# Patient Record
Sex: Female | Born: 1945 | Race: White | Hispanic: No | Marital: Married | Smoking: Former smoker
Health system: Southern US, Community
[De-identification: ages and names within clinical notes are randomized; demographics above are authoritative.]

## PROBLEM LIST (undated history)

## (undated) DIAGNOSIS — Z90710 Acquired absence of both cervix and uterus: Secondary | ICD-10-CM

## (undated) DIAGNOSIS — Z8709 Personal history of other diseases of the respiratory system: Secondary | ICD-10-CM

## (undated) DIAGNOSIS — K602 Anal fissure, unspecified: Secondary | ICD-10-CM

## (undated) DIAGNOSIS — Z8619 Personal history of other infectious and parasitic diseases: Secondary | ICD-10-CM

## (undated) DIAGNOSIS — K635 Polyp of colon: Secondary | ICD-10-CM

## (undated) DIAGNOSIS — Z87828 Personal history of other (healed) physical injury and trauma: Secondary | ICD-10-CM

## (undated) DIAGNOSIS — I499 Cardiac arrhythmia, unspecified: Secondary | ICD-10-CM

## (undated) DIAGNOSIS — K219 Gastro-esophageal reflux disease without esophagitis: Secondary | ICD-10-CM

## (undated) DIAGNOSIS — K5790 Diverticulosis of intestine, part unspecified, without perforation or abscess without bleeding: Secondary | ICD-10-CM

## (undated) DIAGNOSIS — E079 Disorder of thyroid, unspecified: Secondary | ICD-10-CM

## (undated) DIAGNOSIS — F32A Depression, unspecified: Secondary | ICD-10-CM

## (undated) DIAGNOSIS — I480 Paroxysmal atrial fibrillation: Secondary | ICD-10-CM

## (undated) DIAGNOSIS — F419 Anxiety disorder, unspecified: Secondary | ICD-10-CM

## (undated) DIAGNOSIS — G2581 Restless legs syndrome: Secondary | ICD-10-CM

## (undated) DIAGNOSIS — E039 Hypothyroidism, unspecified: Secondary | ICD-10-CM

## (undated) DIAGNOSIS — M1990 Unspecified osteoarthritis, unspecified site: Secondary | ICD-10-CM

## (undated) DIAGNOSIS — K589 Irritable bowel syndrome without diarrhea: Secondary | ICD-10-CM

## (undated) DIAGNOSIS — Z780 Asymptomatic menopausal state: Secondary | ICD-10-CM

## (undated) DIAGNOSIS — E785 Hyperlipidemia, unspecified: Secondary | ICD-10-CM

## (undated) DIAGNOSIS — R011 Cardiac murmur, unspecified: Secondary | ICD-10-CM

## (undated) DIAGNOSIS — I872 Venous insufficiency (chronic) (peripheral): Secondary | ICD-10-CM

## (undated) DIAGNOSIS — G4733 Obstructive sleep apnea (adult) (pediatric): Secondary | ICD-10-CM

## (undated) DIAGNOSIS — Z9989 Dependence on other enabling machines and devices: Secondary | ICD-10-CM

## (undated) DIAGNOSIS — I4891 Unspecified atrial fibrillation: Secondary | ICD-10-CM

## (undated) DIAGNOSIS — M8580 Other specified disorders of bone density and structure, unspecified site: Secondary | ICD-10-CM

## (undated) DIAGNOSIS — F329 Major depressive disorder, single episode, unspecified: Secondary | ICD-10-CM

## (undated) HISTORY — DX: Unspecified osteoarthritis, unspecified site: M19.90

## (undated) HISTORY — DX: Depression, unspecified: F32.A

## (undated) HISTORY — PX: ENDOMETRIAL ABLATION: SHX621

## (undated) HISTORY — DX: Dependence on other enabling machines and devices: Z99.89

## (undated) HISTORY — DX: Acquired absence of both cervix and uterus: Z90.710

## (undated) HISTORY — DX: Personal history of other (healed) physical injury and trauma: Z87.828

## (undated) HISTORY — DX: Polyp of colon: K63.5

## (undated) HISTORY — DX: Anxiety disorder, unspecified: F41.9

## (undated) HISTORY — DX: Anal fissure, unspecified: K60.2

## (undated) HISTORY — DX: Unspecified atrial fibrillation: I48.91

## (undated) HISTORY — PX: BREAST CYST ASPIRATION: SHX578

## (undated) HISTORY — DX: Gastro-esophageal reflux disease without esophagitis: K21.9

## (undated) HISTORY — DX: Disorder of thyroid, unspecified: E07.9

## (undated) HISTORY — DX: Major depressive disorder, single episode, unspecified: F32.9

## (undated) HISTORY — DX: Diverticulosis of intestine, part unspecified, without perforation or abscess without bleeding: K57.90

## (undated) HISTORY — DX: Hyperlipidemia, unspecified: E78.5

## (undated) HISTORY — DX: Obstructive sleep apnea (adult) (pediatric): G47.33

## (undated) HISTORY — PX: BREAST SURGERY: SHX581

## (undated) HISTORY — DX: Other specified disorders of bone density and structure, unspecified site: M85.80

## (undated) HISTORY — DX: Paroxysmal atrial fibrillation: I48.0

---
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
## 1982-11-21 HISTORY — PX: VAGINAL HYSTERECTOMY: SUR661

## 1983-11-22 HISTORY — PX: BREAST CYST INCISION AND DRAINAGE: SHX14

## 1986-11-21 HISTORY — PX: TREATMENT FISTULA ANAL: SUR1390

## 2000-03-13 ENCOUNTER — Encounter: Payer: Self-pay | Admitting: Internal Medicine

## 2005-11-09 ENCOUNTER — Encounter: Payer: Self-pay | Admitting: Internal Medicine

## 2005-11-10 ENCOUNTER — Encounter: Payer: Self-pay | Admitting: Internal Medicine

## 2008-11-21 HISTORY — PX: COLONOSCOPY: SHX174

## 2009-02-02 ENCOUNTER — Encounter: Payer: Self-pay | Admitting: Internal Medicine

## 2009-02-02 ENCOUNTER — Ambulatory Visit: Payer: Self-pay | Admitting: Internal Medicine

## 2009-02-02 DIAGNOSIS — I4891 Unspecified atrial fibrillation: Secondary | ICD-10-CM

## 2009-02-02 DIAGNOSIS — E785 Hyperlipidemia, unspecified: Secondary | ICD-10-CM

## 2009-02-03 ENCOUNTER — Ambulatory Visit: Payer: Self-pay | Admitting: Internal Medicine

## 2009-02-03 LAB — CONVERTED CEMR LAB
Albumin: 3.9 g/dL (ref 3.5–5.2)
Alkaline Phosphatase: 46 units/L (ref 39–117)
Cholesterol: 201 mg/dL — ABNORMAL HIGH (ref 0–200)
Direct LDL: 111.1 mg/dL
HDL: 71.2 mg/dL (ref >39.00)
TSH: 3.23 microintl units/mL (ref 0.35–5.50)
Total CHOL/HDL Ratio: 3
Total Protein: 6.9 g/dL (ref 6.0–8.3)
VLDL: 17 mg/dL (ref 0.0–40.0)

## 2009-07-21 ENCOUNTER — Encounter (INDEPENDENT_AMBULATORY_CARE_PROVIDER_SITE_OTHER): Payer: Self-pay | Admitting: *Deleted

## 2009-08-06 ENCOUNTER — Encounter: Admission: RE | Admit: 2009-08-06 | Discharge: 2009-08-06 | Payer: Self-pay | Admitting: Obstetrics and Gynecology

## 2009-09-03 ENCOUNTER — Ambulatory Visit: Payer: Self-pay | Admitting: Internal Medicine

## 2009-09-04 ENCOUNTER — Ambulatory Visit: Payer: Self-pay | Admitting: Internal Medicine

## 2009-09-17 ENCOUNTER — Ambulatory Visit: Payer: Self-pay | Admitting: Internal Medicine

## 2009-09-17 ENCOUNTER — Encounter: Payer: Self-pay | Admitting: Internal Medicine

## 2009-09-21 ENCOUNTER — Encounter: Payer: Self-pay | Admitting: Internal Medicine

## 2009-09-23 ENCOUNTER — Encounter: Admission: RE | Admit: 2009-09-23 | Discharge: 2009-09-23 | Payer: Self-pay | Admitting: Obstetrics and Gynecology

## 2009-10-23 ENCOUNTER — Encounter: Admission: RE | Admit: 2009-10-23 | Discharge: 2009-10-23 | Payer: Self-pay | Admitting: Obstetrics and Gynecology

## 2010-03-01 ENCOUNTER — Encounter: Payer: Self-pay | Admitting: Internal Medicine

## 2010-03-09 ENCOUNTER — Telehealth (INDEPENDENT_AMBULATORY_CARE_PROVIDER_SITE_OTHER): Payer: Self-pay | Admitting: *Deleted

## 2010-08-17 ENCOUNTER — Encounter: Admission: RE | Admit: 2010-08-17 | Discharge: 2010-08-17 | Payer: Self-pay | Admitting: Obstetrics and Gynecology

## 2010-09-02 ENCOUNTER — Ambulatory Visit: Payer: Self-pay | Admitting: Internal Medicine

## 2010-09-02 ENCOUNTER — Encounter: Payer: Self-pay | Admitting: Internal Medicine

## 2010-09-15 ENCOUNTER — Telehealth: Payer: Self-pay | Admitting: Internal Medicine

## 2010-09-16 ENCOUNTER — Telehealth: Payer: Self-pay | Admitting: Internal Medicine

## 2010-09-23 ENCOUNTER — Ambulatory Visit: Payer: Self-pay

## 2010-09-23 ENCOUNTER — Ambulatory Visit: Payer: Self-pay | Admitting: Internal Medicine

## 2010-12-21 NOTE — Progress Notes (Signed)
Summary: refill   Phone Note Refill Request Call back at Home Phone [phone] Message from:  Patient on September 15, 2010 9:29 AM  Refills Requested: Medication #1:  RYTHMOL SR 325 MG XR12H-CAP one by mouth bid  Medication #2:  PRAVACHOL 80 MG TABS 1/2 tab once daily Pt need a [facility] [phone] for 10 pills  of Rythmol and pt also need a new prescription for 90 day supply for 1 year can pick up prescription  when ready for [facility] not [facility] for the pt.  Initial call taken by: Judie Grieve,  September 15, 2010 9:33 AM    Prescriptions: RYTHMOL SR 325 MG XR12H-CAP (PROPAFENONE HCL) one by mouth bid  #28 x 0   Entered by:   Laurance Flatten CMA   Authorized by:   Laren Boom, MD, [facility]   Signed by:   Laurance Flatten CMA on 09/15/2010   Method used:   [facility] to        [facility]  [phone]* (retail)       [address] [place], [place]  [address]       Ph: [phone] or [phone]       Fax: [phone]   RxID:   [id number] RYTHMOL SR 325 MG XR12H-CAP (PROPAFENONE HCL) one by mouth bid  #180 x 3   Entered by:   Laurance Flatten CMA   Authorized by:   Laren Boom, MD, [facility]   Signed by:   Laurance Flatten CMA on 09/15/2010   Method used:   Print then Give to Patient   RxID:   [id number] PRAVACHOL 80 MG TABS (PRAVASTATIN SODIUM) 1/2 tab once daily  #90 x 3   Entered by:   Laurance Flatten CMA   Authorized by:   Laren Boom, MD, [facility]   Signed by:   Laurance Flatten CMA on 09/15/2010   Method used:   Print then Give to Patient   RxID:   [id number]

## 2010-12-21 NOTE — Assessment & Plan Note (Signed)
Summary: yearly/sl  Medications Added RYTHMOL SR 325 MG XR12H-CAP (PROPAFENONE HCL) one by mouth bid PRAVACHOL 80 MG TABS (PRAVASTATIN SODIUM) 1/2 tab once daily VITAMIN C CR 1000 MG CR-TABS (ASCORBIC ACID) Take 1 tablet by mouth two times a day * MELATONIN 10 MG TABS (MELATONIN) 2 tablets at bedtime      Allergies Added:   Visit Type:  Follow-up Primary Frances Smith:  None   History of Present Illness: Frances Smith returns today for followup of her atrial fibrillation and dyslipidemia.  The patient has a h/o single PVI over 10 yrs ago.  She has done reasonably well on short acting propafenone until the past few months.  The patient has had difficulty forgetting her mid-day dose of drug and has noted increasingly frequent palpitations and symptomatic atrial fib.  No syncope.  She had a CRP study and this was elevated.  No c/p or sob or peripheral edema.  No other complaints today except for arthritis in her knees.  Current Medications (verified): 1)  Propafenone Hcl 225 Mg Tabs (Propafenone Hcl) .... Take 1 Tablet By Mouth Three Times A Day 2)  Pravachol 80 Mg Tabs (Pravastatin Sodium) .... 1/2 Tab Once Daily 3)  Aspirin 81 Mg Tbec (Aspirin) .... Two Times A Day 4)  Prilosec 20 Mg Cpdr (Omeprazole) .... Take 1 Tablet By Mouth Once A Day 5)  Multivitamin .... Take 1 Tablet By Mouth Once A Day 6)  Calcium 600/vitamin D 600-400 Mg-Unit Tabs (Calcium Carbonate-Vitamin D) .... Take 1 Tablet By Mouth Once A Day 7)  Coq10 100 Mg Caps (Coenzyme Q10) .... Take 1 Tablet By Mouth Once A Day 8)  Fish Oil 1000 Mg Caps (Omega-3 Fatty Acids) .... Take 1 Capsule By Mouth Two Times A Day 9)  Cinnamon 500 Mg Caps (Cinnamon) .... Take 1 Capsule By Mouth Two Times A Day 10)  Silymarin  Caps (Milk Thistle-Turmeric) .... Take 1 Capsule By Mouth Two Times A Day 11)  Vitamin C Cr 1000 Mg Cr-Tabs (Ascorbic Acid) .... Take 1 Tablet By Mouth Two Times A Day 12)  Vitamin B-12 1000 Mcg Tabs (Cyanocobalamin) .... Take  One Tablet By Mouth Once Daily. 13)  Osteo Bi-Flex Adv Triple St  Tabs (Misc Natural Products) .... 2 Tabs in Am   1 Tab in Pm 14)  Melatonin 10 Mg Tabs (Melatonin) .... 2 Tablets At Bedtime  Allergies (verified): 1)  ! Codeine  Past History:  Past Medical History: Last updated: 02/02/2009 Paroxysmal Atrial fibrillation controlled on Propofenone h/o Ablation in 1999.  Past Surgical History: Last updated: 02/02/2009 Hysterectomy 1984 Tonsillectomy 1949  Review of Systems  The patient denies chest pain, syncope, dyspnea on exertion, and peripheral edema.    Vital Signs:  Patient profile:   65 year old female Height:      62 inches Weight:      161 pounds BMI:     29.55 Pulse rate:   62 / minute BP sitting:   122 / 80  (left arm)  Vitals Entered By: Laurance Flatten CMA (September 02, 2010 11:27 AM)  Physical Exam  General:  Well developed, well nourished, in no acute distress. Head:  normocephalic and atraumatic Eyes:  PERRLA/EOM intact; conjunctiva and lids normal. Neck:  Neck supple, no JVD. No masses, thyromegaly or abnormal cervical nodes. Lungs:  Clear bilaterally to auscultation with no wheezes, rales, or rhonchi. Heart:  RRR with normal S1 and S2.  PMI is not enlarged and laterally displaced. Abdomen:  Bowel sounds  positive; abdomen soft and non-tender without masses, organomegaly, or hernias noted. No hepatosplenomegaly. Msk:  Back normal, normal gait. Muscle strength and tone normal. Pulses:  pulses normal in all 4 extremities Extremities:  No clubbing or cyanosis. Neurologic:  Alert and oriented x 3.   EKG  Procedure date:  09/02/2010  Findings:      Normal sinus rhythm with rate of:    EKG  Procedure date:  09/02/2010  Findings:      Rate is 70 with rare PAC's.  Impression & Recommendations:  Problem # 1:  ATRIAL FIBRILLATION (ICD-427.31) Her symptoms have worsened.  I have recommended we switch to Rythmol SR two times a day and I will schedule her  to undergo exercise testing. Her updated medication list for this problem includes:    Rythmol Sr 325 Mg Xr12h-cap (Propafenone hcl) ..... One by mouth bid    Aspirin 81 Mg Tbec (Aspirin) .Marland Kitchen..Marland Kitchen Two times a day  Orders: EKG w/ Interpretation (93000)  Problem # 2:  HYPERLIPIDEMIA-MIXED (ICD-272.4) she will continue her current meds. A low fat diet is recommended. Her updated medication list for this problem includes:    Pravachol 80 Mg Tabs (Pravastatin sodium) .Marland Kitchen... 1/2 tab once daily  Patient Instructions: 1)  Your physician recommends that you schedule a follow-up appointment in: 2-3 weeks GXT with Dr Ladona Ridgel 2)  Your physician has requested that you have an exercise tolerance test.  For further information please visit [email or web address]  Please also follow instruction sheet, as given. Prescriptions: PRAVACHOL 80 MG TABS (PRAVASTATIN SODIUM) 1/2 tab once daily  #90 x 3   Entered by:   Dennis Bast, RN, BSN   Authorized by:   Laren Boom, MD, [facility]   Signed by:   Dennis Bast, RN, BSN on 09/02/2010   Method used:   Faxed to ...       [facility] education [facility])       [address] [address]       [place], [place]  [address]       Ph: [phone]       Fax: [phone]   RxID:   [id number] RYTHMOL SR 325 MG XR12H-CAP (PROPAFENONE HCL) one by mouth bid  #180 x 3   Entered by:   Dennis Bast, RN, BSN   Authorized by:   Laren Boom, MD, [facility]   Signed by:   Dennis Bast, RN, BSN on 09/02/2010   Method used:   Faxed to ...       [facility] Environmental education officer)       [address] [address]       [place], [place]  [address]       Ph: [phone]       Fax: [phone]   RxID:   [id number]

## 2010-12-21 NOTE — Progress Notes (Signed)
Summary: PT CHECKING ON WRITTEN PRESCRIPTIONS   Phone Note Call from Patient Call back at [phone]   Summary of Call: PT CHECKING HER WRITTEN PRESCRIPTIONS Initial call taken by: Judie Grieve,  September 16, 2010 1:47 PM  Follow-up for Phone Call        pt called and rx's put out front Dennis Bast, RN, BSN  September 16, 2010 4:05 PM

## 2010-12-21 NOTE — Progress Notes (Signed)
Summary: Records request from [facility]  Request for records received from [facility]. Request forwarded to [facility]. Dena Chavis  March 09, 2010 2:16 PM

## 2010-12-21 NOTE — Miscellaneous (Signed)
Summary: GXT  Clinical Lists Changes  Orders: Added new Referral order of Treadmill (Treadmill) - Signed

## 2011-07-29 ENCOUNTER — Other Ambulatory Visit: Payer: Self-pay | Admitting: Obstetrics and Gynecology

## 2011-07-29 DIAGNOSIS — Z1231 Encounter for screening mammogram for malignant neoplasm of breast: Secondary | ICD-10-CM

## 2011-08-19 ENCOUNTER — Ambulatory Visit
Admission: RE | Admit: 2011-08-19 | Discharge: 2011-08-19 | Disposition: A | Discharge status: Home / Self Care | Payer: Medicare Other | Source: Ambulatory Visit | Attending: Obstetrics and Gynecology | Admitting: Obstetrics and Gynecology

## 2011-08-19 DIAGNOSIS — Z1231 Encounter for screening mammogram for malignant neoplasm of breast: Secondary | ICD-10-CM

## 2011-09-01 ENCOUNTER — Ambulatory Visit (INDEPENDENT_AMBULATORY_CARE_PROVIDER_SITE_OTHER): Payer: Medicare Other | Admitting: Internal Medicine

## 2011-09-01 ENCOUNTER — Encounter: Payer: Self-pay | Admitting: Internal Medicine

## 2011-09-01 DIAGNOSIS — I4891 Unspecified atrial fibrillation: Secondary | ICD-10-CM

## 2011-09-01 DIAGNOSIS — R079 Chest pain, unspecified: Secondary | ICD-10-CM

## 2011-09-01 DIAGNOSIS — R0789 Other chest pain: Secondary | ICD-10-CM

## 2011-09-01 NOTE — Progress Notes (Signed)
HPI Frances Smith Returns today for followup. She is a 65 year old woman with a history of paroxysmal atrial fibrillation, atrial flutter status post catheter ablation, chronic Propafenone therapy, and hypertension. The patient notes rare palpitations lasting just a few seconds. These occur once or twice a week. The patient's main complaint today is that of chest pain with exertion. When she walks vigorously or jogs, she has noted tightness in the chest which resolves with rest. There is no associated pain with it with sedentary activity. She has not had syncope. She denies peripheral edema. Her symptoms have been present for several weeks. Allergies  Allergen Reactions  . Codeine      Current Outpatient Prescriptions  Medication Sig Dispense Refill  . Ascorbic Acid (VITAMIN C) 1000 MG tablet Take 1,000 mg by mouth daily.        Marland Kitchen aspirin 162 MG EC tablet Take 162 mg by mouth 2 (two) times daily.        . Cholecalciferol (D3-1000) 1000 UNITS capsule Take 1,000 Units by mouth daily.        . Cinnamon 500 MG capsule Take 500 mg by mouth daily.        Marland Kitchen co-enzyme Q-10 50 MG capsule Take 100 mg by mouth daily.        . cyanocobalamin 2000 MCG tablet Take 2,000 mcg by mouth daily.        . fish oil-omega-3 fatty acids 1000 MG capsule Take 2 g by mouth daily.        . Milk Thistle-Turmeric (SILYMARIN PO) Take 80 mg by mouth daily.        . Misc Natural Products (HEALTHY LIVER PO) Take 1 tablet by mouth daily.        . Multiple Vitamins-Minerals (MULTIVITAMIN WITH MINERALS) tablet Take 1 tablet by mouth daily.        . NON FORMULARY Ashwagandha 400mg  twice a day       . NON FORMULARY Gamma Amino Butric Acid 180mg  daily       . Nutritional Supplements (MELATONIN PO) Take 6 mg by mouth daily.        Marland Kitchen omeprazole (PRILOSEC) 20 MG capsule Take 20 mg by mouth daily.        . pravastatin (PRAVACHOL) 40 MG tablet Take 40 mg by mouth daily.        . propafenone (RYTHMOL SR) 325 MG 12 hr capsule Take 325 mg  by mouth 2 (two) times daily.        . Turmeric, Curcuma Longa, POWD 400 mg by Does not apply route daily.           Past Medical History  Diagnosis Date  . Paroxysmal atrial fibrillation   . H/O: hysterectomy     ROS:   All systems reviewed and negative except as noted in the HPI.   Past Surgical History  Procedure Date  . Tonsilectomy, adenoidectomy, bilateral myringotomy and tubes      Family History  Problem Relation Age of Onset  . Heart failure Father 47     History   Social History  . Marital Status: Married    Spouse Name: N/A    Number of Children: N/A  . Years of Education: N/A   Occupational History  . retired    Social History Main Topics  . Smoking status: Never Smoker   . Smokeless tobacco: Not on file  . Alcohol Use: Not on file  . Drug Use: Yes  rare ETOH  . Sexually Active: Not on file   Other Topics Concern  . Not on file   Social History Narrative  . No narrative on file     BP 143/78  Pulse 65  Ht 5' 1.5" (1.562 m)  Wt 158 lb 6.4 oz (71.85 kg)  BMI 29.44 kg/m2  Physical Exam:  Well appearing NAD HEENT: Unremarkable Neck:  No JVD, no thyromegally Lymphatics:  No adenopathy Back:  No CVA tenderness Lungs:  Clear no wheezes, rales, or rhonchi. HEART:  Regular rate rhythm, no murmurs, no rubs, no clicks Abd:  soft, positive bowel sounds, no organomegally, no rebound, no guarding Ext:  2 plus pulses, no edema, no cyanosis, no clubbing Skin:  No rashes no nodules Neuro:  CN II through XII intact, motor grossly intact  EKG Normal sinus rhythm with sinus arrhythmia. Anteroseptal ST-T wave abnormality is present.  Assess/Plan:

## 2011-09-01 NOTE — Assessment & Plan Note (Signed)
Patient is maintaining sinus rhythm very nicely on her current antiarrhythmic drug therapy.                                           I recommended she continue her current medications.

## 2011-09-01 NOTE — Patient Instructions (Signed)
Your physician wants you to follow-up in: 12 months with Dr Court Joy will receive a reminder letter in the mail two months in advance. If you don't receive a letter, please call our office to schedule the follow-up appointment.   Your physician has requested that you have en exercise stress myoview. For further information please visit [email or web address] Please follow instruction sheet, as given.

## 2011-09-01 NOTE — Assessment & Plan Note (Signed)
The patient's symptoms are typical in that they occur with exertion and resolve with rest. She has an abnormal EKG at baseline. She is on antiarrhythmic drug therapy with a class IC antiarrhythmic drug. I recommended that she undergo exercise perfusion stress testing to better evaluate her exertional chest pain.

## 2011-09-21 ENCOUNTER — Ambulatory Visit (HOSPITAL_COMMUNITY): Payer: Medicare Other | Attending: Internal Medicine | Admitting: Radiology

## 2011-09-21 VITALS — Ht 61.5 in | Wt 158.0 lb

## 2011-09-21 DIAGNOSIS — I491 Atrial premature depolarization: Secondary | ICD-10-CM

## 2011-09-21 DIAGNOSIS — I4891 Unspecified atrial fibrillation: Secondary | ICD-10-CM

## 2011-09-21 DIAGNOSIS — R0789 Other chest pain: Secondary | ICD-10-CM

## 2011-09-21 DIAGNOSIS — R079 Chest pain, unspecified: Secondary | ICD-10-CM | POA: Insufficient documentation

## 2011-09-21 MED ORDER — TECHNETIUM TC 99M TETROFOSMIN IV KIT
33.0000 | PACK | Freq: Once | INTRAVENOUS | Status: AC | PRN
  Administered 2011-09-21: 33 via INTRAVENOUS

## 2011-09-21 MED ORDER — TECHNETIUM TC 99M TETROFOSMIN IV KIT
11.0000 | PACK | Freq: Once | INTRAVENOUS | Status: AC | PRN
  Administered 2011-09-21: 11 via INTRAVENOUS

## 2011-09-21 NOTE — Progress Notes (Signed)
[facility] SITE 3 NUCLEAR MED [address] [place] [address] [phone]  Cardiology Nuclear Med Study  Frances Smith is a 65 y.o. female [id number] 09-Jul-1946   Nuclear Med Background Indication for Stress Test:  Evaluation for Ischemia History: '99 Ablation, Abnormal EKG, 11/11 GXT: NL and AFIB Cardiac Risk Factors: Hypertension and Lipids  Symptoms:  Chest Pain, Chest Tightness and Palpitations   Nuclear Pre-Procedure Caffeine/Decaff Intake:  None NPO After: 8:30pm   Lungs: clear IV 0.9% NS with Angio Cath:  22g  IV Site: L Antecubital, tolerated well IV Started by:  Irean Hong, RN  Chest Size (in):  36 Cup Size: B  Height: 5' 1.5" (1.562 m)  Weight:  158 lb (71.668 kg)  BMI:  Body mass index is 29.37 kg/(m^2). Tech Comments:  N/A    Nuclear Med Study 1 or 2 day study: 1 day  Stress Test Type:  Stress  Reading MD: Arvilla Meres, MD  Order Authorizing Provider:  Lewayne Bunting, MD  Resting Radionuclide: Technetium 86m Tetrofosmin  Resting Radionuclide Dose: 11.0 mCi   Stress Radionuclide:  Technetium 41m Tetrofosmin  Stress Radionuclide Dose: 33.0 mCi           Stress Protocol Rest HR: 59 Stress HR: 141  Rest BP: 114/78 Stress BP: 163/72  Exercise Time (min): 7:30 METS: 9.3   Predicted Max HR: 153 bpm % Max HR: 92.16 bpm Rate Pressure Product: 25366   Dose of Adenosine (mg):  n/a Dose of Lexiscan: n/a mg  Dose of Atropine (mg): n/a Dose of Dobutamine: n/a mcg/kg/min (at max HR)  Stress Test Technologist: Milana Na, EMT-P  Nuclear Technologist:  Domenic Polite, CNMT     Rest Procedure:  Myocardial perfusion imaging was performed at rest 45 minutes following the intravenous administration of Technetium 57m Tetrofosmin. Rest ECG: Sinus Bradycardia  Stress Procedure:  The patient exercised for 7:30.  The patient stopped due to fatigue and denied any chest pain.  There were no significant ST-T wave changes.  Technetium 10m  Tetrofosmin was injected at peak exercise and myocardial perfusion imaging was performed after a brief delay. Stress ECG: No significant change from baseline ECG  QPS Raw Data Images:  Normal; no motion artifact; normal heart/lung ratio. Stress Images:  Normal homogeneous uptake in all areas of the myocardium. Rest Images:  Normal homogeneous uptake in all areas of the myocardium. Subtraction (SDS):  Normal Transient Ischemic Dilatation (Normal <1.22):  0.96 Lung/Heart Ratio (Normal <0.45):  0.30  Quantitative Gated Spect Images QGS EDV:  58 ml QGS ESV:  14 ml QGS cine images:  NL LV Function; NL Wall Motion QGS EF: 77%  Impression Exercise Capacity:  Fair exercise capacity. BP Response:  Normal blood pressure response. Clinical Symptoms:  No chest pain. ECG Impression:  No significant ST segment change suggestive of ischemia. Comparison with Prior Nuclear Study: No images to compare  Overall Impression:  Normal stress nuclear study.   Lititia Sen

## 2011-09-26 ENCOUNTER — Encounter: Payer: Self-pay | Admitting: Internal Medicine

## 2011-10-04 ENCOUNTER — Telehealth: Payer: Self-pay | Admitting: Internal Medicine

## 2011-10-04 NOTE — Telephone Encounter (Signed)
Advised patient

## 2011-10-04 NOTE — Telephone Encounter (Signed)
Since stress test looks normal, what is the plan from here.  She had been having chest pains and pressure when jogging up hill, she has stopped that.  She does want to know if anything more needs to be done. Advised would forward to [employer] for him to discuss with Dr Ladona Ridgel on Friday when he is back.

## 2011-10-04 NOTE — Telephone Encounter (Signed)
Pt wants to know stress test results please call °

## 2011-10-07 NOTE — Telephone Encounter (Signed)
Stress test ok and Dr Ladona Ridgel will see her in 6-8 weeks She is in [facility] and will call us when she returns home to schedule

## 2011-10-18 DIAGNOSIS — E039 Hypothyroidism, unspecified: Secondary | ICD-10-CM | POA: Insufficient documentation

## 2011-11-25 DIAGNOSIS — I8393 Asymptomatic varicose veins of bilateral lower extremities: Secondary | ICD-10-CM | POA: Insufficient documentation

## 2011-11-25 DIAGNOSIS — M8580 Other specified disorders of bone density and structure, unspecified site: Secondary | ICD-10-CM | POA: Insufficient documentation

## 2011-11-25 DIAGNOSIS — K635 Polyp of colon: Secondary | ICD-10-CM | POA: Insufficient documentation

## 2011-11-30 ENCOUNTER — Encounter: Payer: Self-pay | Admitting: Internal Medicine

## 2011-11-30 ENCOUNTER — Ambulatory Visit (INDEPENDENT_AMBULATORY_CARE_PROVIDER_SITE_OTHER): Payer: Medicare Other | Admitting: Internal Medicine

## 2011-11-30 DIAGNOSIS — E785 Hyperlipidemia, unspecified: Secondary | ICD-10-CM

## 2011-11-30 DIAGNOSIS — I4891 Unspecified atrial fibrillation: Secondary | ICD-10-CM

## 2011-11-30 NOTE — Progress Notes (Signed)
HPI Frances Smith returns today for followup. She is a 66 year old woman with a history of recurrent and paroxysmal atrial arrhythmias. These have been well controlled with Rythmol. The patient has had very minimal palpitations in the last year. She denies chest pain or shortness of breath. She does admit to inactivity. Allergies  Allergen Reactions  . Codeine      Current Outpatient Prescriptions  Medication Sig Dispense Refill  . Ascorbic Acid (VITAMIN C) 1000 MG tablet Take 1,000 mg by mouth daily.        Marland Kitchen aspirin 162 MG EC tablet Take 162 mg by mouth 2 (two) times daily.        Marland Kitchen b complex vitamins capsule Take 1 capsule by mouth daily.      . Cholecalciferol (D3-1000) 1000 UNITS capsule Take 1,000 Units by mouth daily.        . Cinnamon 500 MG capsule Take 500 mg by mouth daily.        Marland Kitchen co-enzyme Q-10 50 MG capsule Take 100 mg by mouth daily.        . cyanocobalamin 2000 MCG tablet Take 2,000 mcg by mouth daily.        Marland Kitchen escitalopram (LEXAPRO) 20 MG tablet Take 20 mg by mouth daily.      . fish oil-omega-3 fatty acids 1000 MG capsule Take 2 g by mouth daily.        . Milk Thistle-Turmeric (SILYMARIN PO) Take 80 mg by mouth daily.        . Misc Natural Products (HEALTHY LIVER PO) Take 1 tablet by mouth daily.        . Multiple Vitamins-Minerals (MULTIVITAMIN WITH MINERALS) tablet Take 1 tablet by mouth daily.        . NON FORMULARY Ashwagandha 400mg  twice a day       . NON FORMULARY Gamma Amino Butric Acid 180mg  daily       . Nutritional Supplements (MELATONIN PO) Take 6 mg by mouth daily.        Marland Kitchen omeprazole (PRILOSEC) 20 MG capsule Take 20 mg by mouth daily.        . pravastatin (PRAVACHOL) 40 MG tablet Take 40 mg by mouth daily.        . propafenone (RYTHMOL SR) 325 MG 12 hr capsule Take 325 mg by mouth 2 (two) times daily.        . Turmeric, Curcuma Longa, POWD 400 mg by Does not apply route daily.           Past Medical History  Diagnosis Date  . Paroxysmal atrial  fibrillation   . H/O: hysterectomy     ROS:   All systems reviewed and negative except as noted in the HPI.   Past Surgical History  Procedure Date  . Tonsilectomy, adenoidectomy, bilateral myringotomy and tubes      Family History  Problem Relation Age of Onset  . Heart failure Father 10     History   Social History  . Marital Status: Married    Spouse Name: N/A    Number of Children: N/A  . Years of Education: N/A   Occupational History  . retired    Social History Main Topics  . Smoking status: Never Smoker   . Smokeless tobacco: Not on file  . Alcohol Use: Not on file  . Drug Use: Yes     rare ETOH  . Sexually Active: Not on file   Other Topics Concern  . Not on  file   Social History Narrative  . No narrative on file     BP 112/74  Pulse 64  Ht 5' 1.5" (1.562 m)  Wt 74.118 kg (163 lb 6.4 oz)  BMI 30.37 kg/m2  Physical Exam:  Well appearing middle aged woman, NAD HEENT: Unremarkable Neck:  No JVD, no thyromegally Lungs:  Clear with no wheezes, rales, or rhonchi. HEART:  Regular rate rhythm, no murmurs, no rubs, no clicks Abd:  soft, positive bowel sounds, no organomegally, no rebound, no guarding Ext:  2 plus pulses, no edema, no cyanosis, no clubbing Skin:  No rashes no nodules Neuro:  CN II through XII intact, motor grossly intact    Assess/Plan:

## 2011-11-30 NOTE — Assessment & Plan Note (Signed)
She will continue her statin therapy and maintain a low-fat diet. I have encouraged her to increase her physical activity

## 2011-11-30 NOTE — Patient Instructions (Signed)
Your physician wants you to follow-up in: 12 months with Dr. Taylor. You will receive a reminder letter in the mail two months in advance. If you don't receive a letter, please call our office to schedule the follow-up appointment.    

## 2011-11-30 NOTE — Assessment & Plan Note (Signed)
Her symptoms have been well controlled with Rythmol. She will continue her current medical therapy.

## 2012-06-01 ENCOUNTER — Other Ambulatory Visit: Payer: Self-pay | Admitting: Obstetrics and Gynecology

## 2012-06-01 DIAGNOSIS — Z1231 Encounter for screening mammogram for malignant neoplasm of breast: Secondary | ICD-10-CM

## 2012-06-04 ENCOUNTER — Other Ambulatory Visit: Payer: Self-pay | Admitting: Family Medicine

## 2012-06-04 DIAGNOSIS — M8580 Other specified disorders of bone density and structure, unspecified site: Secondary | ICD-10-CM

## 2012-06-22 ENCOUNTER — Other Ambulatory Visit: Payer: Medicare Other

## 2012-06-28 ENCOUNTER — Ambulatory Visit
Admission: RE | Admit: 2012-06-28 | Discharge: 2012-06-28 | Disposition: A | Discharge status: Home / Self Care | Payer: Medicare Other | Source: Ambulatory Visit | Attending: Family Medicine | Admitting: Family Medicine

## 2012-06-28 DIAGNOSIS — M8580 Other specified disorders of bone density and structure, unspecified site: Secondary | ICD-10-CM

## 2012-07-03 ENCOUNTER — Encounter: Payer: Self-pay | Admitting: *Deleted

## 2012-07-04 ENCOUNTER — Encounter: Payer: Self-pay | Admitting: Pulmonary Disease

## 2012-07-04 ENCOUNTER — Ambulatory Visit (INDEPENDENT_AMBULATORY_CARE_PROVIDER_SITE_OTHER): Payer: Medicare Other | Admitting: Pulmonary Disease

## 2012-07-04 VITALS — BP 118/82 | HR 88 | Temp 99.3°F | Ht 61.0 in | Wt 161.6 lb

## 2012-07-04 DIAGNOSIS — G4733 Obstructive sleep apnea (adult) (pediatric): Secondary | ICD-10-CM

## 2012-07-04 NOTE — Progress Notes (Signed)
  Subjective:    Patient ID: Frances Smith, female    DOB: 01-Jul-1946, 66 y.o.   MRN: [id number]  HPI The patient is a 66 year old female who had been asked to see for possible obstructive sleep apnea.  She states that she has been snoring for over 10 years, but most recently it has been getting progressively worse.  It has gotten to the point that her husband has moved to a different bedroom.  Prior to this, he has not commented on an abnormal breathing pattern during sleep.  However, the patient has noticed one episode of a gasping arousal most recently.  She has frequent awakenings during the night, and feels tired upon arising in the mornings.  She does have some sleep pressured during the day with any activity, and can easily take an afternoon nap.  She also notes sleep pressure in the evening if she sits down for any period of time.  She denies any sleepiness with driving.  The patient states that her weight is up about 10 pounds over the last few years, and her Epworth sleepiness score today is abnormal at 14.  Sleep Questionnaire: What time do you typically go to bed?( Between what hours) 10-11:30 pm How long does it take you to fall asleep? varies but generally 1-2 hours How many times during the night do you wake up? 2 What time do you get out of bed to start your day? 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 (In pounds) 10 lb (4.536 kg) Have you ever had a sleep study before? No Do you currently use CPAP? No Do you wear oxygen at any time? No    Review of Systems  Constitutional: Negative for fever and unexpected weight change.  HENT: Positive for sneezing and postnasal drip. Negative for ear pain, nosebleeds, congestion, sore throat, rhinorrhea, trouble swallowing, dental problem and sinus pressure.   Eyes: Negative for redness and itching.  Respiratory: Positive for cough. Negative for chest  tightness, shortness of breath and wheezing.   Cardiovascular: Positive for palpitations and leg swelling.  Gastrointestinal: Negative for nausea and vomiting.  Genitourinary: Negative for dysuria.  Musculoskeletal: Positive for joint swelling.  Skin: Negative for rash.  Neurological: Negative for headaches.  Hematological: Bruises/bleeds easily.  Psychiatric/Behavioral: Negative for dysphoric mood. The patient is nervous/anxious.        Objective:   Physical Exam Constitutional:  Well developed, no acute distress  HENT:  Nares patent without discharge, septal deviation to left.   Oropharynx without exudate, palate and uvula are mildly elongated.   Eyes:  Perrla, eomi, no scleral icterus  Neck:  No JVD, no TMG  Cardiovascular:  Normal rate, regular rhythm, no rubs or gallops. 1/6 sem        Intact distal pulses  Pulmonary :  Normal breath sounds, no stridor or respiratory distress   No rales, rhonchi, or wheezing  Abdominal:  Soft, nondistended, bowel sounds present.  No tenderness noted.   Musculoskeletal:  No lower extremity edema noted.  Lymph Nodes:  No cervical lymphadenopathy noted  Skin:  No cyanosis noted  Neurologic:  Alert, appropriate, moves all 4 extremities without obvious deficit.         Assessment & Plan:

## 2012-07-04 NOTE — Assessment & Plan Note (Signed)
The patient is complaining of progressive snoring over the years, and now gives a history that suggests some degree of sleep disordered breathing.  I've had a long discussion with her about sleep apnea, including its impact to her quality of life and cardiovascular health.  Given her worsening symptoms, I have recommended proceeding with a sleep study for diagnosis.  The patient is agreeable.

## 2012-07-04 NOTE — Patient Instructions (Addendum)
Will schedule for a sleep study, and arrange followup once the results are available.  

## 2012-07-13 DIAGNOSIS — M25561 Pain in right knee: Secondary | ICD-10-CM | POA: Insufficient documentation

## 2012-07-18 ENCOUNTER — Ambulatory Visit (HOSPITAL_BASED_OUTPATIENT_CLINIC_OR_DEPARTMENT_OTHER): Payer: Medicare Other | Attending: Pulmonary Disease | Admitting: Radiology

## 2012-07-18 VITALS — Ht 61.0 in | Wt 159.0 lb

## 2012-07-18 DIAGNOSIS — G4730 Sleep apnea, unspecified: Secondary | ICD-10-CM | POA: Insufficient documentation

## 2012-07-18 DIAGNOSIS — G4733 Obstructive sleep apnea (adult) (pediatric): Secondary | ICD-10-CM

## 2012-07-18 DIAGNOSIS — G4710 Hypersomnia, unspecified: Secondary | ICD-10-CM | POA: Insufficient documentation

## 2012-07-25 DIAGNOSIS — G4710 Hypersomnia, unspecified: Secondary | ICD-10-CM

## 2012-07-25 DIAGNOSIS — G4730 Sleep apnea, unspecified: Secondary | ICD-10-CM

## 2012-07-26 ENCOUNTER — Telehealth: Payer: Self-pay | Admitting: Pulmonary Disease

## 2012-07-26 NOTE — Telephone Encounter (Signed)
Appt set to review sleep results on 07-30-12. Carron Curie, CMA

## 2012-07-26 NOTE — Procedures (Signed)
Frances Smith, Frances Smith                ACCOUNT NO.:  [id number]  MEDICAL RECORD NO.:  [id number]          PATIENT TYPE:  OUT  LOCATION:  SLEEP CENTER                 FACILITY:  [facility]  PHYSICIAN:  Barbaraann Share, MD,FCCPDATE OF BIRTH:  1946-08-[birth year]  DATE OF STUDY:  07/18/2012                           NOCTURNAL POLYSOMNOGRAM  REFERRING PHYSICIAN:  Barbaraann Share, MD,FCCP  INDICATION FOR STUDY:  Hypersomnia with sleep apnea.  EPWORTH SLEEPINESS SCORE:  12.  MEDICATIONS:  SLEEP ARCHITECTURE:  The patient had a total sleep time of 209 minutes with no slow-wave sleep and only 11 minutes of REM.  Sleep onset latency was prolonged at 86 minutes and REM onset was prolonged at 130 minutes. Sleep efficiency was poor at 51%.  RESPIRATORY DATA:  The patient was found to have no obstructive apneas or hypopneas, and only mild snoring was noted.  OXYGEN DATA:  There was transient O2 desaturation as low as 90% during the night.  CARDIAC DATA:  No clinically significant arrhythmias were noted.  MOVEMENT-PARASOMNIA:  The patient had 366 periodic limb movements, with 6 per hour resulting in arousal or awakening.  There were no abnormal behaviors noted.  IMPRESSIONS-RECOMMENDATIONS: 1. No clinically significant sleep-disordered breathing was noted     during the study.  The patient did have a short total sleep time     and very little slow-wave sleep or REM, however she had ample     opportunity to exhibit sleep-disordered breathing. 2. Large numbers of periodic limb movements with significant sleep     disruption.  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    Barbaraann Share, MD,FCCP Diplomate, American Board of Sleep Medicine   [facility]  D:  07/25/2012 08:29:26  T:  07/26/2012 01:09:37  Job:  [id number]

## 2012-07-30 ENCOUNTER — Ambulatory Visit (INDEPENDENT_AMBULATORY_CARE_PROVIDER_SITE_OTHER): Payer: Medicare Other | Admitting: Pulmonary Disease

## 2012-07-30 ENCOUNTER — Encounter: Payer: Self-pay | Admitting: Pulmonary Disease

## 2012-07-30 VITALS — BP 122/72 | HR 65 | Ht 61.0 in | Wt 160.8 lb

## 2012-07-30 DIAGNOSIS — G2581 Restless legs syndrome: Secondary | ICD-10-CM

## 2012-07-30 MED ORDER — ROPINIROLE HCL 0.5 MG PO TABS: 0.5000 mg | ORAL_TABLET | Freq: Three times a day (TID) | ORAL | Status: DC

## 2012-07-30 NOTE — Assessment & Plan Note (Signed)
The patient's sleep study shows very large numbers of limb movements with significant sleep disruption.  On further questioning, the patient does have kicking at night, and describes classic RLS symptoms in the evening.  This may be responsible for her frequent awakenings at night, and some of her symptoms during the day.  I would like to try her on a dopamine agonist to see if it helps, and she is agreeable.

## 2012-07-30 NOTE — Progress Notes (Signed)
  Subjective:    Patient ID: Frances Smith, female    DOB: June 27, 1946, 66 y.o.   MRN: [id number]  HPI The patient comes in today for followup after her recent sleep study.  She was found to have no apneas or hypopneas, and only mild snoring.  However, she was found to have 366 periodic limb movements, with 6 per hour resulting in arousals or awakenings.  The patient has been told that she does kick during the night, and also has classic restless leg symptoms in the evenings before going to bed.  She has had this since early adulthood.   Review of Systems  Constitutional: Negative for fever and unexpected weight change.  HENT: Positive for congestion, rhinorrhea and sinus pressure. Negative for ear pain, nosebleeds, sore throat, sneezing, trouble swallowing, dental problem and postnasal drip.   Eyes: Negative for redness and itching.  Respiratory: Positive for cough. Negative for chest tightness, shortness of breath and wheezing.   Cardiovascular: Negative for palpitations and leg swelling.  Gastrointestinal: Negative for nausea and vomiting.  Genitourinary: Negative for dysuria.  Musculoskeletal: Negative for joint swelling.  Skin: Negative for rash.  Neurological: Negative for headaches.  Hematological: Bruises/bleeds easily.  Psychiatric/Behavioral: Positive for dysphoric mood. The patient is nervous/anxious.        Objective:   Physical Exam Well-developed female in no acute distress Nose without purulent or discharge noted Lower extremities without edema, no cyanosis Alert, does not appear to be sleepy, moves all 4 extremities.       Assessment & Plan:

## 2012-07-30 NOTE — Patient Instructions (Addendum)
Will start on requip 0.5mg  one after dinner each night.  After a week, if not better or if only partially better, can increase to 2 after dinner. Please call me in 3 weeks with your response to therapy. Work on weight loss, stay off back as much as possible for your snoring.

## 2012-08-22 ENCOUNTER — Ambulatory Visit
Admission: RE | Admit: 2012-08-22 | Discharge: 2012-08-22 | Disposition: A | Discharge status: Home / Self Care | Payer: Medicare Other | Source: Ambulatory Visit | Attending: Obstetrics and Gynecology | Admitting: Obstetrics and Gynecology

## 2012-08-22 DIAGNOSIS — Z1231 Encounter for screening mammogram for malignant neoplasm of breast: Secondary | ICD-10-CM

## 2012-08-24 DIAGNOSIS — F411 Generalized anxiety disorder: Secondary | ICD-10-CM | POA: Insufficient documentation

## 2012-08-28 ENCOUNTER — Other Ambulatory Visit: Payer: Self-pay | Admitting: Internal Medicine

## 2012-11-05 ENCOUNTER — Other Ambulatory Visit: Payer: Self-pay | Admitting: Dermatology

## 2012-11-20 ENCOUNTER — Encounter: Payer: Self-pay | Admitting: Internal Medicine

## 2012-11-20 ENCOUNTER — Ambulatory Visit (INDEPENDENT_AMBULATORY_CARE_PROVIDER_SITE_OTHER): Payer: Medicare Other | Admitting: Internal Medicine

## 2012-11-20 VITALS — BP 128/66 | HR 72 | Ht 61.0 in | Wt 171.0 lb

## 2012-11-20 DIAGNOSIS — I4891 Unspecified atrial fibrillation: Secondary | ICD-10-CM

## 2012-11-20 NOTE — Patient Instructions (Signed)
Your physician wants you to follow-up in: 6 months with Dr Taylor You will receive a reminder letter in the mail two months in advance. If you don't receive a letter, please call our office to schedule the follow-up appointment.  

## 2012-11-21 ENCOUNTER — Encounter: Payer: Self-pay | Admitting: Internal Medicine

## 2012-11-21 NOTE — Progress Notes (Signed)
HPI Frances Smith returns today for followup. She is a pleasant 67 yo woman with a h/o atrial flutter, s/p ablation, PAF which has been well controlled on Propafenone. She notes one episode of sustained palpitations several days ago after taking a tablespoon of Tumeric. Other than that she has had no chest pain or sob. She has had problems with arthritis and has been unable to exercise and has gained 30 lbs. Allergies  Allergen Reactions  . Codeine      Current Outpatient Prescriptions  Medication Sig Dispense Refill  . Ascorbic Acid (VITAMIN C) 1000 MG tablet Take 1,000 mg by mouth daily.        Marland Kitchen aspirin 162 MG EC tablet Take 162 mg by mouth 2 (two) times daily.        Marland Kitchen b complex vitamins capsule Take 1 capsule by mouth daily.      . Calcium-Vitamin D 600-125 MG-UNIT TABS Take 2 tablets by mouth 2 (two) times daily.      . Cholecalciferol (D3-1000) 1000 UNITS capsule Take 2,000 Units by mouth daily.       . Cinnamon 500 MG capsule Take 500 mg by mouth daily.        Marland Kitchen co-enzyme Q-10 50 MG capsule Take 100 mg by mouth daily.        Marland Kitchen escitalopram (LEXAPRO) 20 MG tablet Take 20 mg by mouth daily.      . fish oil-omega-3 fatty acids 1000 MG capsule Take 2 g by mouth daily.        . Melatonin 1 MG TABS Take 6 mg by mouth daily.      . Misc Natural Products (HEALTHY LIVER PO) Take 1 tablet by mouth daily.        . Misc Natural Products (OSTEO BI-FLEX ADV JOINT SHIELD PO) Take 2 tablets by mouth 2 (two) times daily.      . Multiple Vitamins-Minerals (MULTIVITAMIN WITH MINERALS) tablet Take 1 tablet by mouth daily.        . pantoprazole (PROTONIX) 40 MG tablet Take 40 mg by mouth daily.      . pravastatin (PRAVACHOL) 40 MG tablet Take 40 mg by mouth daily.        Marland Kitchen rOPINIRole (REQUIP) 1 MG tablet Take 1 mg by mouth at bedtime.      Marland Kitchen RYTHMOL SR 325 MG 12 hr capsule TAKE 1 CAPSULE BY MOUTH EVERY 12 HOURS  180 capsule  3  . thyroid (ARMOUR) 30 MG tablet Take 30 mg by mouth daily.      . Turmeric  500 MG CAPS Take 1 capsule by mouth daily.         Past Medical History  Diagnosis Date  . Paroxysmal atrial fibrillation   . H/O: hysterectomy   . Anxiety   . Depression   . Thyroid disease   . Hyperlipidemia   . GERD (gastroesophageal reflux disease)   . History of head injury     ROS:   All systems reviewed and negative except as noted in the HPI.   Past Surgical History  Procedure Date  . Tonsillectomy   . Adenoidectomy   . Endometrial ablation   . Colonoscopy   . Vaginal hysterectomy   . Incision and drainage abscess anal      Family History  Problem Relation Age of Onset  . Heart failure Father 43  . Thyroid disease Brother   . Anxiety disorder Daughter   . Depression Daughter   . Anxiety  disorder Son   . Depression Son   . Anxiety disorder Brother   . Allergies Mother   . Asthma Mother   . Breast cancer Mother      History   Social History  . Marital Status: Married    Spouse Name: N/A    Number of Children: 2  . Years of Education: N/A   Occupational History  . retired     Charity fundraiser, Teacher, adult education   Social History Main Topics  . Smoking status: Former Smoker -- 0.5 packs/day for 4 years    Types: Cigarettes    Quit date: 11/21/1964  . Smokeless tobacco: Not on file  . Alcohol Use: No     Comment: rare ETOH  . Drug Use: No  . Sexually Active: Not on file   Other Topics Concern  . Not on file   Social History Narrative  . No narrative on file     BP 128/66  Pulse 72  Ht 5\' 1"  (1.549 m)  Wt 171 lb (77.565 kg)  BMI 32.31 kg/m2  Physical Exam:  Well appearing middle age woman, NAD HEENT: Unremarkable Neck:  7 cm JVD, no thyromegally Lungs:  Clear with no wheezes HEART:  Regular rate rhythm, no murmurs, no rubs, no clicks Abd:  soft, positive bowel sounds, no organomegally, no rebound, no guarding Ext:  2 plus pulses, no edema, no cyanosis, no clubbing Skin:  No rashes no nodules Neuro:  CN II through XII intact, motor  grossly intact  EKG NSR with IRBBB  Assess/Plan:

## 2012-11-21 NOTE — Assessment & Plan Note (Signed)
She appears to be maintaining NSR. No change in medical therapy at this time.

## 2012-12-07 NOTE — Addendum Note (Signed)
Addended by: Loma Dubuque, [place] N on: 12/07/2012 04:22 PM   Modules accepted: Orders

## 2013-02-27 DIAGNOSIS — M810 Age-related osteoporosis without current pathological fracture: Secondary | ICD-10-CM | POA: Insufficient documentation

## 2013-07-06 ENCOUNTER — Other Ambulatory Visit: Payer: Self-pay | Admitting: Internal Medicine

## 2013-08-28 ENCOUNTER — Other Ambulatory Visit: Payer: Self-pay

## 2013-08-28 DIAGNOSIS — Z1231 Encounter for screening mammogram for malignant neoplasm of breast: Secondary | ICD-10-CM

## 2013-09-10 ENCOUNTER — Ambulatory Visit
Admission: RE | Admit: 2013-09-10 | Discharge: 2013-09-10 | Disposition: A | Discharge status: Home / Self Care | Payer: Medicare Other | Source: Ambulatory Visit

## 2013-09-10 DIAGNOSIS — Z1231 Encounter for screening mammogram for malignant neoplasm of breast: Secondary | ICD-10-CM

## 2013-09-19 ENCOUNTER — Telehealth: Payer: Self-pay | Admitting: Internal Medicine

## 2013-09-19 NOTE — Telephone Encounter (Signed)
New message    Pt has been in and out of afib.  She made an appt for tues---but wanted to talk to the nurse to see if she should be seen sooner

## 2013-09-19 NOTE — Telephone Encounter (Signed)
Spoke with patient and she has been on her medication for 13-14 years and had a break through last night but had missed her morning dose of Rythmol.  She is back is rhythm now and feels fine.  She will keep her appointment on Tues as schedule

## 2013-09-24 ENCOUNTER — Ambulatory Visit (INDEPENDENT_AMBULATORY_CARE_PROVIDER_SITE_OTHER): Payer: Medicare Other | Admitting: Internal Medicine

## 2013-09-24 ENCOUNTER — Encounter: Payer: Self-pay | Admitting: Internal Medicine

## 2013-09-24 VITALS — BP 108/80 | HR 65 | Ht 61.0 in | Wt 171.4 lb

## 2013-09-24 DIAGNOSIS — I4891 Unspecified atrial fibrillation: Secondary | ICD-10-CM

## 2013-09-24 NOTE — Progress Notes (Signed)
HPI Frances Smith returns today for followup. She is a pleasant 67 yo woman with a h/o atrial flutter, s/p ablation, PAF which has been well controlled on Propafenone. She notes one episode of sustained palpitations several days ago and lasting over 2 hours. Other than that she has had no chest pain or sob. She has had problems with arthritis and has been unable to exercise and has gained 30 lbs. She did swim this summer and notes that she is traveling to [place] in approx. 3 months. Allergies  Allergen Reactions  . Codeine      Current Outpatient Prescriptions  Medication Sig Dispense Refill  . acetaminophen (TYLENOL) 325 MG tablet Take 650 mg by mouth as needed.      . Ascorbic Acid (VITAMIN C) 1000 MG tablet Take 1,000 mg by mouth daily.        Marland Kitchen aspirin 81 MG tablet Take 81 mg by mouth 2 (two) times daily.      . Calcium-Vitamin D 600-125 MG-UNIT TABS Take 2 tablets by mouth 2 (two) times daily.      . Cholecalciferol (D3-1000) 1000 UNITS capsule Take 2,000 Units by mouth daily.       Marland Kitchen co-enzyme Q-10 50 MG capsule Take 100 mg by mouth daily.        Marland Kitchen escitalopram (LEXAPRO) 20 MG tablet Take 20 mg by mouth daily.      Marland Kitchen esomeprazole (NEXIUM) 20 MG capsule Take 20 mg by mouth daily at 12 noon.      . fish oil-omega-3 fatty acids 1000 MG capsule Take 2 g by mouth daily.        . Misc Natural Products (HEALTHY LIVER PO) Take 1 tablet by mouth daily.        . Misc Natural Products (OSTEO BI-FLEX ADV JOINT SHIELD PO) Take 2 tablets by mouth 2 (two) times daily.      . Multiple Vitamins-Minerals (MULTIVITAMIN WITH MINERALS) tablet Take 1 tablet by mouth daily.        . pravastatin (PRAVACHOL) 40 MG tablet Take 40 mg by mouth daily.        Marland Kitchen rOPINIRole (REQUIP) 1 MG tablet Take 1 mg by mouth at bedtime.      Marland Kitchen RYTHMOL SR 325 MG 12 hr capsule TAKE 1 CAPSULE EVERY 12 HOURS  180 capsule  2  . thyroid (ARMOUR) 30 MG tablet Take 30 mg by mouth daily.      . Turmeric 500 MG CAPS Take 1 capsule by  mouth daily.       No current facility-administered medications for this visit.     Past Medical History  Diagnosis Date  . Paroxysmal atrial fibrillation   . H/O: hysterectomy   . Anxiety   . Depression   . Thyroid disease   . Hyperlipidemia   . GERD (gastroesophageal reflux disease)   . History of head injury     ROS:   All systems reviewed and negative except as noted in the HPI.   Past Surgical History  Procedure Laterality Date  . Tonsillectomy    . Adenoidectomy    . Endometrial ablation    . Colonoscopy    . Vaginal hysterectomy    . Incision and drainage abscess anal       Family History  Problem Relation Age of Onset  . Heart failure Father 63  . Thyroid disease Brother   . Anxiety disorder Daughter   . Depression Daughter   . Anxiety disorder Son   .  Depression Son   . Anxiety disorder Brother   . Allergies Mother   . Asthma Mother   . Breast cancer Mother      History   Social History  . Marital Status: Married    Spouse Name: N/A    Number of Children: 2  . Years of Education: N/A   Occupational History  . retired     Charity fundraiser, Teacher, adult education   Social History Main Topics  . Smoking status: Former Smoker -- 0.50 packs/day for 4 years    Types: Cigarettes    Quit date: 11/21/1964  . Smokeless tobacco: Not on file  . Alcohol Use: No     Comment: rare ETOH  . Drug Use: No  . Sexual Activity: Not on file   Other Topics Concern  . Not on file   Social History Narrative  . No narrative on file     BP 108/80  Pulse 65  Ht 5\' 1"  (1.549 m)  Wt 171 lb 6.4 oz (77.747 kg)  BMI 32.40 kg/m2  Physical Exam:  Well appearing middle age woman, NAD HEENT: Unremarkable Neck:  7 cm JVD, no thyromegally Lungs:  Clear with no wheezes HEART:  Regular rate rhythm, no murmurs, no rubs, no clicks Abd:  soft, positive bowel sounds, no organomegally, no rebound, no guarding Ext:  2 plus pulses, no edema, no cyanosis, no clubbing Skin:  No  rashes no nodules Neuro:  CN II through XII intact, motor grossly intact  EKG NSR with IRBBB  Assess/Plan:

## 2013-09-24 NOTE — Patient Instructions (Signed)
Your physician wants you to follow-up in: months with 12 months Dr Court Joy will receive a reminder letter in the mail two months in advance. If you don't receive a letter, please call our office to schedule the follow-up appointment.

## 2013-09-24 NOTE — Assessment & Plan Note (Signed)
She has had one breakthrough of atrial fibrillation since I saw her last. I've asked the patient to try to avoid caffeine, and not missing any of her medications. The current episode was associated with missing medication. We could increase her dose of Rythmol if needed.

## 2014-02-26 ENCOUNTER — Other Ambulatory Visit: Payer: Self-pay

## 2014-02-26 ENCOUNTER — Other Ambulatory Visit: Payer: Self-pay | Admitting: Internal Medicine

## 2014-07-10 ENCOUNTER — Encounter: Payer: Self-pay | Admitting: Internal Medicine

## 2014-07-20 ENCOUNTER — Other Ambulatory Visit: Payer: Self-pay | Admitting: Internal Medicine

## 2014-07-24 DIAGNOSIS — F5101 Primary insomnia: Secondary | ICD-10-CM | POA: Insufficient documentation

## 2014-07-25 ENCOUNTER — Encounter: Payer: Self-pay | Admitting: Internal Medicine

## 2014-08-11 ENCOUNTER — Other Ambulatory Visit: Payer: Self-pay

## 2014-08-11 DIAGNOSIS — Z1231 Encounter for screening mammogram for malignant neoplasm of breast: Secondary | ICD-10-CM

## 2014-09-11 ENCOUNTER — Ambulatory Visit (AMBULATORY_SURGERY_CENTER): Payer: Self-pay | Admitting: *Deleted

## 2014-09-11 VITALS — Ht 61.0 in | Wt 181.4 lb

## 2014-09-11 DIAGNOSIS — Z86010 Personal history of colonic polyps: Secondary | ICD-10-CM

## 2014-09-11 MED ORDER — MOVIPREP 100 G PO SOLR: ORAL | Status: DC

## 2014-09-11 NOTE — Progress Notes (Signed)
No allergies to eggs or soy. No problems with anesthesia.  Pt given Emmi instructions for colonoscopy  No oxygen use  No diet drug use  

## 2014-09-12 ENCOUNTER — Ambulatory Visit
Admission: RE | Admit: 2014-09-12 | Discharge: 2014-09-12 | Disposition: A | Discharge status: Home / Self Care | Payer: Medicare Other | Source: Ambulatory Visit

## 2014-09-12 DIAGNOSIS — Z1231 Encounter for screening mammogram for malignant neoplasm of breast: Secondary | ICD-10-CM

## 2014-09-24 ENCOUNTER — Encounter: Payer: Self-pay | Admitting: Internal Medicine

## 2014-09-24 ENCOUNTER — Ambulatory Visit (AMBULATORY_SURGERY_CENTER): Payer: Medicare Other | Admitting: Internal Medicine

## 2014-09-24 VITALS — BP 118/62 | HR 62 | Temp 97.2°F | Resp 16 | Ht 60.0 in | Wt 181.0 lb

## 2014-09-24 DIAGNOSIS — Z86010 Personal history of colonic polyps: Secondary | ICD-10-CM

## 2014-09-24 MED ORDER — SODIUM CHLORIDE 0.9 % IV SOLN: 500.0000 mL | INTRAVENOUS | Status: DC

## 2014-09-24 MED ORDER — HYDROCORTISONE ACETATE 25 MG RE SUPP: RECTAL | Status: AC

## 2014-09-24 NOTE — Progress Notes (Signed)
Patient awakening,vss,report to rn 

## 2014-09-24 NOTE — Patient Instructions (Addendum)
YOU HAD AN ENDOSCOPIC PROCEDURE TODAY AT THE [facility]: Refer to the procedure report that was given to you for any specific questions about what was found during the examination.  If the procedure report does not answer your questions, please call your gastroenterologist to clarify.  If you requested that your care partner not be given the details of your procedure findings, then the procedure report has been included in a sealed envelope for you to review at your convenience later.  YOU SHOULD EXPECT: Some feelings of bloating in the abdomen. Passage of more gas than usual.  Walking can help get rid of the air that was put into your GI tract during the procedure and reduce the bloating. If you had a lower endoscopy (such as a colonoscopy or flexible sigmoidoscopy) you may notice spotting of blood in your stool or on the toilet paper. If you underwent a bowel prep for your procedure, then you may not have a normal bowel movement for a few days.  DIET: Your first meal following the procedure should be a light meal and then it is ok to progress to your normal diet.  A half-sandwich or bowl of soup is an example of a good first meal.  Heavy or fried foods are harder to digest and may make you feel nauseous or bloated.  Likewise meals heavy in dairy and vegetables can cause extra gas to form and this can also increase the bloating.  Drink plenty of fluids but you should avoid alcoholic beverages for 24 hours.  ACTIVITY: Your care partner should take you home directly after the procedure.  You should plan to take it easy, moving slowly for the rest of the day.  You can resume normal activity the day after the procedure however you should NOT DRIVE or use heavy machinery for 24 hours (because of the sedation medicines used during the test).    SYMPTOMS TO REPORT IMMEDIATELY: A gastroenterologist can be reached at any hour.  During normal business hours, 8:30 AM to 5:00 PM Monday through Friday,  call [phone].  After hours and on weekends, please call the GI answering service at [phone] who will take a message and have the physician on call contact you.   Following lower endoscopy (colonoscopy or flexible sigmoidoscopy):  Excessive amounts of blood in the stool  Significant tenderness or worsening of abdominal pains  Swelling of the abdomen that is new, acute  Fever of 100F or higher  FOLLOW UP: If any biopsies were taken you will be contacted by phone or by letter within the next 1-3 weeks.  Call your gastroenterologist if you have not heard about the biopsies in 3 weeks.  Our staff will call the home number listed on your records the next business day following your procedure to check on you and address any questions or concerns that you may have at that time regarding the information given to you following your procedure. This is a courtesy call and so if there is no answer at the home number and we have not heard from you through the emergency physician on call, we will assume that you have returned to your regular daily activities without incident.  SIGNATURES/CONFIDENTIALITY: You and/or your care partner have signed paperwork which will be entered into your electronic medical record.  These signatures attest to the fact that that the information above on your After Visit Summary has been reviewed and is understood.  Full responsibility of the confidentiality of this   discharge information lies with you and/or your care-partner.  Resume medications. Information given on diverticulosis and high fiber diet with discharge instructions. 

## 2014-09-24 NOTE — Op Note (Signed)
[place]  [address] [place], [id number]   COLONOSCOPY PROCEDURE REPORT  PATIENT: Frances Smith, Frances Smith  MR#: [id number] BIRTHDATE: March 17, 1946 , 25  yrs. old GENDER: female ENDOSCOPIST: Eustace Quail, MD REFERRED YO:[id number] Program Recall PROCEDURE DATE:  09/24/2014 PROCEDURE:   Colonoscopy, surveillance First Screening Colonoscopy - Avg.  risk and is 50 yrs.  old or older - No.  Prior Negative Screening - Now for repeat screening. N/A  History of Adenoma - Now for follow-up colonoscopy & has been > or = to 3 yrs.  Yes hx of adenoma.  Has been 3 or more years since last colonoscopy.  Polyps Removed Today? No.  Recommend repeat exam, <10 yrs? No. ASA CLASS:   Class II INDICATIONS:surveillance colonoscopy based on a history of adenomatous colonic polyp(s).   Index exam 08-2009 (small TA) MEDICATIONS: Monitored anesthesia care and Propofol 250 mg IV  DESCRIPTION OF PROCEDURE:   After the risks benefits and alternatives of the procedure were thoroughly explained, informed consent was obtained.  The digital rectal exam revealed no abnormalities of the rectum.   The [id number]  endoscope was introduced through the anus and advanced to the cecum, which was identified by both the appendix and ileocecal valve. No adverse events experienced.   The quality of the prep was excellent, using MoviPrep  The instrument was then slowly withdrawn as the colon was fully examined.      COLON FINDINGS: There was mild diverticulosis noted in the sigmoid colon.   The examination was otherwise normal.  Retroflexed views revealed internal hemorrhoids. The time to cecum=1 minutes 35 seconds.  Withdrawal time=6 minutes 53 seconds.  The scope was withdrawn and the procedure completed.  COMPLICATIONS: There were no immediate complications.  ENDOSCOPIC IMPRESSION: 1.   Mild diverticulosis was noted in the sigmoid colon 2.   The examination was otherwise  normal  RECOMMENDATIONS: 1. Continue current colorectal surveillance recommendations with a repeat colonoscopy in 10 years. 2. Prescribe anusol HC suppositories; #14; one pr qhs prn; 3 refills  eSigned:  Eustace Quail, MD 09/24/2014 9:44 AM   cc: Anastasia Pall, MD and The Patient

## 2014-09-25 ENCOUNTER — Telehealth: Payer: Self-pay | Admitting: *Deleted

## 2014-09-25 NOTE — Telephone Encounter (Signed)
  Follow up Call-  Call back number 09/24/2014  Post procedure Call Back phone  # [phone]  Permission to leave phone message Yes     Patient questions:  Do you have a fever, pain , or abdominal swelling? No. Pain Score  0 *  Have you tolerated food without any problems? Yes.    Have you been able to return to your normal activities? Yes.    Do you have any questions about your discharge instructions: Diet   No. Medications  No. Follow up visit  No.  Do you have questions or concerns about your Care? No.  Actions: * If pain score is 4 or above: No action needed, pain <4.

## 2014-10-01 ENCOUNTER — Other Ambulatory Visit: Payer: Self-pay | Admitting: Internal Medicine

## 2014-12-13 ENCOUNTER — Other Ambulatory Visit: Payer: Self-pay | Admitting: Internal Medicine

## 2015-01-06 ENCOUNTER — Other Ambulatory Visit: Payer: Self-pay | Admitting: Nurse Practitioner

## 2015-01-06 DIAGNOSIS — Z780 Asymptomatic menopausal state: Secondary | ICD-10-CM

## 2015-01-08 ENCOUNTER — Other Ambulatory Visit: Payer: Self-pay | Admitting: Nurse Practitioner

## 2015-01-08 DIAGNOSIS — M8580 Other specified disorders of bone density and structure, unspecified site: Secondary | ICD-10-CM

## 2015-01-12 ENCOUNTER — Ambulatory Visit
Admission: RE | Admit: 2015-01-12 | Discharge: 2015-01-12 | Disposition: A | Discharge status: Home / Self Care | Payer: Medicare Other | Source: Ambulatory Visit | Attending: Nurse Practitioner | Admitting: Nurse Practitioner

## 2015-01-12 DIAGNOSIS — M8580 Other specified disorders of bone density and structure, unspecified site: Secondary | ICD-10-CM

## 2015-03-23 ENCOUNTER — Telehealth: Payer: Self-pay | Admitting: Internal Medicine

## 2015-03-23 NOTE — Telephone Encounter (Signed)
Spoke with patient and she feels fine.  I let her know just to watch her HR and to hold the PM dose tonight.  She is also due an appointment to see Dr Lovena Le as she has not been seen since 09/2013 and was due back in 12 months.  Melissa will call and schedule

## 2015-03-23 NOTE — Telephone Encounter (Signed)
New message      Pt thinks she took 2 rythmol tablets----(1 hr apart).  This happened about 3mins ago. Please advise

## 2015-04-02 ENCOUNTER — Telehealth: Payer: Self-pay | Admitting: Internal Medicine

## 2015-04-02 NOTE — Telephone Encounter (Signed)
She is going away [place] in June and wants to be seen prior as it;s been 2  Years and having increased episodes.  Does not feel it during the day mostly at night after laying down.  Still on all medications  Will move up to 5/26

## 2015-04-02 NOTE — Telephone Encounter (Signed)
Patient c/o Palpitations:  High priority if patient c/o lightheadedness and shortness of breath.  1. How long have you been having palpitations? The last few months it has escalated to everyday and mostly when she is laying down  2. Are you currently experiencing lightheadedness and shortness of breath? No  3. Have you checked your BP and heart rate? Elevated about a month ago when she saw PCP but doesn't know the exact number  4. Are you experiencing any other symptoms? A-Fib and palpitations

## 2015-04-07 ENCOUNTER — Ambulatory Visit (INDEPENDENT_AMBULATORY_CARE_PROVIDER_SITE_OTHER): Payer: Medicare Other | Admitting: Podiatry

## 2015-04-07 ENCOUNTER — Ambulatory Visit (INDEPENDENT_AMBULATORY_CARE_PROVIDER_SITE_OTHER): Payer: Medicare Other

## 2015-04-07 ENCOUNTER — Encounter: Payer: Self-pay | Admitting: Podiatry

## 2015-04-07 VITALS — BP 139/75 | HR 70 | Resp 15

## 2015-04-07 DIAGNOSIS — M779 Enthesopathy, unspecified: Secondary | ICD-10-CM | POA: Diagnosis not present

## 2015-04-07 DIAGNOSIS — M79672 Pain in left foot: Secondary | ICD-10-CM

## 2015-04-07 DIAGNOSIS — M79673 Pain in unspecified foot: Secondary | ICD-10-CM

## 2015-04-07 MED ORDER — TRIAMCINOLONE ACETONIDE 10 MG/ML IJ SUSP: 10.0000 mg | Freq: Once | INTRAMUSCULAR | Status: DC

## 2015-04-07 NOTE — Patient Instructions (Signed)

## 2015-04-07 NOTE — Progress Notes (Signed)
   Subjective:    Patient ID: Frances Smith, female    DOB: [birth year], 69 y.o.   MRN: [id number]  HPI Pt presents with bilateral tendonitis, worse in the left foot, has done pt in the past with success. Left foot is increasingly painful in arch and medial side of foot   Review of Systems  All other systems reviewed and are negative.      Objective:   Physical Exam        Assessment & Plan:

## 2015-04-08 NOTE — Progress Notes (Signed)
Subjective:     Patient ID: Frances Smith, female   DOB: [birth year], 69 y.o.   MRN: [id number]  HPI patient presents with chronic pain in her feet and orthotics that have lost their ability to support her arch. States her feet get tender with ambulation and that it's been an ongoing issue for the last several months   Review of Systems     Objective:   Physical Exam  Constitutional: She is oriented to person, place, and time.  Cardiovascular: Intact distal pulses.   Musculoskeletal: Normal range of motion.  Neurological: She is oriented to person, place, and time.  Skin: Skin is warm and dry.  Nursing note and vitals reviewed.  neurovascular status found to be intact with muscle strength adequate range of motion within normal limits. Patient's noted to have moderate depression of the arch and pain in the mid arch area left over right with inflammation and fluid within the medial band of the fascia. Patient's mild discomfort also in the ankle joint bilateral and is noted to have good digital perfusion and is well oriented 3     Assessment:     Amatory tendinitis with capsulitis deformity bilateral and pain in her feet of a moderate nature    Plan:     H&P and x-rays reviewed of feet. Today I went ahead and recommended long-term orthotics to reduce the stress against her feet with possibility for further treatment and discussed physical therapy and supportive shoe gear. Reappoint to recheck

## 2015-04-14 ENCOUNTER — Other Ambulatory Visit: Payer: Self-pay

## 2015-04-16 ENCOUNTER — Encounter: Payer: Self-pay | Admitting: Internal Medicine

## 2015-04-16 ENCOUNTER — Other Ambulatory Visit: Payer: Self-pay

## 2015-04-16 ENCOUNTER — Ambulatory Visit (INDEPENDENT_AMBULATORY_CARE_PROVIDER_SITE_OTHER): Payer: Medicare Other | Admitting: Internal Medicine

## 2015-04-16 VITALS — BP 130/74 | HR 67 | Ht 62.75 in | Wt 178.4 lb

## 2015-04-16 DIAGNOSIS — R0789 Other chest pain: Secondary | ICD-10-CM

## 2015-04-16 DIAGNOSIS — R002 Palpitations: Secondary | ICD-10-CM | POA: Diagnosis not present

## 2015-04-16 DIAGNOSIS — I480 Paroxysmal atrial fibrillation: Secondary | ICD-10-CM

## 2015-04-16 MED ORDER — APIXABAN 5 MG PO TABS: 5.0000 mg | ORAL_TABLET | Freq: Two times a day (BID) | ORAL | Status: DC

## 2015-04-16 NOTE — Assessment & Plan Note (Signed)
These are almost certainly related to her atrial fib. Will follow.

## 2015-04-16 NOTE — Assessment & Plan Note (Signed)
She has had no recurrent symptoms. She will continue her current meds.

## 2015-04-16 NOTE — Assessment & Plan Note (Signed)
Her symptoms have increased. We discussed the treatment options. Her strok risk has increased and I have recommended initiation of Eliquis. She will continue the Rhythmol at her current dose. We will stop ASA.

## 2015-04-16 NOTE — Patient Instructions (Addendum)
Medication Instructions:  Your physician has recommended you make the following change in your medication:  1) Start Eliquis 5 mg twice daily 2) Stop Aspirin 81 mg    Labwork: None ordered  Testing/Procedures: None ordered  Follow-Up: Your physician wants you to follow-up in: 6 months with Dr Knox Saliva will receive a reminder letter in the mail two months in advance. If you don't receive a letter, please call our office to schedule the follow-up appointment.   Any Other Special Instructions Will Be Listed Below (If Applicable).

## 2015-04-16 NOTE — Progress Notes (Signed)
HPI Frances Smith returns today for followup. She is a pleasant 69 yo woman with a h/o atrial flutter, s/p ablation, PAF which has been well controlled on Propafenone. She notes one episode of sustained palpitations several days ago and lasting over 8 hours. Other than that she has had no chest pain or sob. She has had problems with arthritis. She is scheduled to travel to [place] in a few weeks. She has been taking ASA but not on a systemic anti-coagulation. Allergies  Allergen Reactions  . Codeine Itching and Rash     Current Outpatient Prescriptions  Medication Sig Dispense Refill  . acetaminophen (TYLENOL) 325 MG tablet Take 650 mg by mouth 2 (two) times daily as needed (pain).     . Ascorbic Acid (VITAMIN C) 1000 MG tablet Take 1,000 mg by mouth daily.      . Calcium-Vitamin D 600-125 MG-UNIT TABS Take 2 tablets by mouth 2 (two) times daily.    . celecoxib (CELEBREX) 200 MG capsule Take 200 mg by mouth daily.    . Cholecalciferol (D3-1000) 1000 UNITS capsule Take 2,000 Units by mouth daily.     Marland Kitchen co-enzyme Q-10 50 MG capsule Take 100 mg by mouth daily.      Marland Kitchen doxylamine, Sleep, (UNISOM) 25 MG tablet Take 25 mg by mouth at bedtime as needed for sleep.    Marland Kitchen escitalopram (LEXAPRO) 20 MG tablet Take 20 mg by mouth daily.    . fish oil-omega-3 fatty acids 1000 MG capsule Take 2 g by mouth daily.      . Misc Natural Products (HEALTHY LIVER PO) Take 1 tablet by mouth daily.      . Multiple Vitamins-Minerals (MULTIVITAMIN WITH MINERALS) tablet Take 1 tablet by mouth daily.      . pravastatin (PRAVACHOL) 40 MG tablet Take 40 mg by mouth daily.      . propafenone (RYTHMOL SR) 325 MG 12 hr capsule Take 325 mg by mouth every 12 (twelve) hours.    . ranitidine (ZANTAC) 150 MG tablet Take 150 mg by mouth 2 (two) times daily.    Marland Kitchen rOPINIRole (REQUIP) 1 MG tablet Take 1 mg by mouth at bedtime.    Marland Kitchen thyroid (ARMOUR) 30 MG tablet Take 30 mg by mouth daily before breakfast.    . apixaban (ELIQUIS) 5 MG  TABS tablet Take 1 tablet (5 mg total) by mouth 2 (two) times daily. 180 tablet 3  . glucosamine-chondroitin 500-400 MG tablet Take 1 tablet by mouth 2 (two) times daily.    Marland Kitchen lidocaine (XYLOCAINE) 5 % ointment Apply 2 g topically 3 (three) times daily.  3  . Melatonin 5 MG CAPS Take 1 tablet by mouth at bedtime as needed. sleep     Current Facility-Administered Medications  Medication Dose Route Frequency Provider Last Rate Last Dose  . triamcinolone acetonide (KENALOG) 10 MG/ML injection 10 mg  10 mg Other Once Wallene Huh, DPM         Past Medical History  Diagnosis Date  . Paroxysmal atrial fibrillation   . H/O: hysterectomy   . Anxiety   . Depression   . Thyroid disease   . Hyperlipidemia   . GERD (gastroesophageal reflux disease)   . History of head injury   . Arthritis   . Sleep apnea     pt denies  . Osteopenia     ROS:   All systems reviewed and negative except as noted in the HPI.   Past Surgical History  Procedure  Laterality Date  . Tonsillectomy  1949  . Adenoidectomy  1949  . Endometrial ablation    . Colonoscopy  2010  . Vaginal hysterectomy  1984  . Incision and drainage abscess anal Left 1985    breast  . Treatment fistula anal  1988     Family History  Problem Relation Age of Onset  . Heart failure Father 72  . Thyroid disease Brother   . Anxiety disorder Daughter   . Depression Daughter   . Anxiety disorder Son   . Depression Son   . Anxiety disorder Brother   . Allergies Mother   . Asthma Mother   . Breast cancer Mother   . Colon cancer Maternal Aunt 34  . Colon cancer Cousin 20     History   Social History  . Marital Status: Married    Spouse Name: N/A  . Number of Children: 2  . Years of Education: N/A   Occupational History  . retired     Therapist, sports, Geophysicist/field seismologist   Social History Main Topics  . Smoking status: Former Smoker -- 0.50 packs/day for 4 years    Types: Cigarettes    Quit date: 11/21/1964  . Smokeless  tobacco: Never Used  . Alcohol Use: No  . Drug Use: No  . Sexual Activity: Not on file   Other Topics Concern  . Not on file   Social History Narrative     BP 130/74 mmHg  Pulse 67  Ht 5' 2.75" (1.594 m)  Wt 178 lb 6.4 oz (80.922 kg)  BMI 31.85 kg/m2  Physical Exam:  Well appearing middle age woman, NAD HEENT: Unremarkable Neck:  7 cm JVD, no thyromegally Lungs:  Clear with no wheezes HEART:  Regular rate rhythm, no murmurs, no rubs, no clicks Abd:  soft, positive bowel sounds, no organomegally, no rebound, no guarding Ext:  2 plus pulses, no edema, no cyanosis, no clubbing Skin:  No rashes no nodules Neuro:  CN II through XII intact, motor grossly intact  EKG NSR with IRBBB  Assess/Plan:

## 2015-04-16 NOTE — Assessment & Plan Note (Signed)
She will continue her statin therapy. Will follow.

## 2015-04-21 ENCOUNTER — Ambulatory Visit: Payer: Medicare Other | Admitting: Internal Medicine

## 2015-04-28 ENCOUNTER — Ambulatory Visit: Payer: Medicare Other | Admitting: *Deleted

## 2015-04-28 DIAGNOSIS — M779 Enthesopathy, unspecified: Secondary | ICD-10-CM

## 2015-04-28 NOTE — Patient Instructions (Signed)

## 2015-04-28 NOTE — Progress Notes (Signed)
Patient ID: Frances Smith, female   DOB: 1946-09-23, 69 y.o.   MRN: [id number] PICKING UP INSERTS

## 2015-05-07 ENCOUNTER — Other Ambulatory Visit: Payer: Self-pay | Admitting: *Deleted

## 2015-05-07 DIAGNOSIS — I480 Paroxysmal atrial fibrillation: Secondary | ICD-10-CM

## 2015-05-07 MED ORDER — APIXABAN 5 MG PO TABS: 5.0000 mg | ORAL_TABLET | Freq: Two times a day (BID) | ORAL | Status: DC

## 2015-08-20 DIAGNOSIS — M1710 Unilateral primary osteoarthritis, unspecified knee: Secondary | ICD-10-CM | POA: Diagnosis present

## 2015-10-01 ENCOUNTER — Other Ambulatory Visit: Payer: Self-pay

## 2015-10-01 DIAGNOSIS — Z1231 Encounter for screening mammogram for malignant neoplasm of breast: Secondary | ICD-10-CM

## 2015-10-14 DIAGNOSIS — R739 Hyperglycemia, unspecified: Secondary | ICD-10-CM | POA: Insufficient documentation

## 2015-10-22 ENCOUNTER — Other Ambulatory Visit: Payer: Self-pay | Admitting: *Deleted

## 2015-10-22 DIAGNOSIS — I480 Paroxysmal atrial fibrillation: Secondary | ICD-10-CM

## 2015-10-22 MED ORDER — PROPAFENONE HCL ER 325 MG PO CP12: 325.0000 mg | ORAL_CAPSULE | Freq: Two times a day (BID) | ORAL | Status: DC

## 2015-11-03 ENCOUNTER — Ambulatory Visit
Admission: RE | Admit: 2015-11-03 | Discharge: 2015-11-03 | Disposition: A | Discharge status: Home / Self Care | Payer: Medicare Other | Source: Ambulatory Visit

## 2015-11-03 DIAGNOSIS — Z1231 Encounter for screening mammogram for malignant neoplasm of breast: Secondary | ICD-10-CM

## 2015-11-13 ENCOUNTER — Ambulatory Visit (INDEPENDENT_AMBULATORY_CARE_PROVIDER_SITE_OTHER): Payer: Medicare Other | Admitting: Internal Medicine

## 2015-11-13 ENCOUNTER — Encounter: Payer: Self-pay | Admitting: Internal Medicine

## 2015-11-13 VITALS — BP 116/66 | HR 60 | Ht 62.0 in | Wt 169.0 lb

## 2015-11-13 DIAGNOSIS — I480 Paroxysmal atrial fibrillation: Secondary | ICD-10-CM

## 2015-11-13 DIAGNOSIS — R002 Palpitations: Secondary | ICD-10-CM | POA: Diagnosis not present

## 2015-11-13 NOTE — Progress Notes (Signed)
HPI Frances Smith returns today for followup. She is a pleasant 69 yo woman with a h/o atrial flutter, s/p ablation, PAF which has been well controlled on Propafenone. She has done well in the interim. Other than that she has had no chest pain or sob. She has had problems with arthritis. She was started on systemic anti-coagulation when I saw her last time.  Allergies  Allergen Reactions  . Codeine Itching and Rash     Current Outpatient Prescriptions  Medication Sig Dispense Refill  . acetaminophen (TYLENOL) 325 MG tablet Take 650 mg by mouth 2 (two) times daily as needed (pain).     Marland Kitchen apixaban (ELIQUIS) 5 MG TABS tablet Take 1 tablet (5 mg total) by mouth 2 (two) times daily. 180 tablet 3  . Ascorbic Acid (VITAMIN C) 1000 MG tablet Take 1,000 mg by mouth daily.      . Calcium-Vitamin D 600-125 MG-UNIT TABS Take 2 tablets by mouth 2 (two) times daily.    . celecoxib (CELEBREX) 200 MG capsule Take 200 mg by mouth daily.    . Cholecalciferol (D3-1000) 1000 UNITS capsule Take 2,000 Units by mouth daily.     Marland Kitchen co-enzyme Q-10 50 MG capsule Take 100 mg by mouth daily.      Marland Kitchen doxylamine, Sleep, (UNISOM) 25 MG tablet Take 25 mg by mouth at bedtime as needed for sleep.    Marland Kitchen escitalopram (LEXAPRO) 20 MG tablet Take 20 mg by mouth daily.    . Melatonin 5 MG CAPS Take 1 tablet by mouth at bedtime as needed. sleep    . Misc Natural Products (HEALTHY LIVER PO) Take 1 tablet by mouth daily.      . Multiple Vitamins-Minerals (MULTIVITAMIN WITH MINERALS) tablet Take 1 tablet by mouth daily.      . pravastatin (PRAVACHOL) 40 MG tablet Take 40 mg by mouth daily.      . propafenone (RYTHMOL SR) 325 MG 12 hr capsule Take 1 capsule (325 mg total) by mouth every 12 (twelve) hours. 180 capsule 1  . ranitidine (ZANTAC) 150 MG tablet Take 150 mg by mouth 2 (two) times daily.    Marland Kitchen rOPINIRole (REQUIP) 1 MG tablet Take 1 mg by mouth at bedtime.    Marland Kitchen thyroid (ARMOUR) 30 MG tablet Take 30 mg by mouth daily before  breakfast.     Current Facility-Administered Medications  Medication Dose Route Frequency Provider Last Rate Last Dose  . triamcinolone acetonide (KENALOG) 10 MG/ML injection 10 mg  10 mg Other Once Wallene Huh, DPM         Past Medical History  Diagnosis Date  . Paroxysmal atrial fibrillation (HCC)   . H/O: hysterectomy   . Anxiety   . Depression   . Thyroid disease   . Hyperlipidemia   . GERD (gastroesophageal reflux disease)   . History of head injury   . Arthritis   . Sleep apnea     pt denies  . Osteopenia     ROS:   All systems reviewed and negative except as noted in the HPI.   Past Surgical History  Procedure Laterality Date  . Tonsillectomy  1949  . Adenoidectomy  1949  . Endometrial ablation    . Colonoscopy  2010  . Vaginal hysterectomy  1984  . Incision and drainage abscess anal Left 1985    breast  . Treatment fistula anal  1988     Family History  Problem Relation Age of Onset  . Heart failure  Father 64  . Thyroid disease Brother   . Anxiety disorder Daughter   . Depression Daughter   . Anxiety disorder Son   . Depression Son   . Anxiety disorder Brother   . Allergies Mother   . Asthma Mother   . Breast cancer Mother   . Colon cancer Maternal Aunt 75  . Colon cancer Cousin 9     Social History   Social History  . Marital Status: Married    Spouse Name: N/A  . Number of Children: 2  . Years of Education: N/A   Occupational History  . retired     Therapist, sports, Geophysicist/field seismologist   Social History Main Topics  . Smoking status: Former Smoker -- 0.50 packs/day for 4 years    Types: Cigarettes    Quit date: 11/21/1964  . Smokeless tobacco: Never Used  . Alcohol Use: No  . Drug Use: No  . Sexual Activity: Not on file   Other Topics Concern  . Not on file   Social History Narrative     BP 116/66 mmHg  Pulse 60  Ht 5\' 2"  (1.575 m)  Wt 169 lb (76.658 kg)  BMI 30.90 kg/m2  Physical Exam:  Well appearing middle age woman,  NAD HEENT: Unremarkable Neck:  7 cm JVD, no thyromegally Lungs:  Clear with no wheezes HEART:  Regular rate rhythm, no murmurs, no rubs, no clicks Abd:  soft, positive bowel sounds, no organomegally, no rebound, no guarding Ext:  2 plus pulses, no edema, no cyanosis, no clubbing Skin:  No rashes no nodules Neuro:  CN II through XII intact, motor grossly intact  EKG Sinus brady with low voltage  Assess/Plan:

## 2015-11-13 NOTE — Patient Instructions (Signed)

## 2015-11-13 NOTE — Assessment & Plan Note (Signed)
These have been minimal. Will follow.

## 2015-11-13 NOTE — Assessment & Plan Note (Signed)
She is maintaining NSR very nicely on propafenone. No change in meds.

## 2016-02-23 DIAGNOSIS — M170 Bilateral primary osteoarthritis of knee: Secondary | ICD-10-CM | POA: Insufficient documentation

## 2016-03-30 ENCOUNTER — Other Ambulatory Visit: Payer: Self-pay | Admitting: Internal Medicine

## 2016-04-12 ENCOUNTER — Other Ambulatory Visit: Payer: Self-pay | Admitting: Internal Medicine

## 2016-05-16 ENCOUNTER — Other Ambulatory Visit: Payer: Self-pay | Admitting: Orthopedic Surgery

## 2016-05-16 DIAGNOSIS — M1712 Unilateral primary osteoarthritis, left knee: Secondary | ICD-10-CM

## 2016-05-23 ENCOUNTER — Ambulatory Visit
Admission: RE | Admit: 2016-05-23 | Discharge: 2016-05-23 | Disposition: A | Discharge status: Home / Self Care | Payer: Medicare Other | Source: Ambulatory Visit | Attending: Orthopedic Surgery | Admitting: Orthopedic Surgery

## 2016-05-23 DIAGNOSIS — M1712 Unilateral primary osteoarthritis, left knee: Secondary | ICD-10-CM

## 2016-07-13 ENCOUNTER — Telehealth: Payer: Self-pay | Admitting: Internal Medicine

## 2016-07-13 NOTE — Telephone Encounter (Signed)
New message     Pt calling stating that Dr. Clearence Cheek wants to put the pt on Cymbalta and wants to know if it is compatible with Rhyehmol. Please call.

## 2016-07-14 NOTE — Telephone Encounter (Signed)
Follow up       Pt calling to check status on whether she can start taking cymbalta.

## 2016-07-14 NOTE — Telephone Encounter (Signed)
I have left a message for the patient with recommendations and to call me back when she can

## 2016-07-14 NOTE — Telephone Encounter (Signed)
Would make sure that Cymbalta is replacing patient's Lexapro to avoid duplication of therapy. Cymbalta will partially inhibit the excretion of Rythmol - they are not contraindicated to use together, but would recommend bringing pt in for an EKG within a week of starting Cymbalta.

## 2016-07-25 NOTE — Progress Notes (Signed)
Cardiology Office Note Date:  07/26/2016  Patient ID:  Frances Smith, Frances Smith 1946-02-12, MRN [id number] PCP:  Chesley Noon, MD  Cardiologist:  Dr. Lovena Le    Chief Complaint: pre-op clearance  History of Present Illness: Frances Smith is a 70 y.o. female with history of PAFlutter/ablated, PAfib, HLD, hypothyroidism, comes to the office today to be seen for Dr. Lovena Le.  She was last seen by him Dec 2016, at that time doing well, on Porpafenone for AAD tx and Eliquis for a/c.  She reports she is feeling well outside of her knee pain, she is pending a partial knee replacement on the left scheduled for 08/10/16.  She will require clearance.  She denies any kind of CP or SOB, no near syncope or syncope.  She has infrequent skipped beats only, has not felt like she has had any episode of AFib since December when she felt like she had 3 days she felt like she was having AF, none since then.  She denies bleeding or signs of bleeding,  She mentions on 2 occassions in the last year or so a LLE rash mid-shin and distal, sporadic, not puritic or painful, lasted 3 days, self resolved, saw a dermatologist who thought possible superficial vascular, made no recommendations to stop Eliquis or any of her medicines.  She does not have any significant or notable bruising at all on her arms/legs, no bleeding or signs of bleeding that she reports.  H/H and plts look good on labs dates 07/13/16.  She reports her lipids/labs are monitored by her PMD.  Past Medical History:  Diagnosis Date  . Anxiety   . Arthritis   . Depression   . GERD (gastroesophageal reflux disease)   . H/O: hysterectomy   . History of head injury   . Hyperlipidemia   . Osteopenia   . Paroxysmal atrial fibrillation (HCC)   . Sleep apnea    pt denies  . Thyroid disease     Past Surgical History:  Procedure Laterality Date  . ADENOIDECTOMY  1949  . COLONOSCOPY  2010  . ENDOMETRIAL ABLATION    . INCISION AND DRAINAGE ABSCESS ANAL  Left 1985   breast  . TONSILLECTOMY  1949  . TREATMENT FISTULA ANAL  1988  . VAGINAL HYSTERECTOMY  1984    Current Outpatient Prescriptions  Medication Sig Dispense Refill  . acetaminophen (TYLENOL) 325 MG tablet Take 500 mg by mouth 2 (two) times daily as needed (pain).     Marland Kitchen apixaban (ELIQUIS) 5 MG TABS tablet Take 1 tablet (5 mg total) by mouth 2 (two) times daily. 180 tablet 1  . Ascorbic Acid (VITAMIN C) 1000 MG tablet Take 1,000 mg by mouth daily.      . celecoxib (CELEBREX) 200 MG capsule Take 200 mg by mouth daily.    . Cholecalciferol (D3-1000) 1000 UNITS capsule Take 2,000 Units by mouth daily.     Marland Kitchen co-enzyme Q-10 50 MG capsule Take 100 mg by mouth daily.      Marland Kitchen doxylamine, Sleep, (UNISOM) 25 MG tablet Take 25 mg by mouth at bedtime as needed for sleep.    . DULoxetine (CYMBALTA) 30 MG capsule Take 30 mg by mouth 2 (two) times daily.    . Melatonin 5 MG CAPS Take 1 tablet by mouth at bedtime as needed. sleep    . Misc Natural Products (HEALTHY LIVER PO) Take 1 tablet by mouth daily.      . Multiple Vitamins-Minerals (MULTIVITAMIN WITH MINERALS) tablet  Take 1 tablet by mouth daily.      . pravastatin (PRAVACHOL) 40 MG tablet Take 40 mg by mouth daily.      . propafenone (RYTHMOL SR) 325 MG 12 hr capsule TAKE 1 CAPSULE EVERY 12 HOURS 180 capsule 2  . ranitidine (ZANTAC) 150 MG tablet Take 150 mg by mouth 2 (two) times daily.    Marland Kitchen rOPINIRole (REQUIP) 1 MG tablet Take 1 mg by mouth at bedtime.    Marland Kitchen thyroid (ARMOUR) 30 MG tablet Take 30 mg by mouth daily before breakfast.     Current Facility-Administered Medications  Medication Dose Route Frequency Provider Last Rate Last Dose  . triamcinolone acetonide (KENALOG) 10 MG/ML injection 10 mg  10 mg Other Once Wallene Huh, DPM        Allergies:   Codeine   Social History:  The patient  reports that she quit smoking about 51 years ago. Her smoking use included Cigarettes. She has a 2.00 pack-year smoking history. She has never  used smokeless tobacco. She reports that she does not drink alcohol or use drugs.   Family History:  The patient's family history includes Allergies in her mother; Anxiety disorder in her brother, daughter, and son; Asthma in her mother; Breast cancer in her mother; Colon cancer (age of onset: 21) in her cousin; Colon cancer (age of onset: 39) in her maternal aunt; Depression in her daughter and son; Heart failure (age of onset: 81) in her father; Thyroid disease in her brother.  ROS:  Please see the history of present illness.   All other systems are reviewed and otherwise negative.   PHYSICAL EXAM:  VS:  BP 118/82   Pulse 73   Ht 5\' 2"  (1.575 m)   Wt 186 lb (84.4 kg)   BMI 34.02 kg/m  BMI: Body mass index is 34.02 kg/m. Well nourished, well developed, in no acute distress  HEENT: normocephalic, atraumatic  Neck: no JVD, carotid bruits or masses Cardiac:  RRR; no significant murmurs, no rubs, or gallops Lungs:  clear to auscultation bilaterally, no wheezing, rhonchi or rales  Abd: soft, nontender MS: no deformity or atrophy Ext: no edema  Skin: warm and dry, no rash Neuro:  No gross deficits appreciated Psych: euthymic mood, full affect    EKG:  Done today reviewed by myself and Dr. Radford Pax shows SR, PR 127ms, QRS 59ms, QTc 450ms  09/21/11: stress myoview Overall Impression:  Normal stress nuclear study EF: 77% NL LV Function; NL Wall Motion   Recent Labs: No results found for requested labs within last 8760 hours.  No results found for requested labs within last 8760 hours.   CrCl cannot be calculated (No order found.).   Wt Readings from Last 3 Encounters:  07/26/16 186 lb (84.4 kg)  11/13/15 169 lb (76.7 kg)  04/16/15 178 lb 6.4 oz (80.9 kg)     Other studies reviewed: Additional studies/records reviewed today include: summarized above  ASSESSMENT AND PLAN:   1. PAfib     On Rhythmal     CHA2DS2Vasc is at least 2, on Eliquis     Discussed cymbalta with  Propafenone with RPH, OK to continue as long as EKG/intervals remain stable, monitor EKG as usual  2. Pre-op     L knee surgery, partial replac,emt 08/10/16     The patient inquires about the duration off Eliquis doing a block vs general anestesia, she would require 5 days off for spinal/nerve block  Discussed with Dr. Radford Pax,  DOD, given minimal AF, no objection to the longer duration off Eliquis if felt she was a better candidate for nerve block rather then general anesthesia, she recommends updating her stress test to help identify her cardiac surgical risk.     Disposition: Lexiscan stress myoview (given her knee pain) for pre-op risk assessment, otherwise, Dr. Lovena Le in 64mo, sooner if needed.   Current medicines are reviewed at length with the patient today.  The patient did not have any concerns regarding medicines.  Haywood Lasso, PA-C 07/26/2016 11:36 AM     [address] [place] [address] [phone] (office)  [phone] (fax)

## 2016-07-26 ENCOUNTER — Ambulatory Visit (INDEPENDENT_AMBULATORY_CARE_PROVIDER_SITE_OTHER): Payer: Medicare Other | Admitting: Physician Assistant

## 2016-07-26 VITALS — BP 118/82 | HR 73 | Ht 62.0 in | Wt 186.0 lb

## 2016-07-26 DIAGNOSIS — I480 Paroxysmal atrial fibrillation: Secondary | ICD-10-CM | POA: Diagnosis not present

## 2016-07-26 DIAGNOSIS — Z01818 Encounter for other preprocedural examination: Secondary | ICD-10-CM | POA: Diagnosis not present

## 2016-07-26 NOTE — Patient Instructions (Addendum)
Medication Instructions:   Your physician recommends that you continue on your current medications as directed. Please refer to the Current Medication list given to you today.\  If you need a refill on your cardiac medications before your next appointment, please call your pharmacy.  Labwork: NONE ORDER TODAY    Testing/Procedures: ASAP FOR SURGERY CLEARANCE.Your physician has requested that you have a lexiscan myoview. For further information please visit [email or web address]. Please follow instruction sheet, as given.   Follow-Up: Your physician wants you to follow-up in:  [place]  [place] will receive a reminder letter in the mail two months in advance. If you don't receive a letter, please call our office to schedule the follow-up appointment.      Any Other Special Instructions Will Be Listed Below (If Applicable).

## 2016-07-27 ENCOUNTER — Ambulatory Visit: Payer: Self-pay | Admitting: Orthopedic Surgery

## 2016-07-27 ENCOUNTER — Telehealth (HOSPITAL_COMMUNITY): Payer: Self-pay | Admitting: *Deleted

## 2016-07-27 NOTE — Telephone Encounter (Signed)
Patient given detailed instructions per Myocardial Perfusion Study Information Sheet for the test on 07/28/16 at 7:15. Patient notified to arrive 15 minutes early and that it is imperative to arrive on time for appointment to keep from having the test rescheduled. ° If you need to cancel or reschedule your appointment, please call the office within 24 hours of your appointment. Failure to do so may result in a cancellation of your appointment, and a $50 no show fee. Patient verbalized understanding..Sharon S Brooks ° ° ° °

## 2016-07-28 ENCOUNTER — Ambulatory Visit (HOSPITAL_COMMUNITY): Payer: Medicare Other | Attending: Cardiology

## 2016-07-28 DIAGNOSIS — Z01818 Encounter for other preprocedural examination: Secondary | ICD-10-CM | POA: Insufficient documentation

## 2016-07-28 DIAGNOSIS — I2510 Atherosclerotic heart disease of native coronary artery without angina pectoris: Secondary | ICD-10-CM

## 2016-07-28 DIAGNOSIS — I4891 Unspecified atrial fibrillation: Secondary | ICD-10-CM | POA: Diagnosis not present

## 2016-07-28 LAB — MYOCARDIAL PERFUSION IMAGING
CHL CUP NUCLEAR SDS: 0
CHL CUP RESTING HR STRESS: 68 {beats}/min
CSEPPHR: 100 {beats}/min
LV dias vol: 73 mL (ref 46–106)
LVSYSVOL: 21 mL
NUC STRESS TID: 1.03
RATE: 0.25
SRS: 2
SSS: 2

## 2016-07-28 MED ORDER — TECHNETIUM TC 99M TETROFOSMIN IV KIT
32.7000 | PACK | Freq: Once | INTRAVENOUS | Status: AC | PRN
  Administered 2016-07-28: 32.7 via INTRAVENOUS
  Filled 2016-07-28: qty 33

## 2016-07-28 MED ORDER — TECHNETIUM TC 99M TETROFOSMIN IV KIT
10.7000 | PACK | Freq: Once | INTRAVENOUS | Status: AC | PRN
  Administered 2016-07-28: 11 via INTRAVENOUS
  Filled 2016-07-28: qty 11

## 2016-07-28 MED ORDER — REGADENOSON 0.4 MG/5ML IV SOLN
0.4000 mg | Freq: Once | INTRAVENOUS | Status: AC
  Administered 2016-07-28: 0.4 mg via INTRAVENOUS

## 2016-07-29 ENCOUNTER — Telehealth: Payer: Self-pay | Admitting: *Deleted

## 2016-07-29 NOTE — Telephone Encounter (Signed)
SPOKE TO PT ABOUT RESULTS AND VERBALIZED UNDERSTANDING AND GAVE FAX NUMBER FOR SURGEON OFFICE TO FAX OVER CLEARANCE FORM TO BE FILLED OUT

## 2016-07-29 NOTE — Telephone Encounter (Signed)
-----   Message from [facility], [place] sent at 07/29/2016  5:34 AM EDT ----- Please let the patient know her stress test was normal.  Did her surgeon's office send clearance form for her knee surgery?  Will be able to fill that out for her.  Thanks [employer]

## 2016-08-01 ENCOUNTER — Encounter (HOSPITAL_COMMUNITY): Payer: Self-pay

## 2016-08-03 ENCOUNTER — Encounter (HOSPITAL_COMMUNITY)
Admission: RE | Admit: 2016-08-03 | Discharge: 2016-08-03 | Disposition: A | Discharge status: Home / Self Care | Payer: Medicare Other | Source: Ambulatory Visit | Attending: Orthopedic Surgery | Admitting: Orthopedic Surgery

## 2016-08-03 ENCOUNTER — Encounter (HOSPITAL_COMMUNITY): Payer: Self-pay | Admitting: *Deleted

## 2016-08-03 DIAGNOSIS — Z01812 Encounter for preprocedural laboratory examination: Secondary | ICD-10-CM | POA: Insufficient documentation

## 2016-08-03 HISTORY — DX: Cardiac arrhythmia, unspecified: I49.9

## 2016-08-03 HISTORY — DX: Personal history of other diseases of the respiratory system: Z87.09

## 2016-08-03 HISTORY — DX: Personal history of other infectious and parasitic diseases: Z86.19

## 2016-08-03 HISTORY — DX: Asymptomatic menopausal state: Z78.0

## 2016-08-03 HISTORY — DX: Cardiac murmur, unspecified: R01.1

## 2016-08-03 HISTORY — DX: Irritable bowel syndrome, unspecified: K58.9

## 2016-08-03 HISTORY — DX: Restless legs syndrome: G25.81

## 2016-08-03 LAB — URINALYSIS, ROUTINE W REFLEX MICROSCOPIC
Bilirubin Urine: NEGATIVE
GLUCOSE, UA: NEGATIVE mg/dL
HGB URINE DIPSTICK: NEGATIVE
Ketones, ur: NEGATIVE mg/dL
LEUKOCYTES UA: NEGATIVE
Nitrite: NEGATIVE
PROTEIN: NEGATIVE mg/dL
SPECIFIC GRAVITY, URINE: 1.041 — AB (ref 1.005–1.030)
pH: 5 (ref 5.0–8.0)

## 2016-08-03 LAB — SURGICAL PCR SCREEN
MRSA, PCR: NEGATIVE
Staphylococcus aureus: NEGATIVE

## 2016-08-03 NOTE — Patient Instructions (Signed)
Frances Smith  08/03/2016   Your procedure is scheduled on: Wednesday August 10, 2016  Report to [facility] Main  Entrance take [place]  elevators to 3rd floor to  [place] at 9:30 AM.  Call this number if you have problems the morning of surgery [phone]   Remember: ONLY 1 PERSON MAY GO WITH YOU TO SHORT STAY TO GET  READY MORNING OF [place].  Do not eat food or drink liquids :After Midnight.     Take these medicines the morning of surgery with A SIP OF WATER: Nexium if needed; Propafenone (Rythmol); Ranitidine (Zantac) if needed; Thyroid (Armour)                                You may not have any metal on your body including hair pins and              piercings  Do not wear jewelry, make-up, lotions, powders or perfumes, deodorant             Do not wear nail polish.  Do not shave  48 hours prior to surgery.             Do not bring valuables to the hospital. [place].  Contacts, dentures or bridgework may not be worn into surgery.  Leave suitcase in the car. After surgery it may be brought to your room.                 Please read over the following fact sheets you were given:MRSA INFORMATION SHEET; INCENTIVE SPIROMETER; BLOOD TRANSFUSION INFORMATION SHEET  _____________________________________________________________________             [facility] - Preparing for Surgery Before surgery, you can play an important role.  Because skin is not sterile, your skin needs to be as free of germs as possible.  You can reduce the number of germs on your skin by washing with CHG (chlorahexidine gluconate) soap before surgery.  CHG is an antiseptic cleaner which kills germs and bonds with the skin to continue killing germs even after washing. Please DO NOT use if you have an allergy to CHG or antibacterial soaps.  If your skin becomes reddened/irritated stop using the CHG and inform your nurse  when you arrive at Short Stay. Do not shave (including legs and underarms) for at least 48 hours prior to the first CHG shower.  You may shave your face/neck. Please follow these instructions carefully:  1.  Shower with CHG Soap the night before surgery and the  morning of Surgery.  2.  If you choose to wash your hair, wash your hair first as usual with your  normal  shampoo.  3.  After you shampoo, rinse your hair and body thoroughly to remove the  shampoo.                           4.  Use CHG as you would any other liquid soap.  You can apply chg directly  to the skin and wash                       Gently with  a scrungie or clean washcloth.  5.  Apply the CHG Soap to your body ONLY FROM THE NECK DOWN.   Do not use on face/ open                           Wound or open sores. Avoid contact with eyes, ears mouth and genitals (private parts).                       Wash face,  Genitals (private parts) with your normal soap.             6.  Wash thoroughly, paying special attention to the area where your surgery  will be performed.  7.  Thoroughly rinse your body with warm water from the neck down.  8.  DO NOT shower/wash with your normal soap after using and rinsing off  the CHG Soap.                9.  Pat yourself dry with a clean towel.            10.  Wear clean pajamas.            11.  Place clean sheets on your bed the night of your first shower and do not  sleep with pets. Day of Surgery : Do not apply any lotions/deodorants the morning of surgery.  Please wear clean clothes to the hospital/surgery center.  FAILURE TO FOLLOW THESE INSTRUCTIONS MAY RESULT IN THE CANCELLATION OF YOUR SURGERY PATIENT SIGNATURE_________________________________  NURSE SIGNATURE__________________________________  ________________________________________________________________________   Adam Phenix  An incentive spirometer is a tool that can help keep your lungs clear and active. This tool  measures how well you are filling your lungs with each breath. Taking long deep breaths may help reverse or decrease the chance of developing breathing (pulmonary) problems (especially infection) following:  A long period of time when you are unable to move or be active. BEFORE THE PROCEDURE   If the spirometer includes an indicator to show your best effort, your nurse or respiratory therapist will set it to a desired goal.  If possible, sit up straight or lean slightly forward. Try not to slouch.  Hold the incentive spirometer in an upright position. INSTRUCTIONS FOR USE  1. Sit on the edge of your bed if possible, or sit up as far as you can in bed or on a chair. 2. Hold the incentive spirometer in an upright position. 3. Breathe out normally. 4. Place the mouthpiece in your mouth and seal your lips tightly around it. 5. Breathe in slowly and as deeply as possible, raising the piston or the ball toward the top of the column. 6. Hold your breath for 3-5 seconds or for as long as possible. Allow the piston or ball to fall to the bottom of the column. 7. Remove the mouthpiece from your mouth and breathe out normally. 8. Rest for a few seconds and repeat Steps 1 through 7 at least 10 times every 1-2 hours when you are awake. Take your time and take a few normal breaths between deep breaths. 9. The spirometer may include an indicator to show your best effort. Use the indicator as a goal to work toward during each repetition. 10. After each set of 10 deep breaths, practice coughing to be sure your lungs are clear. If you have an incision (the cut made at the time of surgery), support your  incision when coughing by placing a pillow or rolled up towels firmly against it. Once you are able to get out of bed, walk around indoors and cough well. You may stop using the incentive spirometer when instructed by your caregiver.  RISKS AND COMPLICATIONS  Take your time so you do not get dizzy or  light-headed.  If you are in pain, you may need to take or ask for pain medication before doing incentive spirometry. It is harder to take a deep breath if you are having pain. AFTER USE  Rest and breathe slowly and easily.  It can be helpful to keep track of a log of your progress. Your caregiver can provide you with a simple table to help with this. If you are using the spirometer at home, follow these instructions: [place] IF:   You are having difficultly using the spirometer.  You have trouble using the spirometer as often as instructed.  Your pain medication is not giving enough relief while using the spirometer.  You develop fever of 100.5 F (38.1 C) or higher. SEEK IMMEDIATE MEDICAL CARE IF:   You cough up bloody sputum that had not been present before.  You develop fever of 102 F (38.9 C) or greater.  You develop worsening pain at or near the incision site. MAKE SURE YOU:   Understand these instructions.  Will watch your condition.  Will get help right away if you are not doing well or get worse. Document Released: 03/20/2007 Document Revised: 01/30/2012 Document Reviewed: 05/21/2007 ExitCare Patient Information 2014 ExitCare, [place].   ________________________________________________________________________  WHAT IS A BLOOD TRANSFUSION? Blood Transfusion Information  A transfusion is the replacement of blood or some of its parts. Blood is made up of multiple cells which provide different functions.  Red blood cells carry oxygen and are used for blood loss replacement.  White blood cells fight against infection.  Platelets control bleeding.  Plasma helps clot blood.  Other blood products are available for specialized needs, such as hemophilia or other clotting disorders. BEFORE THE TRANSFUSION  Who gives blood for transfusions?   Healthy volunteers who are fully evaluated to make sure their blood is safe. This is blood bank  blood. Transfusion therapy is the safest it has ever been in the practice of medicine. Before blood is taken from a donor, a complete history is taken to make sure that person has no history of diseases nor engages in risky social behavior (examples are intravenous drug use or sexual activity with multiple partners). The donor's travel history is screened to minimize risk of transmitting infections, such as malaria. The donated blood is tested for signs of infectious diseases, such as HIV and hepatitis. The blood is then tested to be sure it is compatible with you in order to minimize the chance of a transfusion reaction. If you or a relative donates blood, this is often done in anticipation of surgery and is not appropriate for emergency situations. It takes many days to process the donated blood. RISKS AND COMPLICATIONS Although transfusion therapy is very safe and saves many lives, the main dangers of transfusion include:   Getting an infectious disease.  Developing a transfusion reaction. This is an allergic reaction to something in the blood you were given. Every precaution is taken to prevent this. The decision to have a blood transfusion has been considered carefully by your caregiver before blood is given. Blood is not given unless the benefits outweigh the risks. AFTER THE TRANSFUSION  Right  after receiving a blood transfusion, you will usually feel much better and more energetic. This is especially true if your red blood cells have gotten low (anemic). The transfusion raises the level of the red blood cells which carry oxygen, and this usually causes an energy increase.  The nurse administering the transfusion will monitor you carefully for complications. HOME CARE INSTRUCTIONS  No special instructions are needed after a transfusion. You may find your energy is better. Speak with your caregiver about any limitations on activity for underlying diseases you may have. SEEK MEDICAL CARE IF:    Your condition is not improving after your transfusion.  You develop redness or irritation at the intravenous (IV) site. SEEK IMMEDIATE MEDICAL CARE IF:  Any of the following symptoms occur over the next 12 hours:  Shaking chills.  You have a temperature by mouth above 102 F (38.9 C), not controlled by medicine.  Chest, back, or muscle pain.  People around you feel you are not acting correctly or are confused.  Shortness of breath or difficulty breathing.  Dizziness and fainting.  You get a rash or develop hives.  You have a decrease in urine output.  Your urine turns a dark color or changes to pink, red, or brown. Any of the following symptoms occur over the next 10 days:  You have a temperature by mouth above 102 F (38.9 C), not controlled by medicine.  Shortness of breath.  Weakness after normal activity.  The white part of the eye turns yellow (jaundice).  You have a decrease in the amount of urine or are urinating less often.  Your urine turns a dark color or changes to pink, red, or brown. Document Released: 11/04/2000 Document Revised: 01/30/2012 Document Reviewed: 06/23/2008 [facility] Patient Information 2014 [place], [place].  _______________________________________________________________________

## 2016-08-03 NOTE — Progress Notes (Signed)
Labs per chart CMP, CBCD, A1C 07/13/2016

## 2016-08-04 LAB — ABO/RH: ABO/RH(D): O POS

## 2016-08-08 ENCOUNTER — Ambulatory Visit: Payer: Medicare Other | Admitting: Physician Assistant

## 2016-08-09 ENCOUNTER — Ambulatory Visit: Payer: Self-pay | Admitting: Orthopedic Surgery

## 2016-08-09 NOTE — H&P (Signed)
Frances Smith DOB: 11-21-1946 Married / Language: English / Race: White Female Date of Admission:  08/10/2016 CC:  Left Knee Pain History of Present Illness  The patient is a 70 year old female who comes in  for a preoperative History and Physical. The patient is scheduled for a left unicompartmental knee replacement to be performed by Dr. Dione Plover. Aluisio, MD at [facility] on 08/10/2016. The patient is a 70 year old female who present for follow up of their knee. The patient is being followed for their bilateral knee pain and osteoarthritis. They are now months out from [place] series. Symptoms reported include: pain (left worse than right), aching, pain with weightbearing, difficulty ambulating, difficulty arising from a kneeling position and difficulty arising from chair. The patient feels that they are doing poorly and report their pain level to be moderate to severe. The following medication has been used for pain control: Tylenol. Unfortunately, her knees did not improve with Euflexxa. Her left knee bothers her more than her right knee. Pain has been getting worse over the past six months to a year. It is now limiting what she can and cannot do. She was an avid runner in the past, but had to give up running several years ago because of the knee pain. It is now limiting her ability to do just about any exercise activities she desires. She is at a stage now she feels like she needs to do something in order to become more active again. She is ready to proceed with surgery. They have been treated conservatively in the past for the above stated problem and despite conservative measures, they continue to have progressive pain and severe functional limitations and dysfunction. They have failed non-operative management including home exercise, medications, and injections. It is felt that they would benefit from undergoing total joint replacement. Risks and benefits of the procedure have been discussed  with the patient and they elect to proceed with surgery. There are no active contraindications to surgery such as ongoing infection or rapidly progressive neurological disease.  Problem List/Past Medical  Primary osteoarthritis of both knees (M17.0)  Anxiety Disorder  Cardiac Arrhythmia  Depression  Gastroesophageal Reflux Disease  Hypothyroidism  Atrial Fibrillation  Paroxsymal Irritable bowel syndrome  Menopause  Measles  Mumps  Hypothyroidism  Borderline  Allergies Codeine Sulfate *ANALGESICS - OPIOID*  Rash. Patient IS ABLE to take Hydrocodone.  Family History  Cancer  Brother, Maternal Grandfather, Maternal Grandmother, Mother. Depression  Mother, Paternal Jon Gills, Paternal Grandmother. Drug / Alcohol Addiction  Paternal Grandfather. Heart Disease  Father. Hypertension  Mother. Osteoarthritis  Father, Mother. Osteoporosis  Mother.  Social History Tobacco use  Former smoker. 08/28/2015: smoke(d) less than 1/2 pack(s) per day Children  2 Current work status  retired Furniture conservator/restorer weekly; does gym / [employer] Former drinker  08/28/2015: In the past drank Living situation  live with spouse Marital status  married No history of drug/alcohol rehab  Not under pain contract  [place] with husband [name]  Medication History  Cymbalta (60MG  Capsule DR Part, Oral) Active. NexIUM (20MG  Capsule DR, Oral) Active. Rythmol SR (325MG  Capsule ER 12HR, Oral) Active. Eliquis (5MG  Tablet, Oral) Active. Celecoxib (200MG  Capsule, Oral) Active. Pravastatin Sodium (40MG  Tablet, Oral) Active. Armour Thyroid (30MG  Tablet, Oral) Active. ROPINIRole HCl (1MG  Tablet, Oral) Active. Zantac (150MG  Tablet, Oral) Active. Multiple Vitamin (1 (one) Oral) Active. Vitamin C (100MG  Tablet, Oral) Active. Co Q10 (100MG  Capsule, Oral) Active. Magnesium (70MG  Capsule, Oral) Active.  Past Surgical History Anal Fissure Repair  Colon  Polyp Removal - Colonoscopy  Hysterectomy  partial (cancerous) Tonsillectomy  Cardiac Ablation    Review of Systems  General Not Present- Chills, Fatigue, Fever, Memory Loss, Night Sweats, Weight Gain and Weight Loss. Skin Not Present- Eczema, Hives, Itching, Lesions and Rash. HEENT Not Present- Dentures, Double Vision, Headache, Hearing Loss, Tinnitus and Visual Loss. Respiratory Not Present- Allergies, Chronic Cough, Coughing up blood, Shortness of breath at rest and Shortness of breath with exertion. Cardiovascular Not Present- Chest Pain, Difficulty Breathing Lying Down, Murmur, Palpitations, Racing/skipping heartbeats and Swelling. Gastrointestinal Not Present- Abdominal Pain, Bloody Stool, Constipation, Diarrhea, Difficulty Swallowing, Heartburn, Jaundice, Loss of appetitie, Nausea and Vomiting. Female Genitourinary Not Present- Blood in Urine, Discharge, Flank Pain, Incontinence, Painful Urination, Urgency, Urinary frequency, Urinary Retention, Urinating at Night and Weak urinary stream. Musculoskeletal Present- Joint Pain, Morning Stiffness and Muscle Pain. Not Present- Back Pain, Joint Swelling, Muscle Weakness and Spasms. Neurological Not Present- Blackout spells, Difficulty with balance, Dizziness, Paralysis, Tremor and Weakness. Psychiatric Not Present- Insomnia.  Vitals Weight: 182 lb Height: 62in Body Surface Area: 1.84 m Body Mass Index: 33.29 kg/m  Pulse: 76 (Regular)  BP: 132/70 (Sitting, Right Arm, Standard  Physical Exam General Mental Status -Alert, cooperative and good historian. General Appearance-pleasant, Not in acute distress. Orientation-Oriented X3. Build & Nutrition-Well nourished and Well developed.  Head and Neck Head-normocephalic, atraumatic . Neck Global Assessment - supple, no bruit auscultated on the right, no bruit auscultated on the left.  Eye Vision-Wears corrective lenses. Pupil - Bilateral-Regular and  Round. Motion - Bilateral-EOMI.  Chest and Lung Exam Auscultation Breath sounds - clear at anterior chest wall and clear at posterior chest wall. Adventitious sounds - No Adventitious sounds.  Cardiovascular Auscultation Rhythm - Regular rate and rhythm. Heart Sounds - S1 WNL and S2 WNL. Murmurs & Other Heart Sounds - Auscultation of the heart reveals - No Murmurs.  Abdomen Palpation/Percussion Tenderness - Abdomen is non-tender to palpation. Rigidity (guarding) - Abdomen is soft. Auscultation Auscultation of the abdomen reveals - Bowel sounds normal.  Female Genitourinary Note: Not done, not pertinent to present illness   Musculoskeletal Note: On exam, she is a very pleasant, well-developed female, alert and oriented, in no apparent distress. Her hips show normal range of motion, no discomfort. Her left knee shows no effusion. Her range is about 0 to 125. Very tender medially with no lateral tenderness or any instability noted. Right knee, no effusion. Slight crepitus on range of motion. Range about 0 to 130 with some tenderness, medial greater than lateral, no instability noted. Pulse, sensation and motor are intact in both lower extremities.  RADIOGRAPHS I reviewed her radiographs, AP both knees and lateral both from a few months ago, and she does have significant medial narrowing, just about bone on bone on the left than. On the right side, similar medial compartment, but also with patellofemoral narrowing. On the left, the patellofemoral lateral compartments are normal.  Assessment & Plan Primary osteoarthritis of right knee (M17.11) Primary osteoarthritis of left knee (M17.12)  Note:Surgical Plans: Left Knee Unicompartmental Replacement  Disposition: Home  PCP: Dr. Veronia Beets  IV TXA  Anesthesia Issues: None  Signed electronically by Ok Edwards, III PA-C

## 2016-08-10 ENCOUNTER — Encounter (HOSPITAL_COMMUNITY)
Admission: RE | Disposition: A | Discharge status: Home / Self Care | Payer: Self-pay | Source: Ambulatory Visit | Attending: Orthopedic Surgery

## 2016-08-10 ENCOUNTER — Observation Stay (HOSPITAL_COMMUNITY)
Admission: RE | Admit: 2016-08-10 | Discharge: 2016-08-11 | Disposition: A | Discharge status: Home / Self Care | Payer: Medicare Other | Source: Ambulatory Visit | Attending: Orthopedic Surgery | Admitting: Orthopedic Surgery

## 2016-08-10 ENCOUNTER — Encounter (HOSPITAL_COMMUNITY): Payer: Self-pay | Admitting: *Deleted

## 2016-08-10 ENCOUNTER — Ambulatory Visit (HOSPITAL_COMMUNITY): Payer: Medicare Other | Admitting: Anesthesiology

## 2016-08-10 DIAGNOSIS — F419 Anxiety disorder, unspecified: Secondary | ICD-10-CM | POA: Insufficient documentation

## 2016-08-10 DIAGNOSIS — Z8269 Family history of other diseases of the musculoskeletal system and connective tissue: Secondary | ICD-10-CM | POA: Diagnosis not present

## 2016-08-10 DIAGNOSIS — M179 Osteoarthritis of knee, unspecified: Secondary | ICD-10-CM

## 2016-08-10 DIAGNOSIS — M1710 Unilateral primary osteoarthritis, unspecified knee: Secondary | ICD-10-CM

## 2016-08-10 DIAGNOSIS — F329 Major depressive disorder, single episode, unspecified: Secondary | ICD-10-CM | POA: Diagnosis not present

## 2016-08-10 DIAGNOSIS — M170 Bilateral primary osteoarthritis of knee: Secondary | ICD-10-CM | POA: Diagnosis not present

## 2016-08-10 DIAGNOSIS — M25761 Osteophyte, right knee: Secondary | ICD-10-CM | POA: Diagnosis not present

## 2016-08-10 DIAGNOSIS — I480 Paroxysmal atrial fibrillation: Secondary | ICD-10-CM | POA: Insufficient documentation

## 2016-08-10 DIAGNOSIS — E039 Hypothyroidism, unspecified: Secondary | ICD-10-CM | POA: Diagnosis not present

## 2016-08-10 DIAGNOSIS — K219 Gastro-esophageal reflux disease without esophagitis: Secondary | ICD-10-CM | POA: Insufficient documentation

## 2016-08-10 DIAGNOSIS — Z87891 Personal history of nicotine dependence: Secondary | ICD-10-CM | POA: Insufficient documentation

## 2016-08-10 DIAGNOSIS — E785 Hyperlipidemia, unspecified: Secondary | ICD-10-CM | POA: Insufficient documentation

## 2016-08-10 DIAGNOSIS — Z7901 Long term (current) use of anticoagulants: Secondary | ICD-10-CM | POA: Diagnosis not present

## 2016-08-10 DIAGNOSIS — M1712 Unilateral primary osteoarthritis, left knee: Secondary | ICD-10-CM | POA: Diagnosis present

## 2016-08-10 DIAGNOSIS — Z79899 Other long term (current) drug therapy: Secondary | ICD-10-CM | POA: Insufficient documentation

## 2016-08-10 HISTORY — PX: PARTIAL KNEE ARTHROPLASTY: SHX2174

## 2016-08-10 LAB — PROTIME-INR
INR: 1
Prothrombin Time: 13.2 seconds (ref 11.4–15.2)

## 2016-08-10 LAB — TYPE AND SCREEN
ABO/RH(D): O POS
Antibody Screen: NEGATIVE

## 2016-08-10 LAB — APTT: aPTT: 28 seconds (ref 24–36)

## 2016-08-10 SURGERY — ARTHROPLASTY, KNEE, UNICOMPARTMENTAL
Anesthesia: Spinal | Site: Knee | Laterality: Left

## 2016-08-10 MED ORDER — PROPOFOL 10 MG/ML IV BOLUS
INTRAVENOUS | Status: AC
  Filled 2016-08-10: qty 20

## 2016-08-10 MED ORDER — DIPHENHYDRAMINE HCL 12.5 MG/5ML PO ELIX: 12.5000 mg | ORAL_SOLUTION | ORAL | Status: DC | PRN

## 2016-08-10 MED ORDER — ONDANSETRON HCL 4 MG/2ML IJ SOLN
INTRAMUSCULAR | Status: AC
  Filled 2016-08-10: qty 2

## 2016-08-10 MED ORDER — CEFAZOLIN SODIUM-DEXTROSE 2-4 GM/100ML-% IV SOLN
2.0000 g | INTRAVENOUS | Status: AC
  Administered 2016-08-10: 2 g via INTRAVENOUS

## 2016-08-10 MED ORDER — FENTANYL CITRATE (PF) 100 MCG/2ML IJ SOLN
INTRAMUSCULAR | Status: AC
  Administered 2016-08-10: 50 ug via INTRAVENOUS
  Filled 2016-08-10: qty 2

## 2016-08-10 MED ORDER — FENTANYL CITRATE (PF) 100 MCG/2ML IJ SOLN
INTRAMUSCULAR | Status: AC
  Filled 2016-08-10: qty 2

## 2016-08-10 MED ORDER — HYDROMORPHONE HCL 2 MG PO TABS
2.0000 mg | ORAL_TABLET | ORAL | Status: DC | PRN
  Administered 2016-08-10: 2 mg via ORAL
  Administered 2016-08-10 – 2016-08-11 (×3): 4 mg via ORAL
  Filled 2016-08-10 (×2): qty 2
  Filled 2016-08-10: qty 1
  Filled 2016-08-10: qty 2

## 2016-08-10 MED ORDER — LIDOCAINE 2% (20 MG/ML) 5 ML SYRINGE
INTRAMUSCULAR | Status: AC
  Filled 2016-08-10: qty 5

## 2016-08-10 MED ORDER — MIDAZOLAM HCL 5 MG/5ML IJ SOLN
INTRAMUSCULAR | Status: DC | PRN
  Administered 2016-08-10: 2 mg via INTRAVENOUS

## 2016-08-10 MED ORDER — PHENOL 1.4 % MT LIQD
1.0000 | OROMUCOSAL | Status: DC | PRN
Start: 2016-08-10 — End: 2016-08-11

## 2016-08-10 MED ORDER — POLYETHYLENE GLYCOL 3350 17 G PO PACK: 17.0000 g | PACK | Freq: Every day | ORAL | Status: DC | PRN

## 2016-08-10 MED ORDER — BISACODYL 10 MG RE SUPP: 10.0000 mg | Freq: Every day | RECTAL | Status: DC | PRN

## 2016-08-10 MED ORDER — MEPERIDINE HCL 50 MG/ML IJ SOLN: 6.2500 mg | INTRAMUSCULAR | Status: DC | PRN

## 2016-08-10 MED ORDER — MORPHINE SULFATE (PF) 2 MG/ML IV SOLN
1.0000 mg | INTRAVENOUS | Status: DC | PRN
  Administered 2016-08-10 – 2016-08-11 (×2): 1 mg via INTRAVENOUS
  Filled 2016-08-10 (×2): qty 1

## 2016-08-10 MED ORDER — STERILE WATER FOR IRRIGATION IR SOLN
Status: DC | PRN
  Administered 2016-08-10: 2000 mL

## 2016-08-10 MED ORDER — BUPIVACAINE LIPOSOME 1.3 % IJ SUSP
20.0000 mL | Freq: Once | INTRAMUSCULAR | Status: DC
  Filled 2016-08-10: qty 20

## 2016-08-10 MED ORDER — DEXAMETHASONE SODIUM PHOSPHATE 10 MG/ML IJ SOLN
10.0000 mg | Freq: Once | INTRAMUSCULAR | Status: AC
  Administered 2016-08-11: 10 mg via INTRAVENOUS
  Filled 2016-08-10: qty 1

## 2016-08-10 MED ORDER — CEFAZOLIN SODIUM-DEXTROSE 2-4 GM/100ML-% IV SOLN
INTRAVENOUS | Status: AC
  Filled 2016-08-10: qty 100

## 2016-08-10 MED ORDER — CEFAZOLIN SODIUM-DEXTROSE 2-4 GM/100ML-% IV SOLN
2.0000 g | Freq: Four times a day (QID) | INTRAVENOUS | Status: AC
  Administered 2016-08-10 – 2016-08-11 (×2): 2 g via INTRAVENOUS
  Filled 2016-08-10 (×2): qty 100

## 2016-08-10 MED ORDER — FLEET ENEMA 7-19 GM/118ML RE ENEM: 1.0000 | ENEMA | Freq: Once | RECTAL | Status: DC | PRN

## 2016-08-10 MED ORDER — METOCLOPRAMIDE HCL 5 MG/ML IJ SOLN: 10.0000 mg | Freq: Once | INTRAMUSCULAR | Status: DC | PRN

## 2016-08-10 MED ORDER — METOCLOPRAMIDE HCL 5 MG/ML IJ SOLN: 5.0000 mg | Freq: Three times a day (TID) | INTRAMUSCULAR | Status: DC | PRN

## 2016-08-10 MED ORDER — ONDANSETRON HCL 4 MG/2ML IJ SOLN: 4.0000 mg | Freq: Four times a day (QID) | INTRAMUSCULAR | Status: DC | PRN

## 2016-08-10 MED ORDER — FENTANYL CITRATE (PF) 100 MCG/2ML IJ SOLN
25.0000 ug | INTRAMUSCULAR | Status: DC | PRN
  Administered 2016-08-10 (×3): 50 ug via INTRAVENOUS

## 2016-08-10 MED ORDER — SODIUM CHLORIDE 0.9 % IR SOLN
Status: DC | PRN
  Administered 2016-08-10: 1000 mL

## 2016-08-10 MED ORDER — ROPINIROLE HCL 1 MG PO TABS
1.0000 mg | ORAL_TABLET | Freq: Two times a day (BID) | ORAL | Status: DC
  Administered 2016-08-10 – 2016-08-11 (×2): 1 mg via ORAL
  Filled 2016-08-10 (×2): qty 1

## 2016-08-10 MED ORDER — BUPIVACAINE IN DEXTROSE 0.75-8.25 % IT SOLN
INTRATHECAL | Status: DC | PRN
  Administered 2016-08-10: 1.5 mL via INTRATHECAL

## 2016-08-10 MED ORDER — PROPAFENONE HCL ER 325 MG PO CP12
325.0000 mg | ORAL_CAPSULE | Freq: Two times a day (BID) | ORAL | Status: DC
  Administered 2016-08-10 – 2016-08-11 (×2): 325 mg via ORAL
  Filled 2016-08-10 (×2): qty 1

## 2016-08-10 MED ORDER — BUPIVACAINE LIPOSOME 1.3 % IJ SUSP
INTRAMUSCULAR | Status: DC | PRN
  Administered 2016-08-10: 20 mL

## 2016-08-10 MED ORDER — HYDROMORPHONE HCL 1 MG/ML IJ SOLN
INTRAMUSCULAR | Status: AC
  Administered 2016-08-10: 0.5 mg via INTRAVENOUS
  Filled 2016-08-10: qty 1

## 2016-08-10 MED ORDER — ESOMEPRAZOLE MAGNESIUM 20 MG PO CPDR: 20.0000 mg | DELAYED_RELEASE_CAPSULE | Freq: Every day | ORAL | Status: DC | PRN

## 2016-08-10 MED ORDER — PHENYLEPHRINE HCL 10 MG/ML IJ SOLN
INTRAMUSCULAR | Status: DC | PRN
  Administered 2016-08-10: 80 ug via INTRAVENOUS

## 2016-08-10 MED ORDER — DEXAMETHASONE SODIUM PHOSPHATE 10 MG/ML IJ SOLN
10.0000 mg | Freq: Once | INTRAMUSCULAR | Status: AC
  Administered 2016-08-10: 10 mg via INTRAVENOUS

## 2016-08-10 MED ORDER — MENTHOL 3 MG MT LOZG
1.0000 | LOZENGE | OROMUCOSAL | Status: DC | PRN
Start: 2016-08-10 — End: 2016-08-11

## 2016-08-10 MED ORDER — ACETAMINOPHEN 10 MG/ML IV SOLN
1000.0000 mg | Freq: Once | INTRAVENOUS | Status: AC
Start: 2016-08-10 — End: 2016-08-10
  Administered 2016-08-10: 1000 mg via INTRAVENOUS
  Filled 2016-08-10: qty 100

## 2016-08-10 MED ORDER — PRAVASTATIN SODIUM 20 MG PO TABS
40.0000 mg | ORAL_TABLET | Freq: Every day | ORAL | Status: DC
  Administered 2016-08-11: 40 mg via ORAL
  Filled 2016-08-10: qty 2

## 2016-08-10 MED ORDER — HYDROMORPHONE HCL 1 MG/ML IJ SOLN
0.2500 mg | INTRAMUSCULAR | Status: DC | PRN
  Administered 2016-08-10 (×2): 0.5 mg via INTRAVENOUS

## 2016-08-10 MED ORDER — MIDAZOLAM HCL 2 MG/2ML IJ SOLN
INTRAMUSCULAR | Status: AC
  Filled 2016-08-10: qty 2

## 2016-08-10 MED ORDER — DEXTROSE 5 % IV SOLN
500.0000 mg | Freq: Four times a day (QID) | INTRAVENOUS | Status: DC | PRN
  Administered 2016-08-10: 500 mg via INTRAVENOUS
  Filled 2016-08-10: qty 5
  Filled 2016-08-10: qty 550

## 2016-08-10 MED ORDER — LACTATED RINGERS IV SOLN
INTRAVENOUS | Status: DC
  Administered 2016-08-10 (×2): via INTRAVENOUS

## 2016-08-10 MED ORDER — PHENYLEPHRINE 40 MCG/ML (10ML) SYRINGE FOR IV PUSH (FOR BLOOD PRESSURE SUPPORT)
PREFILLED_SYRINGE | INTRAVENOUS | Status: AC
  Filled 2016-08-10: qty 10

## 2016-08-10 MED ORDER — DOCUSATE SODIUM 100 MG PO CAPS
100.0000 mg | ORAL_CAPSULE | Freq: Two times a day (BID) | ORAL | Status: DC
  Administered 2016-08-10: 100 mg via ORAL
  Filled 2016-08-10 (×2): qty 1

## 2016-08-10 MED ORDER — SODIUM CHLORIDE 0.9 % IJ SOLN
INTRAMUSCULAR | Status: DC | PRN
  Administered 2016-08-10: 30 mL

## 2016-08-10 MED ORDER — METHOCARBAMOL 500 MG PO TABS
500.0000 mg | ORAL_TABLET | Freq: Four times a day (QID) | ORAL | Status: DC | PRN
  Administered 2016-08-10 – 2016-08-11 (×3): 500 mg via ORAL
  Filled 2016-08-10 (×3): qty 1

## 2016-08-10 MED ORDER — CHLORHEXIDINE GLUCONATE 4 % EX LIQD: 60.0000 mL | Freq: Once | CUTANEOUS | Status: DC

## 2016-08-10 MED ORDER — 0.9 % SODIUM CHLORIDE (POUR BTL) OPTIME
TOPICAL | Status: DC | PRN
  Administered 2016-08-10: 1000 mL

## 2016-08-10 MED ORDER — FAMOTIDINE 20 MG PO TABS: 20.0000 mg | ORAL_TABLET | Freq: Two times a day (BID) | ORAL | Status: DC | PRN

## 2016-08-10 MED ORDER — METOCLOPRAMIDE HCL 5 MG PO TABS: 5.0000 mg | ORAL_TABLET | Freq: Three times a day (TID) | ORAL | Status: DC | PRN

## 2016-08-10 MED ORDER — ACETAMINOPHEN 10 MG/ML IV SOLN
INTRAVENOUS | Status: AC
  Filled 2016-08-10: qty 100

## 2016-08-10 MED ORDER — BUPIVACAINE HCL (PF) 0.25 % IJ SOLN
INTRAMUSCULAR | Status: DC | PRN
  Administered 2016-08-10: 20 mL

## 2016-08-10 MED ORDER — DULOXETINE HCL 60 MG PO CPEP
60.0000 mg | ORAL_CAPSULE | Freq: Every day | ORAL | Status: DC
  Administered 2016-08-10: 60 mg via ORAL
  Filled 2016-08-10: qty 1

## 2016-08-10 MED ORDER — BUPIVACAINE HCL (PF) 0.25 % IJ SOLN
INTRAMUSCULAR | Status: AC
  Filled 2016-08-10: qty 30

## 2016-08-10 MED ORDER — APIXABAN 5 MG PO TABS
5.0000 mg | ORAL_TABLET | Freq: Two times a day (BID) | ORAL | Status: DC
  Administered 2016-08-11: 5 mg via ORAL
  Filled 2016-08-10: qty 1

## 2016-08-10 MED ORDER — ONDANSETRON HCL 4 MG PO TABS: 4.0000 mg | ORAL_TABLET | Freq: Four times a day (QID) | ORAL | Status: DC | PRN

## 2016-08-10 MED ORDER — ACETAMINOPHEN 325 MG PO TABS
650.0000 mg | ORAL_TABLET | Freq: Four times a day (QID) | ORAL | Status: DC | PRN
Start: 2016-08-11 — End: 2016-08-11

## 2016-08-10 MED ORDER — TRANEXAMIC ACID 1000 MG/10ML IV SOLN
1000.0000 mg | INTRAVENOUS | Status: AC
  Administered 2016-08-10: 1000 mg via INTRAVENOUS
  Filled 2016-08-10: qty 10

## 2016-08-10 MED ORDER — SODIUM CHLORIDE 0.9 % IJ SOLN
INTRAMUSCULAR | Status: AC
  Filled 2016-08-10: qty 50

## 2016-08-10 MED ORDER — DOXYLAMINE SUCCINATE (SLEEP) 25 MG PO TABS: 25.0000 mg | ORAL_TABLET | Freq: Every evening | ORAL | Status: DC | PRN

## 2016-08-10 MED ORDER — PROPOFOL 500 MG/50ML IV EMUL
INTRAVENOUS | Status: DC | PRN
  Administered 2016-08-10: 20 mg via INTRAVENOUS
  Administered 2016-08-10: 30 mg via INTRAVENOUS

## 2016-08-10 MED ORDER — ACETAMINOPHEN 650 MG RE SUPP
650.0000 mg | Freq: Four times a day (QID) | RECTAL | Status: DC | PRN
Start: 2016-08-11 — End: 2016-08-11

## 2016-08-10 MED ORDER — SODIUM CHLORIDE 0.9 % IV SOLN
INTRAVENOUS | Status: DC
  Administered 2016-08-10: 17:00:00 via INTRAVENOUS

## 2016-08-10 MED ORDER — DEXAMETHASONE SODIUM PHOSPHATE 10 MG/ML IJ SOLN
INTRAMUSCULAR | Status: AC
  Filled 2016-08-10: qty 1

## 2016-08-10 MED ORDER — THYROID 30 MG PO TABS
30.0000 mg | ORAL_TABLET | Freq: Every day | ORAL | Status: DC
  Administered 2016-08-11: 30 mg via ORAL
  Filled 2016-08-10: qty 1

## 2016-08-10 MED ORDER — ONDANSETRON HCL 4 MG/2ML IJ SOLN
INTRAMUSCULAR | Status: DC | PRN
  Administered 2016-08-10: 4 mg via INTRAVENOUS

## 2016-08-10 MED ORDER — PROPOFOL 500 MG/50ML IV EMUL
INTRAVENOUS | Status: DC | PRN
  Administered 2016-08-10: 75 ug/kg/min via INTRAVENOUS

## 2016-08-10 MED ORDER — ACETAMINOPHEN 500 MG PO TABS
1000.0000 mg | ORAL_TABLET | Freq: Four times a day (QID) | ORAL | Status: DC
  Administered 2016-08-10 – 2016-08-11 (×3): 1000 mg via ORAL
  Filled 2016-08-10 (×3): qty 2

## 2016-08-10 MED ORDER — PROPOFOL 10 MG/ML IV BOLUS
INTRAVENOUS | Status: AC
  Filled 2016-08-10: qty 40

## 2016-08-10 SURGICAL SUPPLY — 46 items
BAG ZIPLOCK 12X15 (MISCELLANEOUS) ×3 IMPLANT
BANDAGE ACE 6X5 VEL STRL LF (GAUZE/BANDAGES/DRESSINGS) ×3 IMPLANT
BLADE SAW RECIPROCATING 77.5 (BLADE) ×3 IMPLANT
BLADE SAW SGTL 13.0X1.19X90.0M (BLADE) ×3 IMPLANT
BOWL SMART MIX CTS (DISPOSABLE) ×3 IMPLANT
BUR OVAL CARBIDE 4.0 (BURR) ×3 IMPLANT
CAPT KNEE PARTIAL 2 ×2 IMPLANT
CEMENT HV SMART SET (Cement) ×3 IMPLANT
CLOSURE WOUND 1/2 X4 (GAUZE/BANDAGES/DRESSINGS) ×2
CLOTH BEACON ORANGE TIMEOUT ST (SAFETY) ×3 IMPLANT
CUFF TOURN SGL QUICK 34 (TOURNIQUET CUFF) ×2
CUFF TRNQT CYL 34X4X40X1 (TOURNIQUET CUFF) ×1 IMPLANT
DRSG ADAPTIC 3X8 NADH LF (GAUZE/BANDAGES/DRESSINGS) ×3 IMPLANT
DRSG PAD ABDOMINAL 8X10 ST (GAUZE/BANDAGES/DRESSINGS) ×3 IMPLANT
DURAPREP 26ML APPLICATOR (WOUND CARE) ×3 IMPLANT
ELECT REM PT RETURN 9FT ADLT (ELECTROSURGICAL) ×3
ELECTRODE REM PT RTRN 9FT ADLT (ELECTROSURGICAL) ×1 IMPLANT
EVACUATOR 1/8 PVC DRAIN (DRAIN) ×3 IMPLANT
GAUZE SPONGE 4X4 12PLY STRL (GAUZE/BANDAGES/DRESSINGS) ×3 IMPLANT
GLOVE BIO SURGEON STRL SZ7 (GLOVE) ×3 IMPLANT
GLOVE BIO SURGEON STRL SZ7.5 (GLOVE) ×6 IMPLANT
GLOVE BIO SURGEON STRL SZ8 (GLOVE) ×3 IMPLANT
GLOVE BIOGEL PI IND STRL 8 (GLOVE) ×2 IMPLANT
GLOVE BIOGEL PI INDICATOR 8 (GLOVE) ×4
GLOVE SURG SS PI 7.0 STRL IVOR (GLOVE) ×6 IMPLANT
GLOVE SURG SS PI 7.5 STRL IVOR (GLOVE) ×3 IMPLANT
GOWN STRL REUS W/ TWL XL LVL3 (GOWN DISPOSABLE) ×1 IMPLANT
GOWN STRL REUS W/TWL LRG LVL3 (GOWN DISPOSABLE) ×3 IMPLANT
GOWN STRL REUS W/TWL XL LVL3 (GOWN DISPOSABLE) ×8 IMPLANT
HANDPIECE INTERPULSE COAX TIP (DISPOSABLE) ×2
IMMOBILIZER KNEE 20 (SOFTGOODS) ×3
IMMOBILIZER KNEE 20 THIGH 36 (SOFTGOODS) ×1 IMPLANT
KIT IMPL STRL TIB IPOLY IUNI ×1 IMPLANT
MANIFOLD NEPTUNE II (INSTRUMENTS) ×3 IMPLANT
PACK TOTAL KNEE CUSTOM (KITS) ×3 IMPLANT
PAD ABD 8X10 STRL (GAUZE/BANDAGES/DRESSINGS) ×3 IMPLANT
PADDING CAST COTTON 6X4 STRL (CAST SUPPLIES) ×6 IMPLANT
POSITIONER SURGICAL ARM (MISCELLANEOUS) ×3 IMPLANT
SET HNDPC FAN SPRY TIP SCT (DISPOSABLE) ×1 IMPLANT
STRIP CLOSURE SKIN 1/2X4 (GAUZE/BANDAGES/DRESSINGS) ×4 IMPLANT
SUT MNCRL AB 4-0 PS2 18 (SUTURE) ×3 IMPLANT
SUT VIC AB 2-0 CT1 27 (SUTURE) ×4
SUT VIC AB 2-0 CT1 TAPERPNT 27 (SUTURE) ×2 IMPLANT
SUT VLOC 180 0 24IN GS25 (SUTURE) ×3 IMPLANT
SYR 50ML LL SCALE MARK (SYRINGE) ×3 IMPLANT
WRAP KNEE MAXI GEL POST OP (GAUZE/BANDAGES/DRESSINGS) ×3 IMPLANT

## 2016-08-10 NOTE — Anesthesia Postprocedure Evaluation (Signed)
Anesthesia Post Note  Patient: CASIDY TAUBMAN  Procedure(s) Performed: Procedure(s) (LRB): LEFT KNEE UNICOMPARTMENTAL ARTHROPLASTY (Left)  Patient location during evaluation: PACU Anesthesia Type: Spinal Level of consciousness: awake and alert Pain management: pain level controlled Vital Signs Assessment: post-procedure vital signs reviewed and stable Respiratory status: spontaneous breathing and respiratory function stable Cardiovascular status: blood pressure returned to baseline and stable Postop Assessment: no headache, no backache and spinal receding Anesthetic complications: no    Last Vitals:  Vitals:   08/10/16 1445 08/10/16 1500  BP: 137/82 140/80  Pulse: 72   Resp: 15   Temp:      Last Pain:  Vitals:   08/10/16 1500  TempSrc:   PainSc: 3     LLE Motor Response: Purposeful movement (08/10/16 1500) LLE Sensation: Decreased;Numbness (08/10/16 1500) RLE Motor Response: Purposeful movement (08/10/16 1500) RLE Sensation: Decreased;Numbness (08/10/16 1500) L Sensory Level: S1-Sole of foot, small toes (08/10/16 1500) R Sensory Level: S1-Sole of foot, small toes (08/10/16 1500)  Montez Hageman

## 2016-08-10 NOTE — Telephone Encounter (Signed)
error 

## 2016-08-10 NOTE — Op Note (Signed)
OPERATIVE REPORT-UNICOMPARTMENTAL ARTHROPLASTY  PREOPERATIVE DIAGNOSIS: Medial compartment osteoarthritis, Left knee  POSTOPERATIVE DIAGNOSIS: Medial compartment osteoarthritis, Left knee  PROCEDURE:Left knee medial unicompartmental arthroplasty.   SURGEON: Gaynelle Arabian, MD   ASSISTANT: Arlee Muslim, PA-C  ANESTHESIA:  Spinal.   ESTIMATED BLOOD LOSS: Minimal.   DRAINS: Hemovac x1.   TOURNIQUET TIME:  35 minutes @ XX123456 mm Hg  COMPLICATIONS: None.   CONDITION: Stable to recovery.   BRIEF CLINICAL NOTE:Frances Smith is a 70 y.o. female, who has  significant isolated medial compartment arthritis of the Left knee. The patient has had nonoperative management including injections of cortisone and viscous supplements. Unfortunately, the pain persists.  Radiograph showed isolated medial compartment bone-on-bone arthritis  with normal-appearing patellofemoral and lateral compartments. The patient presents now for left knee unicompartmental arthroplasty.   PROCEDURE IN DETAIL: After successful administration of  Spinal anesthetic, a tourniquet was placed high on the  Left thigh and the Left lower extremity prepped and draped in usual sterile fashion. Extremity was wrapped in an Esmarch, knee flexed, and tourniquet inflated to 300 mmHg.       A midline incision was made with a 10 blade through subcutaneous  tissue to the extensor mechanism. A fresh blade was used to make a  medial parapatellar arthrotomy. Soft tissue on the proximal medial  tibia subperiosteally elevated to the joint line with a knife and into  the semimembranosus bursa with a Cobb elevator. The patella was  subluxed laterally, and the knee flexed 90 degrees. The ACL was intact.  The marginal osteophytes on the medial femur and tibia were removed with  a rongeur. The medial meniscus was also removed. The femoral cutting  block for the conformis unicompartmental knee system was placed along  the femur. There  was excellent fit. I traced the outline. We then  removed any remaining cartilage within this outline. We then placed the  cutting block again and pinned in position. The posterior femoral cut  was made, it was approximately 5 mm. The lug holes for the femoral  component were then drilled through the cutting block. The cutting  block was subsequently removed. We then utilized the high speed burr to  create a small trough at the superior aspect of the component tomake it inset and would not overhang the cartilage. The trial was placed,  it had excellent fit. The trial was subsequently removed.       The trial was placed again and the B chip was placed. There was  excellent balance throughout full motion. Also with excellent fit on  the tibia. This was removed as was the femoral trial. A curette was  used to remove any remaining cartilage from the tibia. The tibial  cutting block was then placed and there was a perfect fit on the tibial  surface. The appropriate slope was placed and it was pinned in  position. The reciprocating saw was used to make the central cut and  then the oscillating saw used to make the horizontal cut. The bone  fragment was then removed. The tibial trial was placed and had perfect  fit on the tibia. We then drilled the 2 lug holes and did the keel punch.  We then placed tibia trial and femur trial, and a 6 mm trial insert. There was  excellent stability throughout full range of motion and no impingement.  The trial was then removed. We drilled small holes in the distal  femur in order to create more conduits for the  cement. The cut bone  surfaces were thoroughly irrigated with pulsatile lavage while the  cement was mixed on the back table. We then cemented the tibial  component into place, impacted it and removed the extruded cement. The  same was done for the femoral component. Trial 6-mm inserts placed,  knee held in full extension, and all extruded cement removed.  While the  cement was hardening, I injected the extensor mechanism, periosteum of  the femur and subcu tissues, a total of 20 mL of Exparel mixed with 30  mL of saline and then did an additional injection of 20 mL of 0.25%  Marcaine into the same tissues. When the cement had fully hardened,  then the permanent polyethylene was placed in tibial tray. There was  excellent stability throughout full range of motion with no lift off the  component and no evidence of any impingement.       Wound was copiously irrigated with saline solution, and the arthrotomy closed over a Hemovac drain with a running #1 V-Loc suture. The subcutaneous was closed with  interrupted 2-0 Vicryl and subcuticular running 4-0 Monocryl. The drain  was hooked to suction. Incision cleaned and dried and Steri-Strips and  a bulky sterile dressing applied. The tourniquet was released after a  total time of 35 minutes. This was done after closing the extensor  mechanism. The wound was closed and a bulky sterile dressing was  applied. The operative limb was placed into a knee immobilizer, and the patient awakened and transported to recovery room in stable condition.       Please note that a surgical assistant was a medical necessity for this  procedure in order to perform it in a safe and expeditious manner.  Assistance was necessary for retracting vital ligaments, neurovascular  structures, as well as for proper positioning of the limb to allow for  appropriate bone cuts and appropriate placement of the prosthesis.    Frances Plover Rosi Secrist, MD

## 2016-08-10 NOTE — H&P (View-Only) (Signed)
Frances Smith DOB: [birth year] Married / Language: English / Race: White Female Date of Admission:  08/10/2016 CC:  Left Knee Pain History of Present Illness  The patient is a 70 year old female who comes in  for a preoperative History and Physical. The patient is scheduled for a left unicompartmental knee replacement to be performed by Dr. Dione Plover. Aluisio, MD at [facility] on 08/10/2016. The patient is a 70 year old female who present for follow up of their knee. The patient is being followed for their bilateral knee pain and osteoarthritis. They are now months out from Clara series. Symptoms reported include: pain (left worse than right), aching, pain with weightbearing, difficulty ambulating, difficulty arising from a kneeling position and difficulty arising from chair. The patient feels that they are doing poorly and report their pain level to be moderate to severe. The following medication has been used for pain control: Tylenol. Unfortunately, her knees did not improve with Euflexxa. Her left knee bothers her more than her right knee. Pain has been getting worse over the past six months to a year. It is now limiting what she can and cannot do. She was an avid runner in the past, but had to give up running several years ago because of the knee pain. It is now limiting her ability to do just about any exercise activities she desires. She is at a stage now she feels like she needs to do something in order to become more active again. She is ready to proceed with surgery. They have been treated conservatively in the past for the above stated problem and despite conservative measures, they continue to have progressive pain and severe functional limitations and dysfunction. They have failed non-operative management including home exercise, medications, and injections. It is felt that they would benefit from undergoing total joint replacement. Risks and benefits of the procedure have been discussed  with the patient and they elect to proceed with surgery. There are no active contraindications to surgery such as ongoing infection or rapidly progressive neurological disease.  Problem List/Past Medical  Primary osteoarthritis of both knees (M17.0)  Anxiety Disorder  Cardiac Arrhythmia  Depression  Gastroesophageal Reflux Disease  Hypothyroidism  Atrial Fibrillation  Paroxsymal Irritable bowel syndrome  Menopause  Measles  Mumps  Hypothyroidism  Borderline  Allergies Codeine Sulfate *ANALGESICS - OPIOID*  Rash. Patient IS ABLE to take Hydrocodone.  Family History  Cancer  Brother, Maternal Grandfather, Maternal Grandmother, Mother. Depression  Mother, Paternal Jon Gills, Paternal Grandmother. Drug / Alcohol Addiction  Paternal Grandfather. Heart Disease  Father. Hypertension  Mother. Osteoarthritis  Father, Mother. Osteoporosis  Mother.  Social History Tobacco use  Former smoker. 08/28/2015: smoke(d) less than 1/2 pack(s) per day Children  2 Current work status  retired Furniture conservator/restorer weekly; does gym / [employer] Former drinker  08/28/2015: In the past drank Living situation  live with spouse Marital status  married No history of drug/alcohol rehab  Not under pain contract  [place] with husband [name]  Medication History  Cymbalta (60MG  Capsule DR Part, Oral) Active. NexIUM (20MG  Capsule DR, Oral) Active. Rythmol SR (325MG  Capsule ER 12HR, Oral) Active. Eliquis (5MG  Tablet, Oral) Active. Celecoxib (200MG  Capsule, Oral) Active. Pravastatin Sodium (40MG  Tablet, Oral) Active. Armour Thyroid (30MG  Tablet, Oral) Active. ROPINIRole HCl (1MG  Tablet, Oral) Active. Zantac (150MG  Tablet, Oral) Active. Multiple Vitamin (1 (one) Oral) Active. Vitamin C (100MG  Tablet, Oral) Active. Co Q10 (100MG  Capsule, Oral) Active. Magnesium (70MG  Capsule, Oral) Active.  Past Surgical History Anal Fissure Repair  Colon  Polyp Removal - Colonoscopy  Hysterectomy  partial (cancerous) Tonsillectomy  Cardiac Ablation    Review of Systems  General Not Present- Chills, Fatigue, Fever, Memory Loss, Night Sweats, Weight Gain and Weight Loss. Skin Not Present- Eczema, Hives, Itching, Lesions and Rash. HEENT Not Present- Dentures, Double Vision, Headache, Hearing Loss, Tinnitus and Visual Loss. Respiratory Not Present- Allergies, Chronic Cough, Coughing up blood, Shortness of breath at rest and Shortness of breath with exertion. Cardiovascular Not Present- Chest Pain, Difficulty Breathing Lying Down, Murmur, Palpitations, Racing/skipping heartbeats and Swelling. Gastrointestinal Not Present- Abdominal Pain, Bloody Stool, Constipation, Diarrhea, Difficulty Swallowing, Heartburn, Jaundice, Loss of appetitie, Nausea and Vomiting. Female Genitourinary Not Present- Blood in Urine, Discharge, Flank Pain, Incontinence, Painful Urination, Urgency, Urinary frequency, Urinary Retention, Urinating at Night and Weak urinary stream. Musculoskeletal Present- Joint Pain, Morning Stiffness and Muscle Pain. Not Present- Back Pain, Joint Swelling, Muscle Weakness and Spasms. Neurological Not Present- Blackout spells, Difficulty with balance, Dizziness, Paralysis, Tremor and Weakness. Psychiatric Not Present- Insomnia.  Vitals Weight: 182 lb Height: 62in Body Surface Area: 1.84 m Body Mass Index: 33.29 kg/m  Pulse: 76 (Regular)  BP: 132/70 (Sitting, Right Arm, Standard  Physical Exam General Mental Status -Alert, cooperative and good historian. General Appearance-pleasant, Not in acute distress. Orientation-Oriented X3. Build & Nutrition-Well nourished and Well developed.  Head and Neck Head-normocephalic, atraumatic . Neck Global Assessment - supple, no bruit auscultated on the right, no bruit auscultated on the left.  Eye Vision-Wears corrective lenses. Pupil - Bilateral-Regular and  Round. Motion - Bilateral-EOMI.  Chest and Lung Exam Auscultation Breath sounds - clear at anterior chest wall and clear at posterior chest wall. Adventitious sounds - No Adventitious sounds.  Cardiovascular Auscultation Rhythm - Regular rate and rhythm. Heart Sounds - S1 WNL and S2 WNL. Murmurs & Other Heart Sounds - Auscultation of the heart reveals - No Murmurs.  Abdomen Palpation/Percussion Tenderness - Abdomen is non-tender to palpation. Rigidity (guarding) - Abdomen is soft. Auscultation Auscultation of the abdomen reveals - Bowel sounds normal.  Female Genitourinary Note: Not done, not pertinent to present illness   Musculoskeletal Note: On exam, she is a very pleasant, well-developed female, alert and oriented, in no apparent distress. Her hips show normal range of motion, no discomfort. Her left knee shows no effusion. Her range is about 0 to 125. Very tender medially with no lateral tenderness or any instability noted. Right knee, no effusion. Slight crepitus on range of motion. Range about 0 to 130 with some tenderness, medial greater than lateral, no instability noted. Pulse, sensation and motor are intact in both lower extremities.  RADIOGRAPHS I reviewed her radiographs, AP both knees and lateral both from a few months ago, and she does have significant medial narrowing, just about bone on bone on the left than. On the right side, similar medial compartment, but also with patellofemoral narrowing. On the left, the patellofemoral lateral compartments are normal.  Assessment & Plan Primary osteoarthritis of right knee (M17.11) Primary osteoarthritis of left knee (M17.12)  Note:Surgical Plans: Left Knee Unicompartmental Replacement  Disposition: Home  PCP: Dr. Veronia Beets  IV TXA  Anesthesia Issues: None  Signed electronically by Ok Edwards, III PA-C

## 2016-08-10 NOTE — Transfer of Care (Signed)
Immediate Anesthesia Transfer of Care Note  Patient: Frances Smith  Procedure(s) Performed: Procedure(s): LEFT KNEE UNICOMPARTMENTAL ARTHROPLASTY (Left)  Patient Location: PACU  Anesthesia Type:Spinal  Level of Consciousness: awake, alert  and oriented  Airway & Oxygen Therapy: Patient Spontanous Breathing and Patient connected to face mask oxygen  Post-op Assessment: Report given to RN and Post -op Vital signs reviewed and stable  Post vital signs: Reviewed and stable  Last Vitals:  Vitals:   08/10/16 0924  BP: (!) 153/86  Pulse: 80  Resp: 16  Temp: 36.9 C    Last Pain:  Vitals:   08/10/16 0953  TempSrc:   PainSc: 5       Patients Stated Pain Goal: 3 ([id number])  Complications: No apparent anesthesia complications

## 2016-08-10 NOTE — Anesthesia Preprocedure Evaluation (Addendum)
Anesthesia Evaluation  Patient identified by MRN, date of birth, ID band Patient awake    Reviewed: Allergy & Precautions, NPO status , Patient's Chart, lab work & pertinent test results  Airway Mallampati: II  TM Distance: >3 FB Neck ROM: Full    Dental no notable dental hx. (+) Caps, Implants   Pulmonary neg pulmonary ROS, former smoker,    Pulmonary exam normal breath sounds clear to auscultation       Cardiovascular Normal cardiovascular exam+ dysrhythmias Atrial Fibrillation  Rhythm:Regular Rate:Normal     Neuro/Psych negative neurological ROS  negative psych ROS   GI/Hepatic negative GI ROS, Neg liver ROS,   Endo/Other  negative endocrine ROS  Renal/GU negative Renal ROS  negative genitourinary   Musculoskeletal negative musculoskeletal ROS (+)   Abdominal   Peds negative pediatric ROS (+)  Hematology negative hematology ROS (+)   Anesthesia Other Findings   Reproductive/Obstetrics negative OB ROS                            Anesthesia Physical Anesthesia Plan  ASA: II  Anesthesia Plan: Spinal   Post-op Pain Management:    Induction:   Airway Management Planned: Simple Face Mask  Additional Equipment:   Intra-op Plan:   Post-operative Plan:   Informed Consent: I have reviewed the patients History and Physical, chart, labs and discussed the procedure including the risks, benefits and alternatives for the proposed anesthesia with the patient or authorized representative who has indicated his/her understanding and acceptance.   Dental advisory given  Plan Discussed with: CRNA  Anesthesia Plan Comments: (Off Eliquis since 9/14. SAB)        Anesthesia Quick Evaluation

## 2016-08-10 NOTE — Interval H&P Note (Signed)
History and Physical Interval Note:  08/10/2016 11:37 AM  Frances Smith  has presented today for surgery, with the diagnosis of LEFT KNEE MEDIAL COMPARTMENT OA  The various methods of treatment have been discussed with the patient and family. After consideration of risks, benefits and other options for treatment, the patient has consented to  Procedure(s): LEFT KNEE UNICOMPARTMENTAL ARTHROPLASTY (Left) as a surgical intervention .  The patient's history has been reviewed, patient examined, no change in status, stable for surgery.  I have reviewed the patient's chart and labs.  Questions were answered to the patient's satisfaction.     Gearlean Alf

## 2016-08-10 NOTE — Anesthesia Procedure Notes (Signed)
Spinal  Patient location during procedure: OR Staffing Anesthesiologist: Montez Hageman Performed: anesthesiologist  Preanesthetic Checklist Completed: patient identified, site marked, surgical consent, pre-op evaluation, timeout performed, IV checked, risks and benefits discussed and monitors and equipment checked Spinal Block Patient position: sitting Prep: Betadine Patient monitoring: heart rate, continuous pulse ox and blood pressure Approach: right paramedian Location: L4-5 Injection technique: single-shot Needle Needle type: Sprotte  Needle gauge: 24 G Needle length: 9 cm Additional Notes Expiration date of kit checked and confirmed. Patient tolerated procedure well, without complications.

## 2016-08-11 DIAGNOSIS — M170 Bilateral primary osteoarthritis of knee: Secondary | ICD-10-CM | POA: Diagnosis not present

## 2016-08-11 LAB — BASIC METABOLIC PANEL
Anion gap: 9 (ref 5–15)
BUN: 16 mg/dL (ref 6–20)
CALCIUM: 9.2 mg/dL (ref 8.9–10.3)
CO2: 26 mmol/L (ref 22–32)
Chloride: 105 mmol/L (ref 101–111)
Creatinine, Ser: 0.69 mg/dL (ref 0.44–1.00)
GFR calc Af Amer: >60 mL/min (ref >60)
Glucose, Bld: 118 mg/dL — ABNORMAL HIGH (ref 65–99)
POTASSIUM: 4.1 mmol/L (ref 3.5–5.1)
SODIUM: 140 mmol/L (ref 135–145)

## 2016-08-11 LAB — CBC
HCT: 41.3 % (ref 36.0–46.0)
HEMOGLOBIN: 13.6 g/dL (ref 12.0–15.0)
MCH: 30.7 pg (ref 26.0–34.0)
MCHC: 32.9 g/dL (ref 30.0–36.0)
MCV: 93.2 fL (ref 78.0–100.0)
PLATELETS: 199 10*3/uL (ref 150–400)
RBC: 4.43 MIL/uL (ref 3.87–5.11)
RDW: 13.3 % (ref 11.5–15.5)
WBC: 11.3 10*3/uL — ABNORMAL HIGH (ref 4.0–10.5)

## 2016-08-11 MED ORDER — HYDROMORPHONE HCL 2 MG PO TABS: 2.0000 mg | ORAL_TABLET | ORAL | 0 refills | Status: DC | PRN

## 2016-08-11 MED ORDER — METHOCARBAMOL 500 MG PO TABS: 500.0000 mg | ORAL_TABLET | Freq: Four times a day (QID) | ORAL | 0 refills | Status: DC | PRN

## 2016-08-11 MED ORDER — ONDANSETRON HCL 4 MG PO TABS: 4.0000 mg | ORAL_TABLET | Freq: Four times a day (QID) | ORAL | 0 refills | Status: DC | PRN

## 2016-08-11 NOTE — Care Management Obs Status (Signed)
[place] NOTIFICATION   Patient Details  Name: Frances Smith MRN: [id number] Date of Birth: Jan 05, 1946   Medicare Observation Status Notification Given:  Yes    Dellie Catholic, RN 08/11/2016, 10:55 AM

## 2016-08-11 NOTE — Progress Notes (Signed)
   Subjective: 1 Day Post-Op Procedure(s) (LRB): LEFT KNEE UNICOMPARTMENTAL ARTHROPLASTY (Left) Patient reports pain as mild.   Patient seen in rounds by Dr. Wynelle Link. Patient is well, and has had no acute complaints or problems We will start therapy today.  If they do well with therapy and meets all goals, then will allow home later this afternoon following therapy. Plan is to go Home after hospital stay.  Objective: Vital signs in last 24 hours: Temp:  [97.6 F (36.4 C)-98.5 F (36.9 C)] 98.5 F (36.9 C) (09/21 0510) Pulse Rate:  [67-81] 67 (09/21 0510) Resp:  [12-21] 16 (09/21 0510) BP: (113-164)/(66-89) 128/73 (09/21 0510) SpO2:  [97 %-100 %] 100 % (09/21 0510) Weight:  [82.6 kg (182 lb)] 82.6 kg (182 lb) (09/20 0953)  Intake/Output from previous day:  Intake/Output Summary (Last 24 hours) at 08/11/16 0726 Last data filed at 08/11/16 0600  Gross per 24 hour  Intake          2995.75 ml  Output             1322 ml  Net          1673.75 ml    Intake/Output this shift: No intake/output data recorded.  Labs:  Recent Labs  08/11/16 0504  HGB 13.6    Recent Labs  08/11/16 0504  WBC 11.3*  RBC 4.43  HCT 41.3  PLT 199    Recent Labs  08/11/16 0504  NA 140  K 4.1  CL 105  CO2 26  BUN 16  CREATININE 0.69  GLUCOSE 118*  CALCIUM 9.2    Recent Labs  08/10/16 1000  INR 1.00    EXAM General - Patient is Alert, Appropriate and Oriented Extremity - Neurovascular intact Sensation intact distally Dorsiflexion/Plantar flexion intact Dressing - dressing C/D/I Motor Function - intact, moving foot and toes well on exam.  Hemovac pulled without difficulty.  Past Medical History:  Diagnosis Date  . Anxiety   . Arthritis   . Depression   . Dysrhythmia   . GERD (gastroesophageal reflux disease)   . H/O: hysterectomy   . Heart murmur   . History of bronchitis as a child   . History of head injury   . History of measles   . History of mumps   .  Hyperlipidemia   . IBS (irritable bowel syndrome)   . Menopause   . Osteopenia   . Paroxysmal atrial fibrillation (HCC)   . Restless leg syndrome   . Thyroid disease     Assessment/Plan: 1 Day Post-Op Procedure(s) (LRB): LEFT KNEE UNICOMPARTMENTAL ARTHROPLASTY (Left) Principal Problem:   OA (osteoarthritis) of knee  Estimated body mass index is 33.29 kg/m as calculated from the following:   Height as of this encounter: 5\' 2"  (1.575 m).   Weight as of this encounter: 82.6 kg (182 lb). Up with therapy Discharge home with home health  DVT Prophylaxis - Eliquis Weight-Bearing as tolerated to left leg D/C O2 and Pulse OX and try on Room Air  If meets goals and able to go home: Discharge home with home health Diet - Cardiac diet Follow up - in 2 weeks Activity - WBAT Disposition - Home Condition Upon Discharge - Good D/C Meds - See DC Summary DVT Prophylaxis - eliquis  Arlee Muslim, PA-C Orthopaedic Surgery

## 2016-08-11 NOTE — Discharge Instructions (Signed)
° °Dr. Frank Aluisio °Total Joint Specialist °[facility] °[address] °[place], [place] [address] °[phone] ° °UNI KNEE REPLACEMENT POSTOPERATIVE DIRECTIONS ° ° °Knee Rehabilitation, Guidelines Following Surgery  °Results after knee surgery are often greatly improved when you follow the exercise, range of motion and muscle strengthening exercises prescribed by your doctor. Safety measures are also important to protect the knee from further injury. Any time any of these exercises cause you to have increased pain or swelling in your knee joint, decrease the amount until you are comfortable again and slowly increase them. If you have problems or questions, call your caregiver or physical therapist for advice.  ° °HOME CARE INSTRUCTIONS  °Remove items at home which could result in a fall. This includes throw rugs or furniture in walking pathways.  °· ICE to the affected knee every three hours for 30 minutes at a time and then as needed for pain and swelling.  Continue to use ice on the knee for pain and swelling from surgery. You may notice swelling that will progress down to the foot and ankle.  This is normal after surgery.  Elevate the leg when you are not up walking on it.   °· Continue to use the breathing machine which will help keep your temperature down.  It is common for your temperature to cycle up and down following surgery, especially at night when you are not up moving around and exerting yourself.  The breathing machine keeps your lungs expanded and your temperature down. °· Do not place pillow under knee, focus on keeping the knee straight while resting ° °DIET °You may resume your previous home diet once your are discharged from the hospital. ° °DRESSING / WOUND CARE / SHOWERING °You may shower 3 days after surgery, but keep the wounds dry during showering.  You may use an occlusive plastic wrap (Press'n Seal for example), NO SOAKING/SUBMERGING IN THE BATHTUB.  If the  bandage gets wet, change with a clean dry gauze.  If the incision gets wet, pat the wound dry with a clean towel. °You may start showering once you are discharged home but do not submerge the incision under water. Just pat the incision dry and apply a dry gauze dressing on daily. °Change the surgical dressing daily and reapply a dry dressing each time. ° °ACTIVITY °Walk with your walker as instructed. °Use walker as long as suggested by your caregivers. °Avoid periods of inactivity such as sitting longer than an hour when not asleep. This helps prevent blood clots.  °You may resume a sexual relationship in one month or when given the OK by your doctor.  °You may return to work once you are cleared by your doctor.  °Do not drive a car for 6 weeks or until released by you surgeon.  °Do not drive while taking narcotics. ° °WEIGHT BEARING °Weight bearing as tolerated with assist device (walker, cane, etc) as directed, use it as long as suggested by your surgeon or therapist, typically at least 4-6 weeks. ° °POSTOPERATIVE CONSTIPATION PROTOCOL °Constipation - defined medically as fewer than three stools per week and severe constipation as less than one stool per week. ° °One of the most common issues patients have following surgery is constipation.  Even if you have a regular bowel pattern at home, your normal regimen is likely to be disrupted due to multiple reasons following surgery.  Combination of anesthesia, postoperative narcotics, change in appetite and fluid intake all can affect your bowels.    In order to avoid complications following surgery, here are some recommendations in order to help you during your recovery period.  Colace (docusate) - Pick up an over-the-counter form of Colace or another stool softener and take twice a day as long as you are requiring postoperative pain medications.  Take with a full glass of water daily.  If you experience loose stools or diarrhea, hold the colace until you stool forms  back up.  If your symptoms do not get better within 1 week or if they get worse, check with your doctor.  Dulcolax (bisacodyl) - Pick up over-the-counter and take as directed by the product packaging as needed to assist with the movement of your bowels.  Take with a full glass of water.  Use this product as needed if not relieved by Colace only.   MiraLax (polyethylene glycol) - Pick up over-the-counter to have on hand.  MiraLax is a solution that will increase the amount of water in your bowels to assist with bowel movements.  Take as directed and can mix with a glass of water, juice, soda, coffee, or tea.  Take if you go more than two days without a movement. Do not use MiraLax more than once per day. Call your doctor if you are still constipated or irregular after using this medication for 7 days in a row.  If you continue to have problems with postoperative constipation, please contact the office for further assistance and recommendations.  If you experience "the worst abdominal pain ever" or develop nausea or vomiting, please contact the office immediatly for further recommendations for treatment.  ITCHING  If you experience itching with your medications, try taking only a single pain pill, or even half a pain pill at a time.  You can also use Benadryl over the counter for itching or also to help with sleep.   TED HOSE STOCKINGS Wear the elastic stockings on both legs for three weeks following surgery during the day but you may remove then at night for sleeping.  MEDICATIONS See your medication summary on the After Visit Summary that the nursing staff will review with you prior to discharge.  You may have some home medications which will be placed on hold until you complete the course of blood thinner medication.  It is important for you to complete the blood thinner medication as prescribed by your surgeon.  Continue your approved medications as instructed at time of  discharge.  PRECAUTIONS If you experience chest pain or shortness of breath - call 911 immediately for transfer to the hospital emergency department.  If you develop a fever greater that 101 F, purulent drainage from wound, increased redness or drainage from wound, foul odor from the wound/dressing, or calf pain - CONTACT YOUR SURGEON.                                                   FOLLOW-UP APPOINTMENTS Make sure you keep all of your appointments after your operation with your surgeon and caregivers. You should call the office at the above phone number and make an appointment for approximately two weeks after the date of your surgery or on the date instructed by your surgeon outlined in the "After Visit Summary".  RANGE OF MOTION AND STRENGTHENING EXERCISES  Rehabilitation of the knee is important following a knee injury or an  operation. After just a few days of immobilization, the muscles of the thigh which control the knee become weakened and shrink (atrophy). Knee exercises are designed to build up the tone and strength of the thigh muscles and to improve knee motion. Often times heat used for twenty to thirty minutes before working out will loosen up your tissues and help with improving the range of motion but do not use heat for the first two weeks following surgery. These exercises can be done on a training (exercise) mat, on the floor, on a table or on a bed. Use what ever works the best and is most comfortable for you Knee exercises include:  Leg Lifts - While your knee is still immobilized in a splint or cast, you can do straight leg raises. Lift the leg to 60 degrees, hold for 3 sec, and slowly lower the leg. Repeat 10-20 times 2-3 times daily. Perform this exercise against resistance later as your knee gets better.  Quad and Hamstring Sets - Tighten up the muscle on the front of the thigh (Quad) and hold for 5-10 sec. Repeat this 10-20 times hourly. Hamstring sets are done by pushing the  foot backward against an object and holding for 5-10 sec. Repeat as with quad sets.   Leg Slides: Lying on your back, slowly slide your foot toward your buttocks, bending your knee up off the floor (only go as far as is comfortable). Then slowly slide your foot back down until your leg is flat on the floor again.  Angel Wings: Lying on your back spread your legs to the side as far apart as you can without causing discomfort.  A rehabilitation program following serious knee injuries can speed recovery and prevent re-injury in the future due to weakened muscles. Contact your doctor or a physical therapist for more information on knee rehabilitation.   IF YOU ARE TRANSFERRED TO A SKILLED REHAB FACILITY If the patient is transferred to a skilled rehab facility following release from the hospital, a list of the current medications will be sent to the facility for the patient to continue.  When discharged from the skilled rehab facility, please have the facility set up the patient's [place] prior to being released. Also, the skilled facility will be responsible for providing the patient with their medications at time of release from the facility to include their pain medication, the muscle relaxants, and their blood thinner medication. If the patient is still at the rehab facility at time of the two week follow up appointment, the skilled rehab facility will also need to assist the patient in arranging follow up appointment in our office and any transportation needs.  MAKE SURE YOU:  Understand these instructions.  Get help right away if you are not doing well or get worse.    Pick up stool softner and laxative for home use following surgery while on pain medications. Do not submerge incision under water. Please use good hand washing techniques while changing dressing each day. May shower starting three days after surgery. Please use a clean towel to pat the incision dry following  showers. Continue to use ice for pain and swelling after surgery. Do not use any lotions or creams on the incision until instructed by your surgeon.   Resume Eliquis at home.

## 2016-08-11 NOTE — Care Management Note (Signed)
Case Management Note  Patient Details  Name: TEMIA DEBROUX MRN: [id number] Date of Birth: 12/28/1945  Subjective/Objective:                  LEFT KNEE UNICOMPARTMENTAL ARTHROPLASTY (Left) Action/Plan: Discharge planning Expected Discharge Date:  08/11/16               Expected Discharge Plan:  [place]  In-House Referral:     Discharge planning Services  CM Consult  Post Acute Care Choice:  Home Health Choice offered to:  Patient  DME Arranged:  Walker rolling DME Agency:  [place]:  PT [place] Agency:  [facility] ([facility])  Status of Service:  Completed, signed off  If discussed at [employer] of Stay Meetings, dates discussed:    Additional Comments: CM met with pt in room to offer choose home health agency.  Pt chooses [facility] for [place].  Referral given to [facility] rep, Tim.  CM notified [employer] rep, Melissa to please deliver the rolling walker to room so pt can discharge.  No other CM needs were communicated. Dellie Catholic, RN 08/11/2016, 10:56 AM

## 2016-08-11 NOTE — Evaluation (Signed)
Physical Therapy Evaluation Patient Details Name: Frances Smith MRN: [id number] DOB: Apr 05, 1946 Today's Date: 08/11/2016   History of Present Illness  Pt is a 70 y/o female s/p L unicompartmental arthroplasty  Clinical Impression  Pt evaluated by Physical Therapy with no further acute PT needs identified. All education has been completed and the pt has no further questions. See below for any follow-up PT or equipment needs. PT is signing off. Thank you for this referral. Pt was able to perform all mobility including stairs with min guard. Recommended pt use RW for additional stability when d/c home.     Follow Up Recommendations Home health PT;Supervision for mobility/OOB    Equipment Recommendations  Rolling walker with 5" wheels    Recommendations for Other Services       Precautions / Restrictions Precautions Precautions: Knee;Fall Required Braces or Orthoses: Knee Immobilizer - Left Knee Immobilizer - Left: Other (comment) (pt reports no instructions, has not been using) Restrictions Weight Bearing Restrictions: No Other Position/Activity Restrictions: WBAT      Mobility  Bed Mobility Overal bed mobility: Needs Assistance Bed Mobility: Supine to Sit;Sit to Supine     Supine to sit: Min guard Sit to supine: Min guard   General bed mobility comments: guarding for safety  Transfers Overall transfer level: Needs assistance Equipment used: Rolling walker (2 wheeled) Transfers: Sit to/from Stand Sit to Stand: Min guard         General transfer comment: guarding for safety; verbal cues for L LE positioning upon rise and controllled descent  Ambulation/Gait Ambulation/Gait assistance: Min guard Ambulation Distance (Feet): 100 Feet Assistive device: Rolling walker (2 wheeled) Gait Pattern/deviations: Step-through pattern;Decreased stance time - left;Antalgic     General Gait Details: guarding for safety; verbal cues for RW positioning, heel strike, and knee  flexion  Stairs Stairs: Yes Stairs assistance: Min guard Stair Management: One rail Left;With crutches;Forwards Number of Stairs: 3 General stair comments: pt able to ascend/descend 3 shorter steps using safe stair technique with one crutch and the L handrail; guarding for safety; verbal cues for crutch placement and LE sequencing; spouse observed safe stair technique  Wheelchair Mobility    Modified Rankin (Stroke Patients Only)       Balance                                             Pertinent Vitals/Pain Pain Assessment: 0-10 Pain Score: 4  Pain Location: L knee Pain Descriptors / Indicators: Aching;Sore Pain Intervention(s): Limited activity within patient's tolerance;Monitored during session;Repositioned;Ice applied;Premedicated before session    [place] expects to be discharged to:: Private residence Living Arrangements: Spouse/significant other Available Help at Discharge: Family;Available PRN/intermittently Type of Home: House Home Access: Stairs to enter Entrance Stairs-Rails: Left;Right (cannot reach both; will be using L stair rail) Entrance Stairs-Number of Steps: 2 Home Layout: One level Home Equipment: Walker - 4 wheels;Crutches      Prior Function Level of Independence: Independent         Comments: use of rollator as needed for pain     Hand Dominance        Extremity/Trunk Assessment               Lower Extremity Assessment: LLE deficits/detail   LLE Deficits / Details: pt able to perform SLR and achieve approx 60 degrees of knee flexion  Communication   Communication: No difficulties  Cognition Arousal/Alertness: Awake/alert Behavior During Therapy: WFL for tasks assessed/performed Overall Cognitive Status: Within Functional Limits for tasks assessed                      General Comments      Exercises Total Joint Exercises Ankle Circles/Pumps: 20 reps;AROM;Both Quad  Sets: AROM;10 reps;Left Short Arc Quad: AROM;10 reps;Left Heel Slides: AAROM;Left;10 reps (self-assist with sheet) Hip ABduction/ADduction: AROM;10 reps;Left Straight Leg Raises: 5 reps;AROM;Left   Assessment/Plan    PT Assessment All further PT needs can be met in the next venue of care  PT Problem List Decreased strength;Decreased range of motion;Decreased mobility          PT Treatment Interventions      PT Goals (Current goals can be found in the Care Plan section)  Acute Rehab PT Goals PT Goal Formulation: All assessment and education complete, DC therapy    Frequency     Barriers to discharge        Co-evaluation               End of Session Equipment Utilized During Treatment: Gait belt Activity Tolerance: Patient tolerated treatment well Patient left: in bed;with call bell/phone within reach;with family/visitor present      Functional Assessment Tool Used: clinical judgement Functional Limitation: Mobility: Walking and moving around Mobility: Walking and Moving Around Current Status (H2197): At least 1 percent but less than 20 percent impaired, limited or restricted Mobility: Walking and Moving Around Goal Status [phone]): At least 1 percent but less than 20 percent impaired, limited or restricted Mobility: Walking and Moving Around Discharge Status [phone]): At least 1 percent but less than 20 percent impaired, limited or restricted    Time: 0916-0953 PT Time Calculation (min) (ACUTE ONLY): 37 min   Charges:   PT Evaluation $PT Eval Low Complexity: 1 Procedure PT Treatments $Gait Training: 8-22 mins   PT G Codes:   PT G-Codes **NOT FOR INPATIENT CLASS** Functional Assessment Tool Used: clinical judgement Functional Limitation: Mobility: Walking and moving around Mobility: Walking and Moving Around Current Status (M4158): At least 1 percent but less than 20 percent impaired, limited or restricted Mobility: Walking and Moving Around Goal Status  [phone]): At least 1 percent but less than 20 percent impaired, limited or restricted Mobility: Walking and Moving Around Discharge Status [phone]): At least 1 percent but less than 20 percent impaired, limited or restricted    Dewitt Hoes 08/11/2016, 1:06 PM Dewitt Hoes, SPT

## 2016-08-11 NOTE — Progress Notes (Signed)
OT Cancellation Note  Patient Details Name: Frances Smith MRN: [id number] DOB: [birth year]   Cancelled Treatment:    Reason Eval/Treat Not Completed: Other (comment).  Pt too tired to practice bathroom transfers:  She will be discharging this morning.  She will try her standard commode at home with sink and walker.  Educated on shower sequence.  No further OT needs at this time  [facility] 08/11/2016, 10:33 AM  Lesle Chris, OTR/L [phone] 08/11/2016

## 2016-08-11 NOTE — Discharge Summary (Signed)
Physician Discharge Summary   Patient ID: Frances Smith MRN: [id number] DOB/AGE: [birth year] 72 y.o.  Admit date: 08/10/2016 Discharge date: 08/11/2016  Primary Diagnosis:  Medial compartment osteoarthritis, Left knee  Admission Diagnoses:  Past Medical History:  Diagnosis Date  . Anxiety   . Arthritis   . Depression   . Dysrhythmia   . GERD (gastroesophageal reflux disease)   . H/O: hysterectomy   . Heart murmur   . History of bronchitis as a child   . History of head injury   . History of measles   . History of mumps   . Hyperlipidemia   . IBS (irritable bowel syndrome)   . Menopause   . Osteopenia   . Paroxysmal atrial fibrillation (HCC)   . Restless leg syndrome   . Thyroid disease    Discharge Diagnoses:   Principal Problem:   OA (osteoarthritis) of knee  Estimated body mass index is 33.29 kg/m as calculated from the following:   Height as of this encounter: 5' 2"  (1.575 m).   Weight as of this encounter: 82.6 kg (182 lb).  Procedure:  Procedure(s) (LRB): LEFT KNEE UNICOMPARTMENTAL ARTHROPLASTY (Left)   Consults: None  HPI: Frances Smith is a 70 y.o. female, who has  significant isolated medial compartment arthritis of the Left knee. The patient has had nonoperative management including injections of cortisone and viscous supplements. Unfortunately, the pain persists.  Radiograph showed isolated medial compartment bone-on-bone arthritis  with normal-appearing patellofemoral and lateral compartments. The patient presents now for left knee unicompartmental arthroplasty.   Laboratory Data: Admission on 08/10/2016  Component Date Value Ref Range Status  . aPTT 08/10/2016 28  24 - 36 seconds Final  . Prothrombin Time 08/10/2016 13.2  11.4 - 15.2 seconds Final  . INR 08/10/2016 1.00   Final  . WBC 08/11/2016 11.3* 4.0 - 10.5 K/uL Final  . RBC 08/11/2016 4.43  3.87 - 5.11 MIL/uL Final  . Hemoglobin 08/11/2016 13.6  12.0 - 15.0 g/dL Final  . HCT 08/11/2016  41.3  36.0 - 46.0 % Final  . MCV 08/11/2016 93.2  78.0 - 100.0 fL Final  . MCH 08/11/2016 30.7  26.0 - 34.0 pg Final  . MCHC 08/11/2016 32.9  30.0 - 36.0 g/dL Final  . RDW 08/11/2016 13.3  11.5 - 15.5 % Final  . Platelets 08/11/2016 199  150 - 400 K/uL Final  . Sodium 08/11/2016 140  135 - 145 mmol/L Final  . Potassium 08/11/2016 4.1  3.5 - 5.1 mmol/L Final  . Chloride 08/11/2016 105  101 - 111 mmol/L Final  . CO2 08/11/2016 26  22 - 32 mmol/L Final  . Glucose, Bld 08/11/2016 118* 65 - 99 mg/dL Final  . BUN 08/11/2016 16  6 - 20 mg/dL Final  . Creatinine, Ser 08/11/2016 0.69  0.44 - 1.00 mg/dL Final  . Calcium 08/11/2016 9.2  8.9 - 10.3 mg/dL Final  . GFR calc non Af Amer 08/11/2016 >60  >60 mL/min Final  . GFR calc Af Amer 08/11/2016 >60  >60 mL/min Final   Comment: (NOTE) The eGFR has been calculated using the CKD EPI equation. This calculation has not been validated in all clinical situations. eGFR's persistently <60 mL/min signify possible Chronic Kidney Disease.   Georgiann Hahn gap 08/11/2016 9  5 - 15 Final  Hospital Outpatient Visit on 08/03/2016  Component Date Value Ref Range Status  . ABO/RH(D) 08/10/2016 O POS   Final  . Antibody Screen 08/10/2016 NEG   Final  .  Sample Expiration 08/10/2016 08/13/2016   Final  . Extend sample reason 08/10/2016 NO TRANSFUSIONS OR PREGNANCY IN THE PAST 3 MONTHS   Final  . Color, Urine 08/03/2016 AMBER* YELLOW Final  . APPearance 08/03/2016 CLEAR  CLEAR Final  . Specific Gravity, Urine 08/03/2016 1.041* 1.005 - 1.030 Final  . pH 08/03/2016 5.0  5.0 - 8.0 Final  . Glucose, UA 08/03/2016 NEGATIVE  NEGATIVE mg/dL Final  . Hgb urine dipstick 08/03/2016 NEGATIVE  NEGATIVE Final  . Bilirubin Urine 08/03/2016 NEGATIVE  NEGATIVE Final  . Ketones, ur 08/03/2016 NEGATIVE  NEGATIVE mg/dL Final  . Protein, ur 08/03/2016 NEGATIVE  NEGATIVE mg/dL Final  . Nitrite 08/03/2016 NEGATIVE  NEGATIVE Final  . Leukocytes, UA 08/03/2016 NEGATIVE  NEGATIVE Final  .  MRSA, PCR 08/03/2016 NEGATIVE  NEGATIVE Final  . Staphylococcus aureus 08/03/2016 NEGATIVE  NEGATIVE Final   Comment:        The Xpert SA Assay (FDA approved for NASAL specimens in patients over 1 years of age), is one component of a comprehensive surveillance program.  Test performance has been validated by [facility] for patients greater than or equal to 28 year old. It is not intended to diagnose infection nor to guide or monitor treatment.   . ABO/RH(D) 08/04/2016 O POS   Final  Appointment on 07/28/2016  Component Date Value Ref Range Status  . Rest HR 07/28/2016 68  bpm Final  . Rest BP 07/28/2016 136/73  mmHg Final  . Peak HR 07/28/2016 100  bpm Final  . Peak BP 07/28/2016 142/89  mmHg Final  . SSS 07/28/2016 2   Final  . SRS 07/28/2016 2   Final  . SDS 07/28/2016 0   Final  . LHR 07/28/2016 0.25   Final  . TID 07/28/2016 1.03   Final  . LV sys vol 07/28/2016 21  mL Final  . LV dias vol 07/28/2016 73  46 - 106 mL Final     X-Rays:No results found.  EKG: Orders placed or performed in visit on 07/26/16  . EKG 12-Lead     Hospital Course: Frances Smith is a 70 y.o. who was admitted to [facility]. They were brought to the operating room on 08/10/2016 and underwent Procedure(s): LEFT KNEE UNICOMPARTMENTAL ARTHROPLASTY.  Patient tolerated the procedure well and was later transferred to the recovery room and then to the orthopaedic floor for postoperative care.  They were given PO and IV analgesics for pain control following their surgery.  They were given 24 hours of postoperative antibiotics of  Anti-infectives    Start     Dose/Rate Route Frequency Ordered Stop   08/10/16 1800  ceFAZolin (ANCEF) IVPB 2g/100 mL premix     2 g 200 mL/hr over 30 Minutes Intravenous Every 6 hours 08/10/16 1558 08/11/16 0048   08/10/16 0927  ceFAZolin (ANCEF) IVPB 2g/100 mL premix     2 g 200 mL/hr over 30 Minutes Intravenous On call to O.R. 08/10/16 [id number] 08/10/16 1157      and started on DVT prophylaxis in the form of Eliquis.   PT and OT were ordered for postop therapy protocol.  Discharge planning consulted to help with postop disposition and equipment needs.  Patient had a good night on the evening of surgery.  They started to get up OOB with therapy on day one. Hemovac drain was pulled without difficulty.  Patient was seen in rounds on day one and it was felt that as long as they did well  with the remaining sessions of therapy that they would be ready to go home.  Arrangements were made and they were setup to go home on POD 1.  Diet - Cardiac diet Follow up - in 2 weeks Activity - WBAT Dressing - May remove the surgical dressing tomorrow at home and then apply a dry gauze dressing daily. May shower three days following surgery but do not submerge the incision under water. Disposition - Home Condition Upon Discharge - Good D/C Meds - See DC Summary DVT Prophylaxis - Resume the Eliquis at home.  Discharge Instructions    Call MD / Call 911    Complete by:  As directed    If you experience chest pain or shortness of breath, CALL 911 and be transported to the hospital emergency room.  If you develope a fever above 101 F, pus (white drainage) or increased drainage or redness at the wound, or calf pain, call your surgeon's office.   Change dressing    Complete by:  As directed    Change dressing daily with sterile 4 x 4 inch gauze dressing and apply TED hose. Do not submerge the incision under water.   Constipation Prevention    Complete by:  As directed    Drink plenty of fluids.  Prune juice may be helpful.  You may use a stool softener, such as Colace (over the counter) 100 mg twice a day.  Use MiraLax (over the counter) for constipation as needed.   Diet - low sodium heart healthy    Complete by:  As directed    Discharge instructions    Complete by:  As directed    Pick up stool softner and laxative for home use following surgery while on pain  medications. Do not submerge incision under water. Please use good hand washing techniques while changing dressing each day. May shower starting three days after surgery. Please use a clean towel to pat the incision dry following showers. Continue to use ice for pain and swelling after surgery. Do not use any lotions or creams on the incision until instructed by your surgeon.   Postoperative Constipation Protocol  Constipation - defined medically as fewer than three stools per week and severe constipation as less than one stool per week.  One of the most common issues patients have following surgery is constipation.  Even if you have a regular bowel pattern at home, your normal regimen is likely to be disrupted due to multiple reasons following surgery.  Combination of anesthesia, postoperative narcotics, change in appetite and fluid intake all can affect your bowels.  In order to avoid complications following surgery, here are some recommendations in order to help you during your recovery period.  Colace (docusate) - Pick up an over-the-counter form of Colace or another stool softener and take twice a day as long as you are requiring postoperative pain medications.  Take with a full glass of water daily.  If you experience loose stools or diarrhea, hold the colace until you stool forms back up.  If your symptoms do not get better within 1 week or if they get worse, check with your doctor.  Dulcolax (bisacodyl) - Pick up over-the-counter and take as directed by the product packaging as needed to assist with the movement of your bowels.  Take with a full glass of water.  Use this product as needed if not relieved by Colace only.   MiraLax (polyethylene glycol) - Pick up over-the-counter to have on hand.  MiraLax is a solution that will increase the amount of water in your bowels to assist with bowel movements.  Take as directed and can mix with a glass of water, juice, soda, coffee, or tea.  Take  if you go more than two days without a movement. Do not use MiraLax more than once per day. Call your doctor if you are still constipated or irregular after using this medication for 7 days in a row.  If you continue to have problems with postoperative constipation, please contact the office for further assistance and recommendations.  If you experience "the worst abdominal pain ever" or develop nausea or vomiting, please contact the office immediatly for further recommendations for treatment.   Resume Eliquis at home.   Do not put a pillow under the knee. Place it under the heel.    Complete by:  As directed    Do not sit on low chairs, stoools or toilet seats, as it may be difficult to get up from low surfaces    Complete by:  As directed    Driving restrictions    Complete by:  As directed    No driving until released by the physician.   Increase activity slowly as tolerated    Complete by:  As directed    Lifting restrictions    Complete by:  As directed    No lifting until released by the physician.   Patient may shower    Complete by:  As directed    You may shower without a dressing once there is no drainage.  Do not wash over the wound.  If drainage remains, do not shower until drainage stops.   TED hose    Complete by:  As directed    Use stockings (TED hose) for 3 weeks on both leg(s).  You may remove them at night for sleeping.   Weight bearing as tolerated    Complete by:  As directed    Laterality:  left   Extremity:  Lower       Medication List    STOP taking these medications   co-enzyme Q-10 50 MG capsule   D3-1000 1000 units capsule Generic drug:  Cholecalciferol   multivitamin with minerals Tabs tablet   vitamin C 1000 MG tablet     TAKE these medications   acetaminophen 500 MG tablet Commonly known as:  TYLENOL Take 500 mg by mouth 3 (three) times daily as needed (for pain.).   apixaban 5 MG Tabs tablet Commonly known as:  ELIQUIS Take 1 tablet  (5 mg total) by mouth 2 (two) times daily.   celecoxib 200 MG capsule Commonly known as:  CELEBREX Take 200 mg by mouth daily.   doxylamine (Sleep) 25 MG tablet Commonly known as:  UNISOM Take 25 mg by mouth at bedtime as needed for sleep.   DULoxetine 30 MG capsule Commonly known as:  CYMBALTA Take 60 mg by mouth at bedtime.   HYDROmorphone 2 MG tablet Commonly known as:  DILAUDID Take 1-2 tablets (2-4 mg total) by mouth every 4 (four) hours as needed for severe pain.   methocarbamol 500 MG tablet Commonly known as:  ROBAXIN Take 1 tablet (500 mg total) by mouth every 6 (six) hours as needed for muscle spasms.   NEXIUM 24HR PO Take 20 mg by mouth daily as needed (for acid reflux.).   ondansetron 4 MG tablet Commonly known as:  ZOFRAN Take 1 tablet (4 mg total) by mouth every 6 (six)  hours as needed for nausea.   pravastatin 40 MG tablet Commonly known as:  PRAVACHOL Take 40 mg by mouth daily.   propafenone 325 MG 12 hr capsule Commonly known as:  RYTHMOL SR TAKE 1 CAPSULE EVERY 12 HOURS   ranitidine 150 MG tablet Commonly known as:  ZANTAC Take 150 mg by mouth 2 (two) times daily as needed (for acid reflux.).   rOPINIRole 1 MG tablet Commonly known as:  REQUIP Take 1 mg by mouth 2 (two) times daily.   thyroid 30 MG tablet Commonly known as:  ARMOUR Take 30 mg by mouth daily before breakfast.      Follow-up Information    Gearlean Alf, MD. Schedule an appointment as soon as possible for a visit on 08/23/2016.   Specialty:  Orthopedic Surgery Contact information: [address] [place] [address] [phone]           Signed: Arlee Muslim, PA-C Orthopaedic Surgery 08/11/2016, 7:34 AM

## 2016-08-31 DIAGNOSIS — Z96651 Presence of right artificial knee joint: Secondary | ICD-10-CM | POA: Insufficient documentation

## 2016-09-04 NOTE — Progress Notes (Signed)
Cardiology Office Note Date:  09/05/2016  Patient ID:  Frances, Smith 30-Dec-1945, MRN [id number] PCP:  Chesley Noon, MD  Cardiologist:  Dr. Lovena Le    Chief Complaint: needs clearance to return to PT  History of Present Illness: Frances Smith is a 70 y.o. female with history of PAFlutter/ablated, PAfib, HLD, hypothyroidism, comes to the office today to be seen for Dr. Lovena Le.  She was last seen by him Dec 2016, at that time doing well, on Porpafenone for AAD tx and Eliquis for a/c, her most recent visit was by myself in conjunction with Dr. Lovena Le for the purpose of preoperative evaluation for knee surgery.  She underwent surgery 08/10/16.  She comes in today reporting an episode of AFib lasting about 4 hours in duration, though since she mentioned it to the therapist and was "out of rhythym" at the time, they cancelled her appointment for that day of theray and was told she would need clearance from her cardiologist to resume.  She had no symptoms with the episode other then the irregularity of her pulse, no CP or SOB, no dizziness, near syncope or syncope.  She mentions that via her PMD her Cymbalta was changed to Zoloft and is feeling really well with this change as well.  She thinks the episode may have been provoked secondary to her pain meds and perioperatively some of her medicines had gotten off schedule.      She denies bleeding or signs of bleeding before or since her surgery,  Back on her Eliquis.  She mentions on 2 occassions in the last year or so a LLE rash mid-shin and distal, sporadic, not puritic or painful, lasted 3 days, self resolved, saw a dermatologist who thought possible superficial vascular, made no recommendations to stop Eliquis or any of her medicines.  She does not have any significant or notable bruising at all on her arms/legs, no bleeding or signs of bleeding that she reports.  H/H and plts look good on labs dates 07/13/16.  She reports her lipids/labs are  monitored by her PMD.   Past Medical History:  Diagnosis Date  . Anxiety   . Arthritis   . Depression   . Dysrhythmia   . GERD (gastroesophageal reflux disease)   . H/O: hysterectomy   . Heart murmur   . History of bronchitis as a child   . History of head injury   . History of measles   . History of mumps   . Hyperlipidemia   . IBS (irritable bowel syndrome)   . Menopause   . Osteopenia   . Paroxysmal atrial fibrillation (HCC)   . Restless leg syndrome   . Thyroid disease     Past Surgical History:  Procedure Laterality Date  . ADENOIDECTOMY  1949  . BREAST SURGERY     aspiration of cyst / left   . COLONOSCOPY  2010  . ENDOMETRIAL ABLATION    . INCISION AND DRAINAGE ABSCESS ANAL Left 1985   breast  . PARTIAL KNEE ARTHROPLASTY Left 08/10/2016   Procedure: LEFT KNEE UNICOMPARTMENTAL ARTHROPLASTY;  Surgeon: Gaynelle Arabian, MD;  Location: [facility];  Service: Orthopedics;  Laterality: Left;  . TONSILLECTOMY  1949  . TREATMENT FISTULA ANAL  1988  . VAGINAL HYSTERECTOMY  1984    Current Outpatient Prescriptions  Medication Sig Dispense Refill  . acetaminophen (TYLENOL) 500 MG tablet Take 500 mg by mouth 3 (three) times daily as needed (for pain.).    Marland Kitchen apixaban (ELIQUIS)  5 MG TABS tablet Take 1 tablet (5 mg total) by mouth 2 (two) times daily. 180 tablet 1  . celecoxib (CELEBREX) 200 MG capsule Take 200 mg by mouth daily.    Marland Kitchen doxylamine, Sleep, (UNISOM) 25 MG tablet Take 25 mg by mouth at bedtime as needed for sleep.    . pravastatin (PRAVACHOL) 40 MG tablet Take 40 mg by mouth daily.      . propafenone (RYTHMOL SR) 325 MG 12 hr capsule TAKE 1 CAPSULE EVERY 12 HOURS 180 capsule 2  . ranitidine (ZANTAC) 150 MG tablet Take 150 mg by mouth 2 (two) times daily as needed (for acid reflux.).     Marland Kitchen rOPINIRole (REQUIP) 1 MG tablet Take 1 mg by mouth 2 (two) times daily.     Marland Kitchen thyroid (ARMOUR) 30 MG tablet Take 30 mg by mouth daily before breakfast.     No current  facility-administered medications for this visit.     Allergies:   Codeine   Social History:  The patient  reports that she quit smoking about 51 years ago. Her smoking use included Cigarettes. She has a 2.50 pack-year smoking history. She has never used smokeless tobacco. She reports that she does not drink alcohol or use drugs.   Family History:  The patient's family history includes Allergies in her mother; Anxiety disorder in her brother, daughter, and son; Asthma in her mother; Breast cancer in her mother; Colon cancer (age of onset: 54) in her cousin; Colon cancer (age of onset: 2) in her maternal aunt; Depression in her daughter and son; Heart failure (age of onset: 87) in her father; Thyroid disease in her brother.  ROS:  Please see the history of present illness.   All other systems are reviewed and otherwise negative.   PHYSICAL EXAM:  VS:  BP 122/80   Pulse 80   Ht 5\' 2"  (1.575 m)   Wt 178 lb (80.7 kg)   BMI 32.56 kg/m  BMI: Body mass index is 32.56 kg/m. Well nourished, well developed, in no acute distress  HEENT: normocephalic, atraumatic  Neck: no JVD, carotid bruits or masses Cardiac:  RRR, no significant murmurs, no rubs, or gallops Lungs:  clear to auscultation bilaterally, no wheezing, rhonchi or rales  Abd: soft, nontender MS: no deformity or atrophy Ext: no edema  Skin: warm and dry, no rash Neuro:  No gross deficits appreciated Psych: euthymic mood, full affect    EKG:  Done today and reviewed by myself is SR, QRS 35ms  07/28/16: Frances Smith Study Highlights   Nuclear stress EF: 71%.  There was no ST segment deviation noted during stress.  The study is normal.  This is a low risk study.  The left ventricular ejection fraction is hyperdynamic (>65%).       09/21/11: stress myoview Overall Impression:  Normal stress nuclear study EF: 77% NL LV Function; NL Wall Motion   Recent Labs: 08/11/2016: BUN 16; Creatinine, Ser 0.69; Hemoglobin  13.6; Platelets 199; Potassium 4.1; Sodium 140  No results found for requested labs within last 8760 hours.   CrCl cannot be calculated (Patient's most recent lab result is older than the maximum 21 days allowed.).   Wt Readings from Last 3 Encounters:  09/05/16 178 lb (80.7 kg)  08/10/16 182 lb (82.6 kg)  08/03/16 182 lb (82.6 kg)     Other studies reviewed: Additional studies/records reviewed today include: summarized above  ASSESSMENT AND PLAN:   1. PAfib     On  Rhythmal, stable QRS duration     CHA2DS2Vasc is at least 2, on Eliquis  Reviewed with Dr. Curt Bears, no objection to her resuming PT         Disposition:f/u with Dr. Lovena Le in 56months, sooner if needed   Current medicines are reviewed at length with the patient today.  The patient did not have any concerns regarding medicines. Dictation #1 [id number]  [id number]  Haywood Lasso, PA-C 09/05/2016 10:22 AM     [facility] [address] [place] [address] [phone] (office)  [phone] (fax)

## 2016-09-05 ENCOUNTER — Ambulatory Visit (INDEPENDENT_AMBULATORY_CARE_PROVIDER_SITE_OTHER): Payer: Medicare Other | Admitting: Physician Assistant

## 2016-09-05 ENCOUNTER — Encounter: Payer: Self-pay | Admitting: Physician Assistant

## 2016-09-05 VITALS — BP 122/80 | HR 80 | Ht 62.0 in | Wt 178.0 lb

## 2016-09-05 DIAGNOSIS — I480 Paroxysmal atrial fibrillation: Secondary | ICD-10-CM | POA: Diagnosis not present

## 2016-09-05 NOTE — Patient Instructions (Signed)
Medication Instructions:   Your physician recommends that you continue on your current medications as directed. Please refer to the Current Medication list given to you today.   If you need a refill on your cardiac medications before your next appointment, please call your pharmacy.  Labwork: NONE ORDER TODAY    Testing/Procedures:NONE ORDER TODAY    Follow-Up: Your physician wants you to follow-up in:  IN  6  MONTHS WITH DR TAYLOR  You will receive a reminder letter in the mail two months in advance. If you don't receive a letter, please call our office to schedule the follow-up appointment.      Any Other Special Instructions Will Be Listed Below (If Applicable).                                                                                                                                                   

## 2016-10-03 ENCOUNTER — Other Ambulatory Visit: Payer: Self-pay | Admitting: Obstetrics and Gynecology

## 2016-10-03 DIAGNOSIS — Z1231 Encounter for screening mammogram for malignant neoplasm of breast: Secondary | ICD-10-CM

## 2016-10-09 ENCOUNTER — Other Ambulatory Visit: Payer: Self-pay | Admitting: Internal Medicine

## 2016-11-08 ENCOUNTER — Ambulatory Visit
Admission: RE | Admit: 2016-11-08 | Discharge: 2016-11-08 | Disposition: A | Discharge status: Home / Self Care | Payer: Medicare Other | Source: Ambulatory Visit | Attending: Obstetrics and Gynecology | Admitting: Obstetrics and Gynecology

## 2016-11-08 DIAGNOSIS — Z1231 Encounter for screening mammogram for malignant neoplasm of breast: Secondary | ICD-10-CM

## 2016-12-01 DIAGNOSIS — Z6834 Body mass index (BMI) 34.0-34.9, adult: Secondary | ICD-10-CM | POA: Insufficient documentation

## 2016-12-01 DIAGNOSIS — E669 Obesity, unspecified: Secondary | ICD-10-CM | POA: Insufficient documentation

## 2016-12-26 ENCOUNTER — Other Ambulatory Visit: Payer: Self-pay | Admitting: Internal Medicine

## 2017-02-26 NOTE — Progress Notes (Signed)
Cardiology Office Note Date:  02/28/2017  Patient ID:  Frances, Smith [birth year], MRN [id number] PCP:  Chesley Noon, MD  Cardiologist:  Dr. Lovena Le    Chief Complaint: planned 6 mo visit  History of Present Illness: Frances Smith is a 71 y.o. female with history of PAFlutter/ablated, PAfib, HLD, hypothyroidism, comes to the office today to be seen for Dr. Lovena Le.  She was last seen by him Dec 2016, at that time doing well, on Porpafenone for AAD tx and Eliquis for a/c,    She comes in today to be seen for Dr. Lovena Le, last seen by myself in October for clearance back to PT s/p knee surgery.  She is feeling very well.  In fact, states that with her PMD and the weight loss clinic there she has been started on Contrave and has lost 12 pounds, feels like she has increased energy, is exercising more and looking forward to a trip to [place] his summer.  She denies any CP or SOB, no exertional incapacities, again, feels like she can and is doing more.  She has infrequent palpitations, a couple weeks prior to the Contrave had a day with about 2 hours of palpitations, this occurs infrequently and not associated with any other symptoms.  No dizziness, near syncope or syncope.  No bleeding or signs of bleeding.   She reports her lipids/labs are monitored by her PMD, she brings copy of her last lipid profile, HDL was 80, LDL was 70.   Past Medical History:  Diagnosis Date  . Anxiety   . Arthritis   . Depression   . Dysrhythmia   . GERD (gastroesophageal reflux disease)   . H/O: hysterectomy   . Heart murmur   . History of bronchitis as a child   . History of head injury   . History of measles   . History of mumps   . Hyperlipidemia   . IBS (irritable bowel syndrome)   . Menopause   . Osteopenia   . Paroxysmal atrial fibrillation (HCC)   . Restless leg syndrome   . Thyroid disease     Past Surgical History:  Procedure Laterality Date  . ADENOIDECTOMY  1949  . BREAST SURGERY       aspiration of cyst / left   . COLONOSCOPY  2010  . ENDOMETRIAL ABLATION    . INCISION AND DRAINAGE ABSCESS ANAL Left 1985   breast  . PARTIAL KNEE ARTHROPLASTY Left 08/10/2016   Procedure: LEFT KNEE UNICOMPARTMENTAL ARTHROPLASTY;  Surgeon: Gaynelle Arabian, MD;  Location: [facility];  Service: Orthopedics;  Laterality: Left;  . TONSILLECTOMY  1949  . TREATMENT FISTULA ANAL  1988  . VAGINAL HYSTERECTOMY  1984    Current Outpatient Prescriptions  Medication Sig Dispense Refill  . acetaminophen (TYLENOL) 500 MG tablet Take 500 mg by mouth 3 (three) times daily as needed (for pain.).    Marland Kitchen apixaban (ELIQUIS) 5 MG TABS tablet Take 1 tablet (5 mg total) by mouth 2 (two) times daily. 180 tablet 2  . Ascorbic Acid (VITAMIN C) 1000 MG tablet Take 1,000 mg by mouth daily.    . celecoxib (CELEBREX) 200 MG capsule Take 200 mg by mouth daily.    . Coenzyme Q10 (CO Q 10) 100 MG CAPS Take by mouth.    . Melatonin 5 MG TABS Take by mouth at bedtime.    . Multiple Vitamins-Minerals (MULTIVITAMIN ADULT PO) Take by mouth daily.    . Naltrexone-Bupropion HCl (CONTRAVE PO) Take  by mouth 2 (two) times daily. Take take 2 tablets twice a day    . pravastatin (PRAVACHOL) 40 MG tablet Take 40 mg by mouth daily.      . propafenone (RYTHMOL SR) 325 MG 12 hr capsule Take 1 capsule (325 mg total) by mouth every 12 (twelve) hours. 180 capsule 2  . ranitidine (ZANTAC) 150 MG tablet Take 150 mg by mouth 2 (two) times daily as needed (for acid reflux.).     Marland Kitchen rOPINIRole (REQUIP) 1 MG tablet Take 1 mg by mouth 2 (two) times daily.     . sertraline (ZOLOFT) 100 MG tablet Take 100 mg by mouth daily.     Marland Kitchen thyroid (ARMOUR) 30 MG tablet Take 30 mg by mouth daily before breakfast.     No current facility-administered medications for this visit.     Allergies:   Codeine   Social History:  The patient  reports that she quit smoking about 52 years ago. Her smoking use included Cigarettes. She has a 2.50 pack-year smoking history.  She has never used smokeless tobacco. She reports that she does not drink alcohol or use drugs.   Family History:  The patient's family history includes Allergies in her mother; Anxiety disorder in her brother, daughter, and son; Asthma in her mother; Breast cancer in her mother; Colon cancer (age of onset: 38) in her cousin; Colon cancer (age of onset: 23) in her maternal aunt; Depression in her daughter and son; Heart failure (age of onset: 11) in her father; Thyroid disease in her brother.  ROS:  Please see the history of present illness.   All other systems are reviewed and otherwise negative.   PHYSICAL EXAM:  VS:  BP 126/74   Pulse 66   Ht 5\' 2"  (1.575 m)   Wt 177 lb (80.3 kg)   BMI 32.37 kg/m  BMI: Body mass index is 32.37 kg/m. Well nourished, well developed, in no acute distress  HEENT: normocephalic, atraumatic  Neck: no JVD, carotid bruits or masses Cardiac:  RRR, no significant murmurs, no rubs, or gallops Lungs:  CTA b/l, no wheezing, rhonchi or rales  Abd: soft, nontender MS: no deformity or atrophy Ext: no edema  Skin: warm and dry, no rash Neuro:  No gross deficits appreciated Psych: euthymic mood, full affect    EKG:  Done today and reviewed by myself: SR, 62bpm, PR 171ms, QRS 31ms, QTc 448ms  07/28/16: [facility] Call Study Highlights   Nuclear stress EF: 71%.  There was no ST segment deviation noted during stress.  The study is normal.  This is a low risk study.  The left ventricular ejection fraction is hyperdynamic (>65%).       09/21/11: stress myoview Overall Impression:  Normal stress nuclear study EF: 77% NL LV Function; NL Wall Motion   Recent Labs: 08/11/2016: BUN 16; Creatinine, Ser 0.69; Hemoglobin 13.6; Platelets 199; Potassium 4.1; Sodium 140  No results found for requested labs within last 8760 hours.   CrCl cannot be calculated (Patient's most recent lab result is older than the maximum 21 days allowed.).   Wt Readings from Last  3 Encounters:  02/28/17 177 lb (80.3 kg)  09/05/16 178 lb (80.7 kg)  08/10/16 182 lb (82.6 kg)     Other studies reviewed: Additional studies/records reviewed today include: summarized above  ASSESSMENT AND PLAN:   1. PAfib     On Rhythmal, stable QRS duration     CHA2DS2Vasc is at least 2, on Eliquis  Reviewed Contrave with RPH today, OK The patient is counseled to give our pharmacist a call when being started on a new medicine to make sure is OK with her rhythmol   Disposition:  Will get BMET, CBC, mag level today given her meds, will see her back in 6 months, sooner if needed.  Current medicines are reviewed at length with the patient today.  The patient did not have any concerns regarding medicines.  Haywood Lasso, PA-C 02/28/2017 10:30 AM     [facility] [place] [address] [phone] (office)  [phone] (fax)

## 2017-02-28 ENCOUNTER — Ambulatory Visit (INDEPENDENT_AMBULATORY_CARE_PROVIDER_SITE_OTHER): Payer: Medicare Other | Admitting: Physician Assistant

## 2017-02-28 VITALS — BP 126/74 | HR 66 | Ht 62.0 in | Wt 177.0 lb

## 2017-02-28 DIAGNOSIS — I480 Paroxysmal atrial fibrillation: Secondary | ICD-10-CM | POA: Diagnosis not present

## 2017-02-28 DIAGNOSIS — Z79899 Other long term (current) drug therapy: Secondary | ICD-10-CM | POA: Diagnosis not present

## 2017-02-28 LAB — CBC
HEMATOCRIT: 42.9 % (ref 34.0–46.6)
Hemoglobin: 14.1 g/dL (ref 11.1–15.9)
MCH: 29.4 pg (ref 26.6–33.0)
MCHC: 32.9 g/dL (ref 31.5–35.7)
MCV: 89 fL (ref 79–97)
Platelets: 196 10*3/uL (ref 150–379)
RBC: 4.8 x10E6/uL (ref 3.77–5.28)
RDW: 14.9 % (ref 12.3–15.4)
WBC: 4.1 10*3/uL (ref 3.4–10.8)

## 2017-02-28 LAB — MAGNESIUM: Magnesium: 2.2 mg/dL (ref 1.6–2.3)

## 2017-02-28 LAB — BASIC METABOLIC PANEL
BUN / CREAT RATIO: 24 (ref 12–28)
BUN: 20 mg/dL (ref 8–27)
CALCIUM: 9.3 mg/dL (ref 8.7–10.3)
CHLORIDE: 100 mmol/L (ref 96–106)
CO2: 21 mmol/L (ref 18–29)
CREATININE: 0.85 mg/dL (ref 0.57–1.00)
GFR calc Af Amer: 79 mL/min/{1.73_m2} (ref >59)
GFR calc non Af Amer: 68 mL/min/{1.73_m2} (ref >59)
GLUCOSE: 88 mg/dL (ref 65–99)
Potassium: 4.5 mmol/L (ref 3.5–5.2)
Sodium: 140 mmol/L (ref 134–144)

## 2017-02-28 NOTE — Patient Instructions (Signed)
Medication Instructions:   Your physician recommends that you continue on your current medications as directed. Please refer to the Current Medication list given to you today.   If you need a refill on your cardiac medications before your next appointment, please call your pharmacy.  Labwork:  BMET MAG AND CBC TODAY    Testing/Procedures: NONE ORDERED  TODAY    Follow-Up:  Your physician wants you to follow-up in:  IN 6 MONTHS WITH DR Knox Saliva will receive a reminder letter in the mail two months in advance. If you don't receive a letter, please call our office to schedule the follow-up appointment.       Any Other Special Instructions Will Be Listed Below (If Applicable).

## 2017-03-16 ENCOUNTER — Ambulatory Visit (INDEPENDENT_AMBULATORY_CARE_PROVIDER_SITE_OTHER): Payer: Medicare Other

## 2017-03-16 ENCOUNTER — Encounter: Payer: Self-pay | Admitting: Podiatry

## 2017-03-16 ENCOUNTER — Ambulatory Visit (INDEPENDENT_AMBULATORY_CARE_PROVIDER_SITE_OTHER): Payer: Medicare Other | Admitting: Podiatry

## 2017-03-16 DIAGNOSIS — M779 Enthesopathy, unspecified: Secondary | ICD-10-CM | POA: Diagnosis not present

## 2017-03-16 DIAGNOSIS — M79671 Pain in right foot: Secondary | ICD-10-CM

## 2017-03-16 DIAGNOSIS — M21619 Bunion of unspecified foot: Secondary | ICD-10-CM

## 2017-03-16 DIAGNOSIS — M21611 Bunion of right foot: Secondary | ICD-10-CM | POA: Diagnosis not present

## 2017-03-16 MED ORDER — TRIAMCINOLONE ACETONIDE 10 MG/ML IJ SUSP
10.0000 mg | Freq: Once | INTRAMUSCULAR | Status: AC
  Administered 2017-03-16: 10 mg

## 2017-03-16 NOTE — Progress Notes (Signed)
Subjective:    Patient ID: Frances Smith, female   DOB: 71 y.o.   MRN: [id number]   HPI patient presents stating I have had a lot of pain in the dorsum of my right foot and it's been stinging and it's been worse when I walk and been present for several weeks. Patient also states she has a bunion    ROS      Objective:  Physical Exam Neurovascular status intact negative Homans sign was noted with moderate depression of the arch and structural bunion deformity right with elevation of the intermetatarsal angle. 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 Today I went ahead and I injected the dorsal extensor complex 3 Milligan Kenalog 5 mg Xylocaine instructed on heat ice therapy and possibility for long-term orthotics. Discussed bunion and it may require correction at one point in future  X-ray report indicates her significant elevation of the intermetatarsal angle of approximately 17 with deviation the hallux against the second toe and irritation around the midtarsal joint

## 2017-07-07 ENCOUNTER — Other Ambulatory Visit: Payer: Self-pay | Admitting: Internal Medicine

## 2017-09-24 ENCOUNTER — Other Ambulatory Visit: Payer: Self-pay | Admitting: Internal Medicine

## 2017-12-04 ENCOUNTER — Other Ambulatory Visit: Payer: Self-pay | Admitting: Obstetrics and Gynecology

## 2017-12-04 DIAGNOSIS — Z139 Encounter for screening, unspecified: Secondary | ICD-10-CM

## 2017-12-21 ENCOUNTER — Ambulatory Visit
Admission: RE | Admit: 2017-12-21 | Discharge: 2017-12-21 | Disposition: A | Discharge status: Home / Self Care | Payer: Medicare Other | Source: Ambulatory Visit | Attending: Obstetrics and Gynecology | Admitting: Obstetrics and Gynecology

## 2017-12-21 DIAGNOSIS — Z139 Encounter for screening, unspecified: Secondary | ICD-10-CM

## 2018-01-31 ENCOUNTER — Encounter: Payer: Self-pay | Admitting: Pulmonary Disease

## 2018-01-31 ENCOUNTER — Ambulatory Visit (INDEPENDENT_AMBULATORY_CARE_PROVIDER_SITE_OTHER): Payer: Medicare Other | Admitting: Pulmonary Disease

## 2018-01-31 VITALS — BP 136/86 | HR 66 | Ht 62.0 in | Wt 197.0 lb

## 2018-01-31 DIAGNOSIS — R29818 Other symptoms and signs involving the nervous system: Secondary | ICD-10-CM

## 2018-01-31 NOTE — Patient Instructions (Signed)
Will arrange for home sleep study Will call to arrange for follow up after sleep study reviewed  

## 2018-01-31 NOTE — Progress Notes (Signed)
[facility]  Chief Complaint  Patient presents with  . Follow-up    Pt had sleep study 4 years ago, no cpap machine. Pt is now snoring at night, and waking up very tired, not rested.    Vital signs: BP 136/86 (BP Location: Left Arm, Cuff Size: Normal)   Pulse 66   Ht 5\' 2"  (1.575 m)   Wt 197 lb (89.4 kg)   SpO2 98%   BMI 36.03 kg/m   History of Present Illness: Frances Smith is a 72 y.o. female for evaluation of sleep problems.  She was seen by Dr. Gwenette Greet several years ago.  Had sleep study that didn't show sleep apnea, but had leg movements.  She doesn't think she had enough sleep time.  Has been on requip.  Still has trouble with her sleep.  She snores.  Her husband has to sleep in different bedroom.  She gets dry mouth.  She is restless sleeper.  She had report from her Fit[employer].  Frequent awakenings.  She is tired during the day.  She falls asleep easily when she is sitting quiet.    She goes to sleep at 1030 pm.  She takes requip at this time, but sometimes has to take an extra dose during the day.  She falls asleep quickly sometimes, but other times it can take a while.  She wakes up several times to use the bathroom.  She gets out of bed at 8 am.  She feels tired in the morning.  She denies morning headache.  She does not use anything to help her fall sleep or stay awake.  She denies sleep walking, sleep talking, bruxism, or nightmares.  There is no history of restless legs.  She denies sleep hallucinations, sleep paralysis, or cataplexy.  The Epworth score is 17 out of 24.  Physical Exam:  General - pleasant Eyes - pupils reactive, wears glasses ENT - no sinus tenderness, no oral exudate, no LAN, MP 4, scalloped tongue Cardiac - regular, no murmur Chest - no wheeze, rales Abd - soft, non tender Ext - no edema Skin - no rashes Neuro - normal strength Psych - normal mood  Discussion: She has snoring, sleep disruption, apnea and  daytime sleepiness.  Her symptoms have progressed since sleep study in 2013.  She has hx of depression, and atrial fibrillation.  I am concerned she could have sleep apnea.  We discussed how sleep apnea can affect various health problems, including risks for hypertension, cardiovascular disease, and diabetes.  We also discussed how sleep disruption can increase risks for accidents, such as while driving.  Weight loss as a means of improving sleep apnea was also reviewed.  Additional treatment options discussed were CPAP therapy, oral appliance, and surgical intervention.  Assessment/Plan:  Suspected sleep apnea. - will arrange for home sleep study   Patient Instructions  Will arrange for home sleep study Will call to arrange for follow up after sleep study reviewed    Chesley Mires, MD [place] 01/31/2018, 11:43 AM Pager:  [phone]  Flow Sheet  Sleep tests: PSG 08/01/12 >> AHI 0  Review of Systems: 12 point ROS negative  Past Medical History: She  has a past medical history of Anxiety, Arthritis, Depression, Dysrhythmia, GERD (gastroesophageal reflux disease), H/O: hysterectomy, Heart murmur, History of bronchitis as a child, History of head injury, History of measles, History of mumps, Hyperlipidemia, IBS (irritable bowel syndrome), Menopause, Osteopenia, Paroxysmal atrial fibrillation ([place]), Restless leg  syndrome, and Thyroid disease.  Past Surgical History: She  has a past surgical history that includes Tonsillectomy (1949); Adenoidectomy (1949); Endometrial ablation; Colonoscopy (2010); Vaginal hysterectomy (1984); Incision and drainage abscess anal (Left, 1985); Treatment fistula anal (1988); Breast surgery; and Partial knee arthroplasty (Left, 08/10/2016).  Family History: Her family history includes Allergies in her mother; Anxiety disorder in her brother, daughter, and son; Asthma in her mother; Breast cancer in her mother; Colon cancer (age of onset:  83) in her cousin; Colon cancer (age of onset: 59) in her maternal aunt; Depression in her daughter and son; Heart failure (age of onset: 35) in her father; Thyroid disease in her brother.  Social History: She  reports that she quit smoking about 53 years ago. Her smoking use included cigarettes. She has a 2.50 pack-year smoking history. she has never used smokeless tobacco. She reports that she does not drink alcohol or use drugs.  Medications: Allergies as of 01/31/2018      Reactions   Codeine Itching, Rash      Medication List        Accurate as of 01/31/18 11:43 AM. Always use your most recent med list.          acetaminophen 500 MG tablet Commonly known as:  TYLENOL Take 500 mg by mouth 3 (three) times daily as needed (for pain.).   celecoxib 200 MG capsule Commonly known as:  CELEBREX Take 200 mg by mouth daily.   ELIQUIS 5 MG Tabs tablet Generic drug:  apixaban TAKE 1 TABLET TWICE A DAY   famotidine 40 MG tablet Commonly known as:  PEPCID Take 40 mg by mouth daily.   MULTIVITAMIN ADULT PO Take by mouth daily.   pravastatin 40 MG tablet Commonly known as:  PRAVACHOL Take 40 mg by mouth daily.   propafenone 325 MG 12 hr capsule Commonly known as:  RYTHMOL SR Take one (1) capsule (325 mg) by mouth every 12 hours.   ranitidine 150 MG tablet Commonly known as:  ZANTAC Take 150 mg by mouth 2 (two) times daily as needed (for acid reflux.).   rOPINIRole 1 MG tablet Commonly known as:  REQUIP Take 1 mg by mouth 2 (two) times daily.   sertraline 100 MG tablet Commonly known as:  ZOLOFT Take 100 mg by mouth daily.   thyroid 30 MG tablet Commonly known as:  ARMOUR Take 30 mg by mouth daily before breakfast.

## 2018-02-12 ENCOUNTER — Ambulatory Visit (INDEPENDENT_AMBULATORY_CARE_PROVIDER_SITE_OTHER): Payer: Medicare Other | Admitting: Licensed Clinical Social Worker

## 2018-02-12 DIAGNOSIS — F331 Major depressive disorder, recurrent, moderate: Secondary | ICD-10-CM | POA: Diagnosis not present

## 2018-02-22 DIAGNOSIS — G4733 Obstructive sleep apnea (adult) (pediatric): Secondary | ICD-10-CM | POA: Diagnosis not present

## 2018-02-25 ENCOUNTER — Telehealth: Payer: Self-pay | Admitting: Pulmonary Disease

## 2018-02-25 ENCOUNTER — Encounter: Payer: Self-pay | Admitting: Pulmonary Disease

## 2018-02-25 DIAGNOSIS — G2581 Restless legs syndrome: Secondary | ICD-10-CM

## 2018-02-25 DIAGNOSIS — G4733 Obstructive sleep apnea (adult) (pediatric): Secondary | ICD-10-CM

## 2018-02-25 NOTE — Telephone Encounter (Signed)
HST 02/22/18 >> AHI 10.6, SaO2 low 86%   Will have my nurse inform pt that sleep study shows mild sleep apnea.  Options are 1) CPAP now, 2) ROV first.  If She is agreeable to CPAP, then please send order for auto CPAP range 5 to 15 cm H2O with heated humidity and mask of choice.  Have download sent 1 month after starting CPAP and set up ROV 2 months after starting CPAP.  ROV can be with me or NP.

## 2018-02-26 ENCOUNTER — Other Ambulatory Visit: Payer: Self-pay | Admitting: *Deleted

## 2018-02-26 DIAGNOSIS — R29818 Other symptoms and signs involving the nervous system: Secondary | ICD-10-CM

## 2018-02-26 NOTE — Telephone Encounter (Signed)
LMTCB x1 on preferred phone number listed for patient.  

## 2018-02-27 NOTE — Telephone Encounter (Signed)
Pt is returning call. Cb is [phone].

## 2018-02-27 NOTE — Telephone Encounter (Signed)
Called pt and advised message from the provider. Pt understood and verbalized understanding. Nothing further is needed.   She made an appt with VS to discuss 02/28/18 at 3:45pm.

## 2018-02-28 ENCOUNTER — Other Ambulatory Visit: Payer: Self-pay | Admitting: Nurse Practitioner

## 2018-02-28 ENCOUNTER — Encounter: Payer: Self-pay | Admitting: Pulmonary Disease

## 2018-02-28 ENCOUNTER — Ambulatory Visit (INDEPENDENT_AMBULATORY_CARE_PROVIDER_SITE_OTHER): Payer: Medicare Other | Admitting: Pulmonary Disease

## 2018-02-28 VITALS — BP 124/88 | HR 78 | Ht 62.0 in | Wt 198.0 lb

## 2018-02-28 DIAGNOSIS — M810 Age-related osteoporosis without current pathological fracture: Secondary | ICD-10-CM

## 2018-02-28 DIAGNOSIS — G4733 Obstructive sleep apnea (adult) (pediatric): Secondary | ICD-10-CM | POA: Diagnosis not present

## 2018-02-28 NOTE — Progress Notes (Signed)
[facility], [facility] Care, and Sleep Medicine  Chief Complaint  Patient presents with  . Follow-up    Home sleep study f/u done 02/26/18    Vital signs: BP 124/88 (BP Location: Left Arm, Cuff Size: Normal)   Pulse 78   Ht 5\' 2"  (1.575 m)   Wt 198 lb (89.8 kg)   SpO2 96%   BMI 36.21 kg/m   History of Present Illness: Frances Smith is a 72 y.o. female with obstructive sleep apnea.  She is here to review her sleep study.  Showed mild sleep apnea.  She is snoring, and still has sleep disruption.  She is feeling tired during the day.  Physical Exam:  General - pleasant Eyes - pupils reactive, wears glasses ENT - no sinus tenderness, no oral exudate, no LAN, MP 4, scalloped tongue Cardiac - regular, no murmur Chest - no wheeze, rales Abd - soft, non tender Ext - no edema Skin - no rashes Neuro - normal strength Psych - normal mood   Assessment/Plan:  Obstructive sleep apnea. - We discussed how sleep apnea can affect various health problems, including risks for hypertension, cardiovascular disease, and diabetes.  We also discussed how sleep disruption can increase risks for accidents, such as while driving.  Weight loss as a means of improving sleep apnea was also reviewed.  Additional treatment options discussed were CPAP therapy, oral appliance, and surgical intervention. - will arrange for auto CPAP set up    Patient Instructions  Will arrange for CPAP set up  Follow up in 2 months after getting CPAP set up   Chesley Mires, MD [place] 02/28/2018, 5:12 PM Pager:  [phone]  Flow Sheet  Sleep tests: PSG 08/01/12 >> AHI 0 HST 02/22/18 >> AHI 10.6, SaO2 low 86%  Past Medical History: She  has a past medical history of Anxiety, Arthritis, Depression, Dysrhythmia, GERD (gastroesophageal reflux disease), H/O: hysterectomy, Heart murmur, History of bronchitis as a child, History of head injury, History of measles, History of mumps,  Hyperlipidemia, IBS (irritable bowel syndrome), Menopause, OSA (obstructive sleep apnea), Osteopenia, Paroxysmal atrial fibrillation ([place]), Restless leg syndrome, and Thyroid disease.  Past Surgical History: She  has a past surgical history that includes Tonsillectomy (1949); Adenoidectomy (1949); Endometrial ablation; Colonoscopy (2010); Vaginal hysterectomy (1984); Incision and drainage abscess anal (Left, 1985); Treatment fistula anal (1988); Breast surgery; and Partial knee arthroplasty (Left, 08/10/2016).  Family History: Her family history includes Allergies in her mother; Anxiety disorder in her brother, daughter, and son; Asthma in her mother; Breast cancer in her mother; Colon cancer (age of onset: 54) in her cousin; Colon cancer (age of onset: 26) in her maternal aunt; Depression in her daughter and son; Heart failure (age of onset: 34) in her father; Thyroid disease in her brother.  Social History: She  reports that she quit smoking about 53 years ago. Her smoking use included cigarettes. She has a 2.50 pack-year smoking history. She has never used smokeless tobacco. She reports that she does not drink alcohol or use drugs.  Medications: Allergies as of 02/28/2018      Reactions   Codeine Itching, Rash      Medication List        Accurate as of 02/28/18  5:12 PM. Always use your most recent med list.          acetaminophen 500 MG tablet Commonly known as:  TYLENOL Take 500 mg by mouth 3 (three) times daily as needed (for pain.).   celecoxib  200 MG capsule Commonly known as:  CELEBREX Take 200 mg by mouth daily.   ELIQUIS 5 MG Tabs tablet Generic drug:  apixaban TAKE 1 TABLET TWICE A DAY   famotidine 40 MG tablet Commonly known as:  PEPCID Take 40 mg by mouth daily.   MULTIVITAMIN ADULT PO Take by mouth daily.   pravastatin 40 MG tablet Commonly known as:  PRAVACHOL Take 40 mg by mouth daily.   propafenone 325 MG 12 hr capsule Commonly known as:  RYTHMOL  SR Take one (1) capsule (325 mg) by mouth every 12 hours.   ranitidine 150 MG tablet Commonly known as:  ZANTAC Take 150 mg by mouth 2 (two) times daily as needed (for acid reflux.).   rOPINIRole 1 MG tablet Commonly known as:  REQUIP Take 1 mg by mouth 2 (two) times daily.   sertraline 100 MG tablet Commonly known as:  ZOLOFT Take 150 mg by mouth daily.   thyroid 30 MG tablet Commonly known as:  ARMOUR Take 30 mg by mouth daily before breakfast.

## 2018-02-28 NOTE — Patient Instructions (Signed)
Will arrange for CPAP set up  Follow up in 2 months after getting CPAP set up 

## 2018-03-06 ENCOUNTER — Ambulatory Visit
Admission: RE | Admit: 2018-03-06 | Discharge: 2018-03-06 | Disposition: A | Discharge status: Home / Self Care | Payer: TRICARE For Life (TFL) | Source: Ambulatory Visit | Attending: Nurse Practitioner | Admitting: Nurse Practitioner

## 2018-03-06 DIAGNOSIS — M810 Age-related osteoporosis without current pathological fracture: Secondary | ICD-10-CM

## 2018-03-19 ENCOUNTER — Ambulatory Visit: Payer: Medicare Other | Admitting: Licensed Clinical Social Worker

## 2018-03-19 ENCOUNTER — Ambulatory Visit (INDEPENDENT_AMBULATORY_CARE_PROVIDER_SITE_OTHER): Payer: Medicare Other | Admitting: Licensed Clinical Social Worker

## 2018-03-19 DIAGNOSIS — F3341 Major depressive disorder, recurrent, in partial remission: Secondary | ICD-10-CM

## 2018-03-24 ENCOUNTER — Other Ambulatory Visit: Payer: Self-pay | Admitting: Internal Medicine

## 2018-04-03 ENCOUNTER — Ambulatory Visit (INDEPENDENT_AMBULATORY_CARE_PROVIDER_SITE_OTHER): Payer: Medicare Other | Admitting: Licensed Clinical Social Worker

## 2018-04-03 DIAGNOSIS — F3341 Major depressive disorder, recurrent, in partial remission: Secondary | ICD-10-CM

## 2018-04-17 ENCOUNTER — Ambulatory Visit (INDEPENDENT_AMBULATORY_CARE_PROVIDER_SITE_OTHER): Payer: Medicare Other | Admitting: Licensed Clinical Social Worker

## 2018-04-17 DIAGNOSIS — F3341 Major depressive disorder, recurrent, in partial remission: Secondary | ICD-10-CM | POA: Diagnosis not present

## 2018-04-20 ENCOUNTER — Telehealth: Payer: Self-pay | Admitting: Pulmonary Disease

## 2018-04-20 NOTE — Telephone Encounter (Signed)
Rec'd letter from [employer] regarding multiply voicemail's to schedule appointment for new cpap machine set up. Left voice mail on machine for patient to return phone call back regarding fax from [place]. X1  Pt needs to call [facility] at phone [phone] or [phone]

## 2018-04-25 NOTE — Telephone Encounter (Signed)
ATC pt- unable to leave voicemail, as mailbox is full. Will call back

## 2018-04-26 NOTE — Telephone Encounter (Signed)
Left voice mail on machine for patient to return phone call back regarding results. X3 Attempted several times to contact pt; no response; mailed letter in mail today. Mailed letter in mail today. Nothing further needed today.

## 2018-04-30 ENCOUNTER — Ambulatory Visit (INDEPENDENT_AMBULATORY_CARE_PROVIDER_SITE_OTHER): Payer: Medicare Other | Admitting: Licensed Clinical Social Worker

## 2018-04-30 DIAGNOSIS — F3341 Major depressive disorder, recurrent, in partial remission: Secondary | ICD-10-CM | POA: Diagnosis not present

## 2018-05-04 ENCOUNTER — Telehealth: Payer: Self-pay | Admitting: Pulmonary Disease

## 2018-05-04 NOTE — Telephone Encounter (Signed)
Spoke with pt. She is aware that we have been trying to reach her to let her know that she needs to contact [facility] to be setup with a CPAP machine. States that she spoke with [facility] this morning and she scheduled her appointment. Nothing further was needed.

## 2018-05-14 ENCOUNTER — Ambulatory Visit (INDEPENDENT_AMBULATORY_CARE_PROVIDER_SITE_OTHER): Payer: Medicare Other | Admitting: Licensed Clinical Social Worker

## 2018-05-14 DIAGNOSIS — F3341 Major depressive disorder, recurrent, in partial remission: Secondary | ICD-10-CM

## 2018-06-04 ENCOUNTER — Ambulatory Visit (INDEPENDENT_AMBULATORY_CARE_PROVIDER_SITE_OTHER): Payer: TRICARE For Life (TFL) | Admitting: Pulmonary Disease

## 2018-06-04 ENCOUNTER — Ambulatory Visit (INDEPENDENT_AMBULATORY_CARE_PROVIDER_SITE_OTHER): Payer: Medicare Other | Admitting: Licensed Clinical Social Worker

## 2018-06-04 DIAGNOSIS — F3341 Major depressive disorder, recurrent, in partial remission: Secondary | ICD-10-CM | POA: Diagnosis not present

## 2018-06-04 DIAGNOSIS — G4733 Obstructive sleep apnea (adult) (pediatric): Secondary | ICD-10-CM

## 2018-06-12 NOTE — Progress Notes (Signed)
Scheduled in error.

## 2018-06-18 ENCOUNTER — Ambulatory Visit: Payer: Medicare Other | Admitting: Licensed Clinical Social Worker

## 2018-06-18 DIAGNOSIS — M25511 Pain in right shoulder: Secondary | ICD-10-CM | POA: Insufficient documentation

## 2018-06-22 ENCOUNTER — Ambulatory Visit: Payer: TRICARE For Life (TFL) | Admitting: Pulmonary Disease

## 2018-07-09 ENCOUNTER — Ambulatory Visit (INDEPENDENT_AMBULATORY_CARE_PROVIDER_SITE_OTHER): Payer: Medicare Other | Admitting: Licensed Clinical Social Worker

## 2018-07-09 DIAGNOSIS — F3341 Major depressive disorder, recurrent, in partial remission: Secondary | ICD-10-CM

## 2018-07-12 ENCOUNTER — Ambulatory Visit: Payer: Medicare Other | Admitting: Licensed Clinical Social Worker

## 2018-07-16 ENCOUNTER — Ambulatory Visit (INDEPENDENT_AMBULATORY_CARE_PROVIDER_SITE_OTHER): Payer: Medicare Other | Admitting: Pulmonary Disease

## 2018-07-16 ENCOUNTER — Encounter: Payer: Self-pay | Admitting: Pulmonary Disease

## 2018-07-16 VITALS — BP 122/64 | HR 69 | Ht 62.0 in | Wt 195.0 lb

## 2018-07-16 DIAGNOSIS — G2581 Restless legs syndrome: Secondary | ICD-10-CM | POA: Diagnosis not present

## 2018-07-16 DIAGNOSIS — G4733 Obstructive sleep apnea (adult) (pediatric): Secondary | ICD-10-CM

## 2018-07-16 DIAGNOSIS — M7501 Adhesive capsulitis of right shoulder: Secondary | ICD-10-CM | POA: Insufficient documentation

## 2018-07-16 NOTE — Progress Notes (Signed)
[facility]  Chief Complaint  Patient presents with  . Follow-up    Wearing cpap avg 6-8h nightly- feels preesure & mask are okay. JJK:[id number]    Constitutional: BP 122/64 (BP Location: Left Arm, Cuff Size: Normal)   Pulse 69   Ht 5\' 2"  (1.575 m)   Wt 195 lb (88.5 kg) Comment: weight is per pt  SpO2 97%   BMI 35.67 kg/m   History of Present Illness: Frances Smith is a 72 y.o. female with obstructive sleep apnea.  She had home sleep study in April.  Found to have mild sleep apnea.  Started on auto CPAP.  No issues with mask fit.  She feels this helps her sleep and daytime alertness.  No issues with sinus congestion, dry mouth, sore throat, or aerophagia.  Has hx of TMJ and had recent dental procedure.  Her mouth has been sore.  Also has problem with Rt shoulder, and sometimes make it difficult for her to put on mask.  She also has restless leg syndrome.  Has noticed symptoms getting worse recently, and change ropinerole to 2 mg at night.  This has helped.  No history of anemia or low ferritin.   Comprehensive Respiratory Exam:  Appearance - well kempt  ENMT - nasal mucosa moist, turbinates clear, midline nasal septum, no dental lesions, no gingival bleeding, no oral exudates, no tonsillar hypertrophy, MP 4, scalloped tongue, over bite Neck - no masses, trachea midline, no thyromegaly, no elevation in JVP Respiratory - normal appearance of chest wall, normal respiratory effort w/o accessory muscle use, no dullness on percussion, no wheezing or rales CV - s1s2 regular rate and rhythm, no murmurs, no peripheral edema, radial pulses symmetric GI - soft, non tender, no masses Lymph - no adenopathy noted in neck and axillary areas MSK - normal muscle strength and tone, normal gait Ext - no cyanosis, clubbing, or joint inflammation noted Skin - no rashes, lesions, or ulcers Neuro - oriented to person, place, and time Psych - normal mood and  affect   Assessment/Plan:  Obstructive sleep apnea. - she is compliant with CPAP and reports benefit from therapy - continue auto CPAP - reviewed options to assist with mask fit  Restless leg syndrome. - she had recent increase in dose of ropinerole - discussed symptoms to monitor for that would raise concern she is developing augmentation from ropinerole use, and in this situation she might need to change to different class of medication - she is followed by primary care for this    Patient Instructions  Follow up in 1 year   Chesley Mires, MD [place] 07/16/2018, 12:12 PM Pager:  [phone]  Flow Sheet  Sleep tests: PSG 08/01/12 >> AHI 0 HST 02/22/18 >> AHI 10.6, SaO2 low 86% Auto CPAP 06/13/18 to 07/12/18 >> used on 29 of 30 nights with average 6 hrs 37 min.  Average AHI 0.8 with median CPAP 7 and 95 th percentile CPAP 9 cm H2O  Past Medical History: She  has a past medical history of Anxiety, Arthritis, Depression, Dysrhythmia, GERD (gastroesophageal reflux disease), H/O: hysterectomy, Heart murmur, History of bronchitis as a child, History of head injury, History of measles, History of mumps, Hyperlipidemia, IBS (irritable bowel syndrome), Menopause, OSA (obstructive sleep apnea), Osteopenia, Paroxysmal atrial fibrillation ([place]), Restless leg syndrome, and Thyroid disease.  Past Surgical History: She  has a past surgical history that includes Tonsillectomy (1949); Adenoidectomy (1949); Endometrial ablation; Colonoscopy (2010); Vaginal hysterectomy (  1984); Incision and drainage abscess anal (Left, 1985); Treatment fistula anal (1988); Breast surgery; and Partial knee arthroplasty (Left, 08/10/2016).  Family History: Her family history includes Allergies in her mother; Anxiety disorder in her brother, daughter, and son; Asthma in her mother; Breast cancer in her mother; Colon cancer (age of onset: 15) in her cousin; Colon cancer (age of onset: 77) in her  maternal aunt; Depression in her daughter and son; Heart failure (age of onset: 46) in her father; Thyroid disease in her brother.  Social History: She  reports that she quit smoking about 53 years ago. Her smoking use included cigarettes. She has a 2.50 pack-year smoking history. She has never used smokeless tobacco. She reports that she does not drink alcohol or use drugs.  Medications: Allergies as of 07/16/2018      Reactions   Codeine Itching, Rash      Medication List        Accurate as of 07/16/18 12:12 PM. Always use your most recent med list.          acetaminophen 500 MG tablet Commonly known as:  TYLENOL Take 500 mg by mouth 3 (three) times daily as needed (for pain.).   celecoxib 200 MG capsule Commonly known as:  CELEBREX Take 200 mg by mouth daily.   clindamycin 150 MG capsule Commonly known as:  CLEOCIN TK ONE C PO TID UNTIL GONE   ELIQUIS 5 MG Tabs tablet Generic drug:  apixaban TAKE 1 TABLET TWICE A DAY   famotidine 40 MG tablet Commonly known as:  PEPCID Take 40 mg by mouth daily.   MULTIVITAMIN ADULT PO Take by mouth daily.   pravastatin 40 MG tablet Commonly known as:  PRAVACHOL Take 40 mg by mouth daily.   propafenone 325 MG 12 hr capsule Commonly known as:  RYTHMOL SR TAKE 1 CAPSULE EVERY 12 HOURS. Please make overdue yearly appt with Dr. Lovena Le before anymore refills. 1st attempt   ranitidine 150 MG tablet Commonly known as:  ZANTAC Take 150 mg by mouth 2 (two) times daily as needed (for acid reflux.).   rOPINIRole 1 MG tablet Commonly known as:  REQUIP Take 1 mg by mouth 2 (two) times daily.   sertraline 100 MG tablet Commonly known as:  ZOLOFT Take 150 mg by mouth daily.   thyroid 30 MG tablet Commonly known as:  ARMOUR Take 30 mg by mouth daily before breakfast.

## 2018-07-16 NOTE — Patient Instructions (Signed)
Follow up in 1 year.

## 2018-07-18 ENCOUNTER — Ambulatory Visit (INDEPENDENT_AMBULATORY_CARE_PROVIDER_SITE_OTHER): Payer: Medicare Other | Admitting: Licensed Clinical Social Worker

## 2018-07-18 DIAGNOSIS — F3341 Major depressive disorder, recurrent, in partial remission: Secondary | ICD-10-CM

## 2018-07-30 ENCOUNTER — Ambulatory Visit: Payer: Medicare Other | Admitting: Licensed Clinical Social Worker

## 2018-08-03 ENCOUNTER — Other Ambulatory Visit: Payer: Self-pay | Admitting: Internal Medicine

## 2018-08-06 ENCOUNTER — Ambulatory Visit (INDEPENDENT_AMBULATORY_CARE_PROVIDER_SITE_OTHER): Payer: Medicare Other | Admitting: Licensed Clinical Social Worker

## 2018-08-06 DIAGNOSIS — F3341 Major depressive disorder, recurrent, in partial remission: Secondary | ICD-10-CM | POA: Diagnosis not present

## 2018-08-09 ENCOUNTER — Other Ambulatory Visit: Payer: Self-pay | Admitting: Internal Medicine

## 2018-08-09 NOTE — Telephone Encounter (Signed)
Eliquis 5mg  refill request received; pt is 72 yrs old, wt-88.5kg, Crea-0.80 on 12/20/17, last seen by Tommye Standard on 02/28/2017-therefore pt needs an appt.

## 2018-08-20 ENCOUNTER — Ambulatory Visit (INDEPENDENT_AMBULATORY_CARE_PROVIDER_SITE_OTHER): Payer: Medicare Other | Admitting: Licensed Clinical Social Worker

## 2018-08-20 DIAGNOSIS — F3341 Major depressive disorder, recurrent, in partial remission: Secondary | ICD-10-CM | POA: Diagnosis not present

## 2018-08-23 ENCOUNTER — Other Ambulatory Visit: Payer: Self-pay | Admitting: *Deleted

## 2018-08-23 MED ORDER — APIXABAN 5 MG PO TABS: 5.0000 mg | ORAL_TABLET | Freq: Two times a day (BID) | ORAL | 0 refills | Status: DC

## 2018-08-23 NOTE — Telephone Encounter (Signed)
Eliquis 5mg  refill request received; pt is 72 yrs old, wt-88.5kg, Crea-0.80 on 12/20/17 per [facility] via [facility], last seen by Tommye Standard on 02/28/17 and was due back in 6 months per OV note and has an appt scheduled on 09/25/2018; will send a refill.

## 2018-08-27 NOTE — Telephone Encounter (Signed)
Pt called back and scheduled follow-up appt with Dr Lovena Le for 09/25/18.  Will refill rx to get to next appt.

## 2018-08-31 ENCOUNTER — Other Ambulatory Visit: Payer: Self-pay | Admitting: *Deleted

## 2018-08-31 ENCOUNTER — Telehealth: Payer: Self-pay | Admitting: Internal Medicine

## 2018-08-31 MED ORDER — PROPAFENONE HCL ER 325 MG PO CP12: ORAL_CAPSULE | ORAL | 0 refills | Status: DC

## 2018-08-31 NOTE — Telephone Encounter (Signed)
°*  STAT* If patient is at the pharmacy, call can be transferred to refill team.   1. Which medications need to be refilled? (please list name of each medication and dose if known) Propafenone  2. Which pharmacy/location (including street and city if local pharmacy) is medication to be sent to? [facility]- [phone]  3. Do they need a 30 day or 90 day supply? Enough until her appt on 09-25-18

## 2018-09-06 ENCOUNTER — Ambulatory Visit (INDEPENDENT_AMBULATORY_CARE_PROVIDER_SITE_OTHER): Payer: Medicare Other | Admitting: Licensed Clinical Social Worker

## 2018-09-06 DIAGNOSIS — F3341 Major depressive disorder, recurrent, in partial remission: Secondary | ICD-10-CM

## 2018-09-19 ENCOUNTER — Ambulatory Visit: Payer: Medicare Other | Admitting: Licensed Clinical Social Worker

## 2018-09-25 ENCOUNTER — Encounter: Payer: Self-pay | Admitting: Internal Medicine

## 2018-09-25 ENCOUNTER — Ambulatory Visit (INDEPENDENT_AMBULATORY_CARE_PROVIDER_SITE_OTHER): Payer: Medicare Other | Admitting: Internal Medicine

## 2018-09-25 VITALS — BP 124/72 | HR 66 | Ht 62.0 in | Wt 197.0 lb

## 2018-09-25 DIAGNOSIS — I480 Paroxysmal atrial fibrillation: Secondary | ICD-10-CM | POA: Diagnosis not present

## 2018-09-25 DIAGNOSIS — K219 Gastro-esophageal reflux disease without esophagitis: Secondary | ICD-10-CM | POA: Insufficient documentation

## 2018-09-25 MED ORDER — PROPAFENONE HCL ER 325 MG PO CP12: ORAL_CAPSULE | ORAL | 3 refills | Status: DC

## 2018-09-25 MED ORDER — APIXABAN 5 MG PO TABS: 5.0000 mg | ORAL_TABLET | Freq: Two times a day (BID) | ORAL | 3 refills | Status: DC

## 2018-09-25 NOTE — Patient Instructions (Signed)
Medication Instructions:  Your physician recommends that you continue on your current medications as directed. Please refer to the Current Medication list given to you today.   Labwork: none  Testing/Procedures: Your physician has requested that you have an echocardiogram. Echocardiography is a painless test that uses sound waves to create images of your heart. It provides your doctor with information about the size and shape of your heart and how well your heart's chambers and valves are working. This procedure takes approximately one hour. There are no restrictions for this procedure.    Follow-Up: Your physician recommends that you schedule a follow-up appointment in: 4 months with Dr. Lovena Le.    Any Other Special Instructions Will Be Listed Below (If Applicable).     If you need a refill on your cardiac medications before your next appointment, please call your pharmacy.

## 2018-09-25 NOTE — Progress Notes (Signed)
HPI Frances Smith returns today for followup. She is a pleasant 72 yo woman with a h/o atrial flutter, s/p ablation, PAF which has been well controlled on Propafenone. She had done well in the interim but over the past month has had an uptick in her atrial fib which lasts 4-20 hours a day. Other than that she has had no chest pain or sob. She has had problems with arthritis. She was started on systemic anti-coagulation when I saw her last time. She admits to dietary indiscretion and has gained weight.  Allergies  Allergen Reactions  . Codeine Itching, Rash and Hives     Current Outpatient Medications  Medication Sig Dispense Refill  . acetaminophen (TYLENOL) 500 MG tablet Take 500 mg by mouth 3 (three) times daily as needed (for pain.).    Marland Kitchen celecoxib (CELEBREX) 200 MG capsule Take 200 mg by mouth daily.    Marland Kitchen ELIQUIS 5 MG TABS tablet TAKE 1 TABLET TWICE A DAY 180 tablet 0  . famotidine (PEPCID) 40 MG tablet Take 40 mg by mouth 2 (two) times daily.     . Multiple Vitamins-Minerals (MULTIVITAMIN ADULT PO) Take by mouth daily.    . pravastatin (PRAVACHOL) 40 MG tablet Take 40 mg by mouth daily.      . propafenone (RYTHMOL SR) 325 MG 12 hr capsule TAKE 1 CAPSULE BY MOUTH EVERY 12 HOURS Please keep upcoming appointment for further refills 60 capsule 0  . sertraline (ZOLOFT) 100 MG tablet Take 150 mg by mouth daily.     Marland Kitchen thyroid (ARMOUR) 30 MG tablet Take 30 mg by mouth daily before breakfast.     No current facility-administered medications for this visit.      Past Medical History:  Diagnosis Date  . Anxiety   . Arthritis   . Depression   . Dysrhythmia   . GERD (gastroesophageal reflux disease)   . H/O: hysterectomy   . Heart murmur   . History of bronchitis as a child   . History of head injury   . History of measles   . History of mumps   . Hyperlipidemia   . IBS (irritable bowel syndrome)   . Menopause   . OSA (obstructive sleep apnea)   . Osteopenia   . Paroxysmal  atrial fibrillation (HCC)   . Restless leg syndrome   . Thyroid disease     ROS:   All systems reviewed and negative except as noted in the HPI.   Past Surgical History:  Procedure Laterality Date  . ADENOIDECTOMY  1949  . BREAST SURGERY     aspiration of cyst / left   . COLONOSCOPY  2010  . ENDOMETRIAL ABLATION    . INCISION AND DRAINAGE ABSCESS ANAL Left 1985   breast  . PARTIAL KNEE ARTHROPLASTY Left 08/10/2016   Procedure: LEFT KNEE UNICOMPARTMENTAL ARTHROPLASTY;  Surgeon: Gaynelle Arabian, MD;  Location: [facility];  Service: Orthopedics;  Laterality: Left;  . TONSILLECTOMY  1949  . TREATMENT FISTULA ANAL  1988  . VAGINAL HYSTERECTOMY  1984     Family History  Problem Relation Age of Onset  . Heart failure Father 51  . Allergies Mother   . Asthma Mother   . Breast cancer Mother   . Colon cancer Maternal Aunt 33  . Colon cancer Cousin 110  . Thyroid disease Brother   . Anxiety disorder Daughter   . Depression Daughter   . Anxiety disorder Son   . Depression Son   .  Anxiety disorder Brother      Social History   Socioeconomic History  . Marital status: Married    Spouse name: Not on file  . Number of children: 2  . Years of education: Not on file  . Highest education level: Not on file  Occupational History  . Occupation: retired    Comment: Therapist, sports, Geophysicist/field seismologist  Social Needs  . Financial resource strain: Not on file  . Food insecurity:    Worry: Not on file    Inability: Not on file  . Transportation needs:    Medical: Not on file    Non-medical: Not on file  Tobacco Use  . Smoking status: Former Smoker    Packs/day: 0.50    Years: 5.00    Pack years: 2.50    Types: Cigarettes    Last attempt to quit: 11/21/1964    Years since quitting: 53.8  . Smokeless tobacco: Never Used  Substance and Sexual Activity  . Alcohol use: No  . Drug use: No  . Sexual activity: Not on file  Lifestyle  . Physical activity:    Days per week: Not on file    Minutes  per session: Not on file  . Stress: Not on file  Relationships  . Social connections:    Talks on phone: Not on file    Gets together: Not on file    Attends religious service: Not on file    Active member of club or organization: Not on file    Attends meetings of clubs or organizations: Not on file    Relationship status: Not on file  . Intimate partner violence:    Fear of current or ex partner: Not on file    Emotionally abused: Not on file    Physically abused: Not on file    Forced sexual activity: Not on file  Other Topics Concern  . Not on file  Social History Narrative  . Not on file     BP 124/72   Pulse 66   Ht 5\' 2"  (1.575 m)   Wt 197 lb (89.4 kg)   BMI 36.03 kg/m   Physical Exam:  obese appearing 72 yo woman,  NAD HEENT: Unremarkable Neck:  No JVD, no thyromegally Lymphatics:  No adenopathy Back:  No CVA tenderness Lungs:  Clear with no wheezes HEART:  Regular rate rhythm, no murmurs, no rubs, no clicks Abd:  soft, positive bowel sounds, no organomegally, no rebound, no guarding Ext:  2 plus pulses, no edema, no cyanosis, no clubbing Skin:  No rashes no nodules Neuro:  CN II through XII intact, motor grossly intact  EKG - nsr   Assess/Plan: 1. PAF - she has had worsening episodes. I have reviewed the treatment options with the patient in detail, including rate control, continuing her current meds, switching to either amio or dofetilide, and or considering catheter ablation. With her echo we will evaluate her LA dimension. 2. Dyspnea - she has had more of this and could be due to pulmonary HTN. I have asked her to undergo a 2D echo.  3. HTN - her blood pressure appears controlled on medical therapy. 4. Coags - she will continue her systemic anti-coagulation with Eliquis.  Mikle Bosworth.D.

## 2018-10-01 ENCOUNTER — Other Ambulatory Visit (HOSPITAL_COMMUNITY): Payer: Self-pay

## 2018-10-02 ENCOUNTER — Ambulatory Visit (HOSPITAL_COMMUNITY): Payer: Medicare Other | Attending: Cardiology

## 2018-10-02 ENCOUNTER — Other Ambulatory Visit: Payer: Self-pay

## 2018-10-02 DIAGNOSIS — I480 Paroxysmal atrial fibrillation: Secondary | ICD-10-CM

## 2018-10-03 ENCOUNTER — Ambulatory Visit: Payer: Medicare Other | Admitting: Licensed Clinical Social Worker

## 2018-10-08 ENCOUNTER — Telehealth: Payer: Self-pay

## 2018-10-08 DIAGNOSIS — I480 Paroxysmal atrial fibrillation: Secondary | ICD-10-CM

## 2018-10-08 NOTE — Telephone Encounter (Signed)
Call placed to Pt.  Advised of ECHO results.  Advised Dr. Lovena Le would like to refer her for afib ablation.  Per Pt she would like to be referred to Dr. Rayann Heman.

## 2018-10-08 NOTE — Telephone Encounter (Signed)
-----   Message from Evans Lance, MD sent at 10/06/2018  8:52 PM EST ----- Preserved LV function and only minimally enlarged LA. Needs referral to Dr. Curt Bears for Afib ablation.

## 2018-10-22 ENCOUNTER — Encounter: Payer: Self-pay | Admitting: Internal Medicine

## 2018-10-29 ENCOUNTER — Encounter: Payer: Self-pay | Admitting: Internal Medicine

## 2018-10-29 ENCOUNTER — Ambulatory Visit (INDEPENDENT_AMBULATORY_CARE_PROVIDER_SITE_OTHER): Payer: Medicare Other | Admitting: Internal Medicine

## 2018-10-29 VITALS — BP 130/74 | HR 68 | Ht 62.0 in | Wt 197.8 lb

## 2018-10-29 DIAGNOSIS — I480 Paroxysmal atrial fibrillation: Secondary | ICD-10-CM | POA: Diagnosis not present

## 2018-10-29 NOTE — Progress Notes (Signed)
Electrophysiology Office Note Date: 10/29/2018  ID:  Frances, Smith [birth year], MRN [id number]  PCP: Chesley Noon, MD  Electrophysiologist: Dr Lovena Le  CC: Follow up for atrial fibrillation  Frances Smith is a 72 y.o. female seen today for consideration of atrial fibrillation ablation. Patient has a history of paroxsymal atrial fibrillation which was initially diagnosed in the 1990s.  She says that she underwent afib ablation at [facility] in 1999.  She did very well initially following ablation.  She subsequently had further afib and was placed on rhythmol.  She did well for quite some time.  More recently, she has noticed an increase in her episodes frequency and duration over the last few months, although this has improved about two weeks ago. She has been maintained on Rythmol which has controlled her symptoms for the most part. She states that she tolerates her episodes well with no CP, SOB, or fatigue. She is just aware of her palpitations. She is compliant with her CPAP. She reports that she is scheduled for a GI evaluation soon for bleeding.  She denies chest pain, dyspnea, PND, orthopnea, nausea, vomiting, dizziness, syncope, edema, weight gain, or early satiety.  Past Medical History:  Diagnosis Date  . Anxiety   . Arthritis   . Depression   . Dysrhythmia   . GERD (gastroesophageal reflux disease)   . H/O: hysterectomy   . Heart murmur   . History of bronchitis as a child   . History of head injury   . History of measles   . History of mumps   . Hyperlipidemia   . IBS (irritable bowel syndrome)   . Menopause   . OSA (obstructive sleep apnea)   . Osteopenia   . Paroxysmal atrial fibrillation (HCC)   . Restless leg syndrome   . Thyroid disease    Past Surgical History:  Procedure Laterality Date  . ADENOIDECTOMY  1949  . BREAST SURGERY     aspiration of cyst / left   . COLONOSCOPY  2010  . ENDOMETRIAL ABLATION    . INCISION AND DRAINAGE ABSCESS  ANAL Left 1985   breast  . PARTIAL KNEE ARTHROPLASTY Left 08/10/2016   Procedure: LEFT KNEE UNICOMPARTMENTAL ARTHROPLASTY;  Surgeon: Gaynelle Arabian, MD;  Location: [facility];  Service: Orthopedics;  Laterality: Left;  . TONSILLECTOMY  1949  . TREATMENT FISTULA ANAL  1988  . VAGINAL HYSTERECTOMY  1984    Current Outpatient Medications  Medication Sig Dispense Refill  . acetaminophen (TYLENOL) 500 MG tablet Take 500 mg by mouth 3 (three) times daily as needed (for pain.).    Marland Kitchen apixaban (ELIQUIS) 5 MG TABS tablet Take 1 tablet (5 mg total) by mouth 2 (two) times daily. 180 tablet 3  . b complex vitamins tablet Take 1 tablet by mouth daily.    . celecoxib (CELEBREX) 200 MG capsule Take 200 mg by mouth daily.    . Cholecalciferol (D3-1000) 1000 units capsule Take 1,000 Units by mouth daily.    . famotidine (PEPCID) 40 MG tablet Take 40 mg by mouth 2 (two) times daily.     Marland Kitchen L-Serine POWD by Does not apply route as directed.    Marland Kitchen MAGNESIUM CHLORIDE-CALCIUM PO Take 1 tablet by mouth daily.    . Multiple Vitamins-Minerals (MULTIVITAMIN ADULT PO) Take by mouth daily.    Marland Kitchen OVER THE COUNTER MEDICATION Chinese Herbs, Great MenderTea Pills 8 tabs daily, Joint Obstruction Tea Pills 8 Tabs daily , Stomach Vigor  5 tabs daily    . pravastatin (PRAVACHOL) 40 MG tablet Take 40 mg by mouth daily.      . Probiotic Product (PROBIOTIC DAILY PO) Take 1 tablet by mouth daily.    . propafenone (RYTHMOL SR) 325 MG 12 hr capsule TAKE 1 CAPSULE BY MOUTH EVERY 12 HOURS 180 capsule 3  . rOPINIRole (REQUIP) 3 MG tablet Take 3 mg by mouth at bedtime.    . sertraline (ZOLOFT) 100 MG tablet Take 150 mg by mouth daily.     Marland Kitchen thyroid (ARMOUR) 30 MG tablet Take 30 mg by mouth daily before breakfast.    . TURMERIC PO Take 1 tablet by mouth daily.     No current facility-administered medications for this visit.     Allergies:   Codeine   Social History: Social History   Socioeconomic History  . Marital status: Married     Spouse name: Not on file  . Number of children: 2  . Years of education: Not on file  . Highest education level: Not on file  Occupational History  . Occupation: retired    Comment: Therapist, sports, Geophysicist/field seismologist  Social Needs  . Financial resource strain: Not on file  . Food insecurity:    Worry: Not on file    Inability: Not on file  . Transportation needs:    Medical: Not on file    Non-medical: Not on file  Tobacco Use  . Smoking status: Former Smoker    Packs/day: 0.50    Years: 5.00    Pack years: 2.50    Types: Cigarettes    Last attempt to quit: 11/21/1964    Years since quitting: 53.9  . Smokeless tobacco: Never Used  Substance and Sexual Activity  . Alcohol use: No  . Drug use: No  . Sexual activity: Not on file  Lifestyle  . Physical activity:    Days per week: Not on file    Minutes per session: Not on file  . Stress: Not on file  Relationships  . Social connections:    Talks on phone: Not on file    Gets together: Not on file    Attends religious service: Not on file    Active member of club or organization: Not on file    Attends meetings of clubs or organizations: Not on file    Relationship status: Not on file  . Intimate partner violence:    Fear of current or ex partner: Not on file    Emotionally abused: Not on file    Physically abused: Not on file    Forced sexual activity: Not on file  Other Topics Concern  . Not on file  Social History Narrative  . Not on file    Family History: Family History  Problem Relation Age of Onset  . Heart failure Father 42  . Allergies Mother   . Asthma Mother   . Breast cancer Mother   . Colon cancer Maternal Aunt 72  . Colon cancer Cousin 16  . Thyroid disease Brother   . Anxiety disorder Daughter   . Depression Daughter   . Anxiety disorder Son   . Depression Son   . Anxiety disorder Brother     Review of Systems: All other systems reviewed and are otherwise negative except as noted above.   Physical  Exam: VS:  BP 130/74   Pulse 68   Ht 5\' 2"  (1.575 m)   Wt 197 lb 12.8 oz (89.7 kg)  SpO2 98%   BMI 36.18 kg/m  , BMI Body mass index is 36.18 kg/m. Wt Readings from Last 3 Encounters:  10/29/18 197 lb 12.8 oz (89.7 kg)  09/25/18 197 lb (89.4 kg)  07/16/18 195 lb (88.5 kg)    GEN- The patient is well appearing, alert and oriented x 3 today.   HEENT: normocephalic, atraumatic; sclera clear, conjunctiva pink; hearing intact; oropharynx clear; neck supple, no JVP Lungs- Clear to ausculation bilaterally, normal work of breathing.  No wheezes, rales, rhonchi Heart- Regular rate and rhythm, no murmurs, rubs or gallops, PMI not laterally displaced Extremities- no clubbing, cyanosis, or edema MS- no significant deformity or atrophy Skin- warm and dry, no rash or lesion  Psych- euthymic mood, full affect Neuro- strength and sensation are intact   EKG:  EKG is ordered today. The ekg ordered today shows sinus rhythm HR 68, PR 174, QRS 78, QTc 433   Other studies Reviewed: Additional studies/ records that were reviewed today include: Dr Lovena Le notes, echo 10/02/18 Review of the above records today demonstrates:  Echo - EF 60-65%, mild LVH, LA mildly dilated 77mm  Assessment and Plan:  1. Paroxsymal atrial fibrillation Long history of paroxsymal afib, patient reports she had an ablation in [place] in 1999. We discussed various therapeutic options including Multaq, sotalol, Tikosyn, amiodarone and afib ablation. Risks and benefits discussed in detail. At this point, patient would like to stay on her Rhythmol at her current dose through the holiday season. We will revisit ablation or make a change in therapy if her symptoms worsen.  I agree with Dr Lovena Le that ablation would likely be her best option.  Multaq could also be considered.  Given high dose SSIR, I am less enthusiastic about sotalol. Continue Eliquis 5mg  BID for CHADS2VASC score of at least 3 Continue Rythmol 325mg  BID  2.  HTN Stable, no changes today  3. OSA Patient reports compliance with CPAP therapy.  Current medicines are reviewed at length with the patient today.   The patient does not have concerns regarding her medicines.  The following changes were made today: none  Labs/ tests ordered today include:  Orders Placed This Encounter  Procedures  . EKG 12-Lead     Disposition:   Follow up with me in 3 months. Patient given Afib clinic contact information.   Army Fossa MD 10/29/2018 3:07 PM   [address] [place] [address] [phone] (office) [phone] (fax)

## 2018-10-29 NOTE — Patient Instructions (Signed)

## 2018-10-31 ENCOUNTER — Encounter: Payer: Self-pay | Admitting: Internal Medicine

## 2018-10-31 ENCOUNTER — Other Ambulatory Visit (INDEPENDENT_AMBULATORY_CARE_PROVIDER_SITE_OTHER): Payer: Medicare Other

## 2018-10-31 ENCOUNTER — Telehealth: Payer: Self-pay

## 2018-10-31 ENCOUNTER — Ambulatory Visit (INDEPENDENT_AMBULATORY_CARE_PROVIDER_SITE_OTHER): Payer: Medicare Other | Admitting: Internal Medicine

## 2018-10-31 VITALS — BP 140/80 | HR 72 | Ht 61.52 in | Wt 198.5 lb

## 2018-10-31 DIAGNOSIS — K219 Gastro-esophageal reflux disease without esophagitis: Secondary | ICD-10-CM

## 2018-10-31 DIAGNOSIS — Z7901 Long term (current) use of anticoagulants: Secondary | ICD-10-CM | POA: Diagnosis not present

## 2018-10-31 DIAGNOSIS — R197 Diarrhea, unspecified: Secondary | ICD-10-CM

## 2018-10-31 DIAGNOSIS — K625 Hemorrhage of anus and rectum: Secondary | ICD-10-CM | POA: Diagnosis not present

## 2018-10-31 DIAGNOSIS — Z86010 Personal history of colonic polyps: Secondary | ICD-10-CM

## 2018-10-31 LAB — IGA: IgA: 137 mg/dL (ref 68–378)

## 2018-10-31 MED ORDER — NA SULFATE-K SULFATE-MG SULF 17.5-3.13-1.6 GM/177ML PO SOLN: 1.0000 | Freq: Once | ORAL | 0 refills | Status: AC

## 2018-10-31 NOTE — Telephone Encounter (Signed)
[facility] Pre-operative Risk Assessment     Request for surgical clearance:     Endoscopy Procedure  What type of surgery is being performed?     Colonoscopy/Endoscopy  When is this surgery scheduled?     11/23/2018  What type of clearance is required ?   Pharmacy  Are there any medications that need to be held prior to surgery and how long? Eliquis - 2 days  Practice name and name of physician performing surgery?      [facility]  What is your office phone and fax number?      Phone- [phone]  Fax340 [phone]  Anesthesia type (None, local, MAC, general) ?       MAC

## 2018-10-31 NOTE — Patient Instructions (Signed)

## 2018-10-31 NOTE — Progress Notes (Signed)
HISTORY OF PRESENT ILLNESS:  Frances Smith is a 72 y.o. female, retired [employer] scientist, who presents today with complaints of constant diarrhea, significant rectal bleeding, and chronic GERD requiring maintenance medical therapy.  She has a history of atrial fibrillation for which she is on Eliquis.  Other medical problems as listed below.  Patient does have a history of adenomatous colon polyps on colonoscopy October 2010.  Her last surveillance examination was performed September 24, 2014.  Examination was normal except for mild diverticulosis and internal hemorrhoids.  Follow-up in 10 years recommended.  Patient tells me that she had 1 normal bowel movement daily until approximately 1 year ago.  Since that time she reports at least 3 bowel movements per day at least 2 of which are loose.  There is associated urgency.  No abdominal pain or weight loss.  She has had some weight gain.  She denies new medications or using sugar substitutes.  Symptoms may be brought on with meals.  No nocturnal symptoms.  She does have bloating.  Next, she reports chronic GERD symptoms requiring acid suppressive therapy.  Currently twice daily Pepcid.  In addition to chronic classic symptoms she was having issues with hoarseness.  This has improved.  No esophageal dysphasia.  She has never had upper endoscopy and is concerned.  Finally, she reports intermittent rectal bleeding for some time.  However, in October and November of this year bleeding was quite severe.  Specifically large volume clots noted with defecation.  No localized rectal pain..  Review of outside blood work shows last CBC with hemoglobin 14.1.  Normal renal function.  No relevant abnormalities upon review of her x-ray file.  REVIEW OF SYSTEMS:  All non-GI ROS negative unless otherwise stated in the HPI except for anxiety, arthritis, back pain, cough, depression, irregular heart rate, muscle cramps, sleeping problems, swelling of the right lower extremity, urinary  leakage, voice change  Past Medical History:  Diagnosis Date  . Anal fissure   . Anxiety   . Arthritis   . Atrial fibrillation ([place])   . Colon polyps   . Depression   . Diverticulosis   . Dysrhythmia   . GERD (gastroesophageal reflux disease)   . H/O: hysterectomy   . Heart murmur   . History of bronchitis as a child   . History of head injury   . History of measles   . History of mumps   . Hyperlipidemia   . IBS (irritable bowel syndrome)   . Menopause   . OSA on CPAP   . Osteopenia   . Paroxysmal atrial fibrillation ([facility])   . Restless leg syndrome   . Thyroid disease     Past Surgical History:  Procedure Laterality Date  . BREAST CYST INCISION AND DRAINAGE Left 1985  . BREAST SURGERY     aspiration of cyst / left   . COLONOSCOPY  2010  . ENDOMETRIAL ABLATION    . PARTIAL KNEE ARTHROPLASTY Left 08/10/2016   Procedure: LEFT KNEE UNICOMPARTMENTAL ARTHROPLASTY;  Surgeon: Gaynelle Arabian, MD;  Location: [facility];  Service: Orthopedics;  Laterality: Left;  . TONSILLECTOMY AND ADENOIDECTOMY  1949  . TREATMENT FISTULA ANAL  1988  . [place]  reports that she quit smoking about 53 years ago. Her smoking use included cigarettes. She has a 2.50 pack-year smoking history. She has never used smokeless tobacco. She reports that she does not drink alcohol or use drugs.  family  history includes Allergies in her mother; Anxiety disorder in her brother, daughter, and son; Asthma in her mother; Atrial fibrillation in her father; Breast cancer in her mother; Colon cancer (age of onset: 48) in her cousin; Colon cancer (age of onset: 52) in her maternal aunt; Depression in her daughter and son; Heart failure (age of onset: 73) in her father; Thyroid disease in her brother.  Allergies  Allergen Reactions  . Codeine Itching, Rash and Hives       PHYSICAL EXAMINATION: Vital signs: BP 140/80 (BP Location: Left Arm, Patient Position:  Sitting, Cuff Size: Normal)   Pulse 72   Ht 5' 1.52" (1.563 m) Comment: height measured without shoes  Wt 198 lb 8 oz (90 kg)   BMI 36.88 kg/m   Constitutional: generally well-appearing, no acute distress Psychiatric: alert and oriented x3, cooperative Eyes: extraocular movements intact, anicteric, conjunctiva pink Mouth: oral pharynx moist, no lesions Neck: supple no lymphadenopathy Cardiovascular: heart regular rate and rhythm, no murmur Lungs: clear to auscultation bilaterally Abdomen: soft, obese, nontender, nondistended, no obvious ascites, no peritoneal signs, normal bowel sounds, no organomegaly Rectal: Deferred until colonoscopy Extremities: no clubbing, cyanosis, or lower extremity edema bilaterally Skin: no lesions on visible extremities Neuro: No focal deficits.  Cranial nerves intact  ASSESSMENT:  1.  1 year history of loose bowels.  New.  Etiology unclear.  Considerations include microscopic colitis, celiac disease, bacterial overgrowth, bile salt related diarrhea. 2.  Significant rectal bleeding as described.  On Eliquis.  Suspect internal hemorrhoids.  Rule out interval neoplasia 3.  History of adenomatous colon polyps 2010.  Negative exam 2015 4.  Chronic GERD.  Requires acid suppressive therapy for management.  She has not had endoscopy.  Rule out Barrett's. 5.  History of atrial fibrillation.  Paroxysmal.  Currently in sinus rhythm.  On Eliquis.  Sees Dr. Rayann Heman   PLAN:  1.  Schedule colonoscopy with biopsies to evaluate chronic diarrhea and rectal bleeding.  The patient is HIGH RISK given her comorbidities and the need to address chronic anticoagulation therapy.The nature of the procedure, as well as the risks, benefits, and alternatives were carefully and thoroughly reviewed with the patient. Ample time for discussion and questions allowed. The patient understood, was satisfied, and agreed to proceed. 2.  Upper endoscopy to evaluate chronic GERD.  Patient is high  risk as above.The nature of the procedure, as well as the risks, benefits, and alternatives were carefully and thoroughly reviewed with the patient. Ample time for discussion and questions allowed. The patient understood, was satisfied, and agreed to proceed. 3.  Reflux precautions 4.  Continue Pepcid 5.  Screen for celiac disease with tissue transglutaminase antibody IgA and serum IgA level 6.  After the above work-up consider therapy such as fiber, bile salt resin binder, Xifaxan 7.  Hold Eliquis 2 days prior to the procedure if okay with Dr. Rayann Heman.

## 2018-11-01 LAB — TISSUE TRANSGLUTAMINASE, IGA: (tTG) Ab, IgA: 1 U/mL

## 2018-11-02 NOTE — Telephone Encounter (Signed)
Patient with diagnosis of Afib on Eliquis for anticoagulation.    Procedure: colonoscopy Date of procedure: 11/23/17  CHADS2-VASc score of  3 (CHF, HTN, AGE, DM2, stroke/tia x 2, CAD, AGE, female)  CrCl 30ml/min  Per office protocol, patient can hold Eliquis for 1-2 days prior to procedure.

## 2018-11-02 NOTE — Telephone Encounter (Signed)
Pharm please address eliquis 

## 2018-11-02 NOTE — Telephone Encounter (Signed)
Left voicemail for patient to call back. 

## 2018-11-07 NOTE — Telephone Encounter (Signed)
Spoke with patient and told that per the cardiologist she could hold her Eliquis for 1-2 days prior to her procedure.  Patient agreed.

## 2018-11-23 ENCOUNTER — Ambulatory Visit (AMBULATORY_SURGERY_CENTER): Payer: Medicare Other | Admitting: Internal Medicine

## 2018-11-23 ENCOUNTER — Encounter: Payer: Self-pay | Admitting: Internal Medicine

## 2018-11-23 VITALS — BP 103/48 | HR 73 | Temp 99.3°F | Resp 14 | Ht 62.4 in | Wt 198.0 lb

## 2018-11-23 DIAGNOSIS — D123 Benign neoplasm of transverse colon: Secondary | ICD-10-CM | POA: Diagnosis not present

## 2018-11-23 DIAGNOSIS — K219 Gastro-esophageal reflux disease without esophagitis: Secondary | ICD-10-CM | POA: Diagnosis not present

## 2018-11-23 DIAGNOSIS — K625 Hemorrhage of anus and rectum: Secondary | ICD-10-CM

## 2018-11-23 DIAGNOSIS — K529 Noninfective gastroenteritis and colitis, unspecified: Secondary | ICD-10-CM

## 2018-11-23 DIAGNOSIS — Z86010 Personal history of colonic polyps: Secondary | ICD-10-CM

## 2018-11-23 DIAGNOSIS — K5730 Diverticulosis of large intestine without perforation or abscess without bleeding: Secondary | ICD-10-CM

## 2018-11-23 DIAGNOSIS — R197 Diarrhea, unspecified: Secondary | ICD-10-CM

## 2018-11-23 MED ORDER — OMEPRAZOLE 40 MG PO CPDR: 40.0000 mg | DELAYED_RELEASE_CAPSULE | Freq: Every day | ORAL | 3 refills | Status: DC

## 2018-11-23 MED ORDER — SODIUM CHLORIDE 0.9 % IV SOLN: 500.0000 mL | Freq: Once | INTRAVENOUS | Status: DC

## 2018-11-23 NOTE — Patient Instructions (Signed)
Discharge instructions given. Resume Eliquis today. Prescription sent to pharmacy. Office will call and schedule with Dr. Henrene Pastor follow up appointment in 4 to 6 weeks. YOU HAD AN ENDOSCOPIC PROCEDURE TODAY AT [facility]:   Refer to the procedure report that was given to you for any specific questions about what was found during the examination.  If the procedure report does not answer your questions, please call your gastroenterologist to clarify.  If you requested that your care partner not be given the details of your procedure findings, then the procedure report has been included in a sealed envelope for you to review at your convenience later.  YOU SHOULD EXPECT: Some feelings of bloating in the abdomen. Passage of more gas than usual.  Walking can help get rid of the air that was put into your GI tract during the procedure and reduce the bloating. If you had a lower endoscopy (such as a colonoscopy or flexible sigmoidoscopy) you may notice spotting of blood in your stool or on the toilet paper. If you underwent a bowel prep for your procedure, you may not have a normal bowel movement for a few days.  Please Note:  You might notice some irritation and congestion in your nose or some drainage.  This is from the oxygen used during your procedure.  There is no need for concern and it should clear up in a day or so.  SYMPTOMS TO REPORT IMMEDIATELY:   Following lower endoscopy (colonoscopy or flexible sigmoidoscopy):  Excessive amounts of blood in the stool  Significant tenderness or worsening of abdominal pains  Swelling of the abdomen that is new, acute  Fever of 100F or higher   Following upper endoscopy (EGD)  Vomiting of blood or coffee ground material  New chest pain or pain under the shoulder blades  Painful or persistently difficult swallowing  New shortness of breath  Fever of 100F or higher  Black, tarry-looking stools  For urgent or emergent issues, a  gastroenterologist can be reached at any hour by calling [phone].   DIET:  We do recommend a small meal at first, but then you may proceed to your regular diet.  Drink plenty of fluids but you should avoid alcoholic beverages for 24 hours.  ACTIVITY:  You should plan to take it easy for the rest of today and you should NOT DRIVE or use heavy machinery until tomorrow (because of the sedation medicines used during the test).    FOLLOW UP: Our staff will call the number listed on your records the next business day following your procedure to check on you and address any questions or concerns that you may have regarding the information given to you following your procedure. If we do not reach you, we will leave a message.  However, if you are feeling well and you are not experiencing any problems, there is no need to return our call.  We will assume that you have returned to your regular daily activities without incident.  If any biopsies were taken you will be contacted by phone or by letter within the next 1-3 weeks.  Please call us at [phone] if you have not heard about the biopsies in 3 weeks.    SIGNATURES/CONFIDENTIALITY: You and/or your care partner have signed paperwork which will be entered into your electronic medical record.  These signatures attest to the fact that that the information above on your After Visit Summary has been reviewed and is understood.  Full  responsibility of the confidentiality of this discharge information lies with you and/or your care-partner.

## 2018-11-23 NOTE — Progress Notes (Signed)
Report to PACU, RN, vss, BBS= Clear.  

## 2018-11-23 NOTE — Op Note (Signed)
[facility] Patient Name: Frances Smith Procedure Date: 11/23/2018 1:35 PM MRN: [id number] Endoscopist: Docia Chuck. Henrene Pastor , MD Age: 73 Referring MD:  Date of Birth: 05-26-1946 Gender: Female Account #: [id number] Procedure:                Upper GI endoscopy Indications:              Esophageal reflux Medicines:                Monitored Anesthesia Care Procedure:                Pre-Anesthesia Assessment:                           - Prior to the procedure, a History and Physical                            was performed, and patient medications and                            allergies were reviewed. The patient's tolerance of                            previous anesthesia was also reviewed. The risks                            and benefits of the procedure and the sedation                            options and risks were discussed with the patient.                            All questions were answered, and informed consent                            was obtained. Prior Anticoagulants: The patient has                            taken Eliquis (apixaban), last dose was 3 days                            prior to procedure. ASA Grade Assessment: III - A                            patient with severe systemic disease. After                            reviewing the risks and benefits, the patient was                            deemed in satisfactory condition to undergo the                            procedure.  After obtaining informed consent, the endoscope was                            passed under direct vision. Throughout the                            procedure, the patient's blood pressure, pulse, and                            oxygen saturations were monitored continuously. The                            Endoscope was introduced through the mouth, and                            advanced to the second part of duodenum. The upper   GI endoscopy was accomplished without difficulty.                            The patient tolerated the procedure well. Scope In: Scope Out: Findings:                 Esophagitis was found in the distal esophagus. This                            was manifested by erythema, edema, and friability.                            The etiology was reflux.                           The stomach was normal.                           The examined duodenum was normal.                           The cardia and gastric fundus were normal on                            retroflexion. Complications:            No immediate complications. Estimated Blood Loss:     Estimated blood loss: none. Impression:               1. Reflux esophagitis                           2. Otherwise unremarkable EGD. Recommendation:           - Patient has a contact number available for                            emergencies. The signs and symptoms of potential                            delayed complications were discussed with the  patient. Return to normal activities tomorrow.                            Written discharge instructions were provided to the                            patient.                           - Resume previous diet.                           - Continue present medications.                           - Resume Eliquis (apixaban) at prior dose today.                           - Prescribe omeprazole 40 mg daily; #30; 11                            refills. STOP PEPCID                           - Office follow-up with Dr. Henrene Pastor in 4 to 6 weeks Docia Chuck. Henrene Pastor, MD 11/23/2018 2:21:10 PM This report has been signed electronically.

## 2018-11-23 NOTE — Progress Notes (Signed)
Called to room to assist during endoscopic procedure.  Patient ID and intended procedure confirmed with present staff. Received instructions for my participation in the procedure from the performing physician.  

## 2018-11-23 NOTE — Op Note (Signed)
[facility] Patient Name: Frances Smith Procedure Date: 11/23/2018 1:36 PM MRN: [id number] Endoscopist: Docia Chuck. Henrene Pastor , MD Age: 73 Referring MD:  Date of Birth: 1946-09-19 Gender: Female Account #: [id number] Procedure:                Colonoscopy cold snare polypectomy x 1; with                            biopsies Indications:              Clinically significant diarrhea of unexplained                            origin, Rectal bleeding. Also history of                            nonadvanced adenoma 2010. Negative examination 2015 Medicines:                Monitored Anesthesia Care Procedure:                Pre-Anesthesia Assessment:                           - Prior to the procedure, a History and Physical                            was performed, and patient medications and                            allergies were reviewed. The patient's tolerance of                            previous anesthesia was also reviewed. The risks                            and benefits of the procedure and the sedation                            options and risks were discussed with the patient.                            All questions were answered, and informed consent                            was obtained. Prior Anticoagulants: The patient has                            taken Eliquis (apixaban), last dose was 3 days                            prior to procedure. ASA Grade Assessment: III - A                            patient with severe systemic disease. After  reviewing the risks and benefits, the patient was                            deemed in satisfactory condition to undergo the                            procedure.                           After obtaining informed consent, the colonoscope                            was passed under direct vision. Throughout the                            procedure, the patient's blood pressure, pulse, and          oxygen saturations were monitored continuously. The                            Colonoscope was introduced through the anus and                            advanced to the the cecum, identified by                            appendiceal orifice and ileocecal valve. The                            terminal ileum, ileocecal valve, appendiceal                            orifice, and rectum were photographed. The quality                            of the bowel preparation was excellent. The                            colonoscopy was performed without difficulty. The                            patient tolerated the procedure well. The bowel                            preparation used was SUPREP. Scope In: 1:50:19 PM Scope Out: 2:04:53 PM Scope Withdrawal Time: 0 hours 12 minutes 39 seconds  Total Procedure Duration: 0 hours 14 minutes 34 seconds  Findings:                 The terminal ileum appeared normal.                           A 3 mm polyp was found in the transverse colon. The                            polyp was  removed with a cold snare. Resection and                            retrieval were complete.                           Multiple diverticula were found in the sigmoid                            colon.                           The right colon revealed patchy erythema and a few                            scattered superficial erosions. This was biopsied                            and submitted in container 1. The entire examined                            colon otherwise appeared normal on direct and                            retroflexion views. Biopsies for histology were                            taken with a cold forceps from the transverse and                            left colon for evaluation of microscopic colitis. Complications:            No immediate complications. Estimated blood loss:                            None. Estimated Blood Loss:     Estimated blood  loss: none. Impression:               - The examined portion of the ileum was normal.                           - One 3 mm polyp in the transverse colon, removed                            with a cold snare. Resected and retrieved.                           - Diverticulosis in the sigmoid colon.                           - Patchy erythema and superficial erosions in the                            right colon. Recommendation:           1. Follow-up biopsies. We  will contact you with the                            results                           2. Begin Metamucil 1 to 2 tablespoons daily to see                            if this improves bowel consistency                           3. Resume Eliquis today                           4. Upper endoscopy today. Please see report                           5. Office follow-up with Dr. Henrene Pastor in 4 to 6 weeks Docia Chuck. Henrene Pastor, MD 11/23/2018 2:17:56 PM This report has been signed electronically.

## 2018-11-23 NOTE — Progress Notes (Signed)
Pt's states no medical or surgical changes since previsit or office visit. 

## 2018-11-26 ENCOUNTER — Telehealth: Payer: Self-pay

## 2018-11-26 ENCOUNTER — Other Ambulatory Visit: Payer: Self-pay | Admitting: Nurse Practitioner

## 2018-11-26 ENCOUNTER — Other Ambulatory Visit: Payer: Self-pay | Admitting: Family Medicine

## 2018-11-26 DIAGNOSIS — Z1231 Encounter for screening mammogram for malignant neoplasm of breast: Secondary | ICD-10-CM

## 2018-11-26 NOTE — Telephone Encounter (Signed)
  Follow up Call-  Call back number 11/23/2018  Post procedure Call Back phone  # [phone]  Permission to leave phone message Yes  Some recent data might be hidden     Patient questions:  Do you have a fever, pain , or abdominal swelling? No. Pain Score  0 *  Have you tolerated food without any problems? Yes.  Have you been able to return to your normal activities? Yes.    Do you have any questions about your discharge instructions: Diet   No. Medications  No. Follow up visit  No.  Do you have questions or concerns about your Care? No.  Actions: * If pain score is 4 or above: No action needed, pain <4.

## 2018-12-05 ENCOUNTER — Encounter: Payer: Self-pay | Admitting: Internal Medicine

## 2018-12-06 ENCOUNTER — Other Ambulatory Visit: Payer: Self-pay

## 2018-12-06 MED ORDER — BUDESONIDE 3 MG PO CPEP: 9.0000 mg | ORAL_CAPSULE | Freq: Every day | ORAL | 3 refills | Status: DC

## 2018-12-24 ENCOUNTER — Ambulatory Visit
Admission: RE | Admit: 2018-12-24 | Discharge: 2018-12-24 | Disposition: A | Discharge status: Home / Self Care | Payer: Medicare Other | Source: Ambulatory Visit | Attending: Nurse Practitioner | Admitting: Nurse Practitioner

## 2018-12-24 DIAGNOSIS — Z1231 Encounter for screening mammogram for malignant neoplasm of breast: Secondary | ICD-10-CM

## 2018-12-27 ENCOUNTER — Ambulatory Visit: Payer: Self-pay | Admitting: Internal Medicine

## 2019-01-10 ENCOUNTER — Ambulatory Visit (INDEPENDENT_AMBULATORY_CARE_PROVIDER_SITE_OTHER): Payer: Medicare Other | Admitting: Internal Medicine

## 2019-01-10 ENCOUNTER — Encounter: Payer: Self-pay | Admitting: Internal Medicine

## 2019-01-10 VITALS — BP 132/72 | HR 74 | Ht 62.0 in | Wt 203.0 lb

## 2019-01-10 DIAGNOSIS — R197 Diarrhea, unspecified: Secondary | ICD-10-CM | POA: Diagnosis not present

## 2019-01-10 DIAGNOSIS — A09 Infectious gastroenteritis and colitis, unspecified: Secondary | ICD-10-CM | POA: Diagnosis not present

## 2019-01-10 DIAGNOSIS — K219 Gastro-esophageal reflux disease without esophagitis: Secondary | ICD-10-CM | POA: Diagnosis not present

## 2019-01-10 NOTE — Progress Notes (Signed)
HISTORY OF PRESENT ILLNESS:  Frances Smith is a 73 y.o. female who was evaluated in the office October 31, 2018 regarding constant diarrhea, significant rectal bleeding, and chronic GERD requiring maintenance therapy.  The problems with diarrhea were new and had been occurring for at least 1 year.  She subsequently underwent colonoscopy and upper endoscopy November 23, 2018.  Colonoscopy revealed mild but definite colitis in the right colon.  Biopsies revealed active colitis with a broad differential.  She also had a tubular adenoma which was removed.  Biopsies of the more normal-appearing left colon mucosa were normal.  For these changes she was placed on budesonide 9 mg daily.  Upper endoscopy revealed esophagitis.  She was changed from Pepcid to omeprazole 40 mg daily.  Follow-up at this time requested.  The patient continues on budesonide 9 mg daily.  She is pleased to report that she has had dramatic improvement in her bowel habits.  Previously 3 or more soft or loose stools with urgency per day.  Currently 0-1 formed bowel movement per day.  No urgency.  She is tolerating the medication well.  Next, she continues with occasional breakthrough reflux symptoms though less so than when on no medication or H2 receptor antagonist therapy.  No dysphasia.  GI review of systems is otherwise negative except for bloating.  REVIEW OF SYSTEMS:  All non-GI ROS negative unless otherwise stated in the HPI except for arthritis, back pain, headaches, heart rhythm change, cough, urinary leakage  Past Medical History:  Diagnosis Date  . Anal fissure   . Anxiety   . Arthritis   . Atrial fibrillation ([place])   . Colon polyps   . Depression   . Diverticulosis   . Dysrhythmia   . GERD (gastroesophageal reflux disease)   . H/O: hysterectomy   . Heart murmur   . History of bronchitis as a child   . History of head injury   . History of measles   . History of mumps   . Hyperlipidemia   . IBS (irritable bowel  syndrome)   . Menopause   . OSA on CPAP   . Osteopenia   . Paroxysmal atrial fibrillation (HCC)   . Restless leg syndrome   . Thyroid disease     Past Surgical History:  Procedure Laterality Date  . BREAST CYST INCISION AND DRAINAGE Left 1985  . BREAST SURGERY     aspiration of cyst / left   . COLONOSCOPY  2010  . ENDOMETRIAL ABLATION    . PARTIAL KNEE ARTHROPLASTY Left 08/10/2016   Procedure: LEFT KNEE UNICOMPARTMENTAL ARTHROPLASTY;  Surgeon: Gaynelle Arabian, MD;  Location: [facility];  Service: Orthopedics;  Laterality: Left;  . TONSILLECTOMY AND ADENOIDECTOMY  1949  . TREATMENT FISTULA ANAL  1988  . [place]  reports that she quit smoking about 54 years ago. Her smoking use included cigarettes. She has a 2.50 pack-year smoking history. She has never used smokeless tobacco. She reports that she does not drink alcohol or use drugs.  family history includes Allergies in her mother; Anxiety disorder in her brother, daughter, and son; Asthma in her mother; Atrial fibrillation in her father; Breast cancer in her mother; Colon cancer (age of onset: 77) in her cousin; Colon cancer (age of onset: 80) in her maternal aunt; Depression in her daughter and son; Heart failure (age of onset: 72) in her father; Thyroid disease in her brother.  Allergies  Allergen  Reactions  . Codeine Itching, Rash and Hives       PHYSICAL EXAMINATION: Vital signs: BP 132/72   Pulse 74   Ht 5\' 2"  (1.575 m)   Wt 203 lb (92.1 kg)   BMI 37.13 kg/m   Constitutional: generally well-appearing, no acute distress Psychiatric: alert and oriented x3, cooperative Eyes: extraocular movements intact, anicteric, conjunctiva pink Mouth: oral pharynx moist, no lesions Neck: supple no lymphadenopathy Cardiovascular: heart regular rate and rhythm, no murmur Lungs: clear to auscultation bilaterally Abdomen: soft, nontender, nondistended, no obvious ascites, no peritoneal  signs, normal bowel sounds, no organomegaly Rectal: Omitted Extremities: no clubbing, cyanosis, or lower extremity edema bilaterally Skin: no lesions on visible extremities Neuro: No focal deficits.  Cranial nerves intact  ASSESSMENT:  1.  Right sided colitis with associated chronic diarrhea which has responded to budesonide therapy.  Suspect mild early IBD. 2.  GERD.  Esophagitis on endoscopy.  Now on PPI.  Improved but still with some breakthrough symptoms which she claims are tolerable 3.  History of adenomatous colon polyps   PLAN:  1.  Continue budesonide 9 mg daily until the end of this month, then 6 mg daily the following month, then 3 mg daily in April, then stop in May.  Contact the office in the interim should she have recurrent diarrhea, problems, or questions. 2.  Reflux precautions 3.  Continue omeprazole 4.  Antacids for breakthrough symptoms 5.  Weight loss in addition to other reflux precautions 6.  Routine GI follow-up in 3 months 7.  Surveillance colonoscopy in 5 years 8.  Ongoing general medical care with Dr. Melford Aase  25-minute spent face-to-face with the patient.  Greater than 50% the time used for counseling regarding her above listed GI issues and the recommendations as outlined

## 2019-01-10 NOTE — Patient Instructions (Addendum)
If you are age 75 or older, your body mass index should be between 23-30. Your Body mass index is 37.13 kg/m. If this is out of the aforementioned range listed, please consider follow up with your Primary Care Provider.  Continue taking Budesonide as instructed by Dr. Henrene Pastor:  9mg  Feb, 6mg  in March and 3mg  in April  Follow up with Dr. Henrene Pastor in May. Please call the office back the first part of April for this appointment  Thank you.

## 2019-01-23 ENCOUNTER — Encounter: Payer: Self-pay | Admitting: Internal Medicine

## 2019-01-23 ENCOUNTER — Ambulatory Visit (INDEPENDENT_AMBULATORY_CARE_PROVIDER_SITE_OTHER): Payer: Medicare Other | Admitting: Internal Medicine

## 2019-01-23 VITALS — BP 116/74 | HR 72 | Ht 62.0 in | Wt 200.8 lb

## 2019-01-23 DIAGNOSIS — G4733 Obstructive sleep apnea (adult) (pediatric): Secondary | ICD-10-CM | POA: Diagnosis not present

## 2019-01-23 DIAGNOSIS — I480 Paroxysmal atrial fibrillation: Secondary | ICD-10-CM | POA: Diagnosis not present

## 2019-01-23 DIAGNOSIS — I10 Essential (primary) hypertension: Secondary | ICD-10-CM

## 2019-01-23 NOTE — Patient Instructions (Addendum)
Medication Instructions:  Your physician recommends that you continue on your current medications as directed. Please refer to the Current Medication list given to you today.  Labwork: None ordered.  Testing/Procedures:  If you decide to go ahead with a loop recorder implant give me a call-- Bing Neighbors [phone]  Follow-Up: Your physician wants you to follow-up in: 3 months with Dr. Rayann Heman.      Any Other Special Instructions Will Be Listed Below (If Applicable).  If you need a refill on your cardiac medications before your next appointment, please call your pharmacy.

## 2019-01-23 NOTE — Progress Notes (Signed)
PCP: Chesley Noon, MD   Primary EP: Dr Marcelina Morel is a 73 y.o. female who presents today for routine electrophysiology followup.  Since last being seen in our clinic, the patient reports doing very well.  She does have some palpitations but is not certain that they are due to afib.  Today, she denies symptoms of chest pain, shortness of breath,  lower extremity edema, dizziness, presyncope, or syncope.  The patient is otherwise without complaint today.   Past Medical History:  Diagnosis Date  . Anal fissure   . Anxiety   . Arthritis   . Atrial fibrillation ([place])   . Colon polyps   . Depression   . Diverticulosis   . Dysrhythmia   . GERD (gastroesophageal reflux disease)   . H/O: hysterectomy   . Heart murmur   . History of bronchitis as a child   . History of head injury   . History of measles   . History of mumps   . Hyperlipidemia   . IBS (irritable bowel syndrome)   . Menopause   . OSA on CPAP   . Osteopenia   . Paroxysmal atrial fibrillation (H[facility])   . Restless leg syndrome   . Thyroid disease    Past Surgical History:  Procedure Laterality Date  . BREAST CYST INCISION AND DRAINAGE Left 1985  . BREAST SURGERY     aspiration of cyst / left   . COLONOSCOPY  2010  . ENDOMETRIAL ABLATION    . PARTIAL KNEE ARTHROPLASTY Left 08/10/2016   Procedure: LEFT KNEE UNICOMPARTMENTAL ARTHROPLASTY;  Surgeon: Gaynelle Arabian, MD;  Location: [facility];  Service: Orthopedics;  Laterality: Left;  . TONSILLECTOMY AND ADENOIDECTOMY  1949  . TREATMENT FISTULA ANAL  1988  . VAGINAL HYSTERECTOMY  1984    ROS- all systems are reviewed and negatives except as per HPI above  Current Outpatient Medications  Medication Sig Dispense Refill  . acetaminophen (TYLENOL) 500 MG tablet Take 500 mg by mouth 3 (three) times daily as needed (for pain.).    Marland Kitchen apixaban (ELIQUIS) 5 MG TABS tablet Take 1 tablet (5 mg total) by mouth 2 (two) times daily. 180 tablet 3  . b complex vitamins  tablet Take 1 tablet by mouth daily.    . budesonide (ENTOCORT EC) 3 MG 24 hr capsule Take 3 capsules (9 mg total) by mouth daily. 270 capsule 3  . celecoxib (CELEBREX) 200 MG capsule Take 200 mg by mouth daily.    . Cholecalciferol (D3-1000) 1000 units capsule Take 1,000 Units by mouth daily.    Marland Kitchen MAGNESIUM CHLORIDE-CALCIUM PO Take 1 tablet by mouth daily.    . Multiple Vitamins-Minerals (MULTIVITAMIN ADULT PO) Take by mouth daily.    Marland Kitchen omeprazole (PRILOSEC) 40 MG capsule Take 1 capsule (40 mg total) by mouth daily. 90 capsule 3  . OVER THE COUNTER MEDICATION Chinese Herbs, Great MenderTea Pills 8 tabs daily, Joint Obstruction Tea Pills 8 Tabs daily , Stomach Vigor 5 tabs daily    . pravastatin (PRAVACHOL) 40 MG tablet Take 40 mg by mouth daily.      . propafenone (RYTHMOL SR) 325 MG 12 hr capsule TAKE 1 CAPSULE BY MOUTH EVERY 12 HOURS 180 capsule 3  . rOPINIRole (REQUIP) 3 MG tablet Take 3 mg by mouth at bedtime.    . sertraline (ZOLOFT) 100 MG tablet Take 150 mg by mouth daily.     Marland Kitchen thyroid (ARMOUR) 30 MG tablet Take 30 mg by  mouth daily before breakfast.    . TURMERIC PO Take 1 tablet by mouth daily.     No current facility-administered medications for this visit.     Physical Exam: Vitals:   01/23/19 1056  BP: 116/74  Pulse: 72  SpO2: 95%  Weight: 200 lb 12.8 oz (91.1 kg)  Height: 5\' 2"  (1.575 m)    GEN- The patient is well appearing, alert and oriented x 3 today.   Head- normocephalic, atraumatic Eyes-  Sclera clear, conjunctiva pink Ears- hearing intact Oropharynx- clear Lungs- Clear to ausculation bilaterally, normal work of breathing Heart- Regular rate and rhythm, no murmurs, rubs or gallops, PMI not laterally displaced GI- soft, NT, ND, + BS Extremities- no clubbing, cyanosis, or edema  Wt Readings from Last 3 Encounters:  01/23/19 200 lb 12.8 oz (91.1 kg)  01/10/19 203 lb (92.1 kg)  11/23/18 198 lb (89.8 kg)    EKG tracing ordered today is personally reviewed  and shows sinus rhythm, normal ekg  Assessment and Plan:  1. Paroxysmal atrial fibrillation S/p ablation in [place] in 1999 Doing well with rhythmol We discussed long term monitoring with either an Apple Watch or ILR to better characterize her palpitations and for afib monitoring.  She will consider these options further and contact my office if she wishes to proceed with ILR.  2. HTN Stable No change required today  3. OSA Compliant with CPAP  Return to see me in 3 months  Thompson Grayer MD, [facility] 01/23/2019 11:23 AM

## 2019-01-24 ENCOUNTER — Ambulatory Visit: Payer: Self-pay | Admitting: Internal Medicine

## 2019-01-28 ENCOUNTER — Telehealth (HOSPITAL_COMMUNITY): Payer: Self-pay | Admitting: *Deleted

## 2019-01-28 NOTE — Telephone Encounter (Signed)
Pt called in stating since her visit last week with Dr. Rayann Heman she decided to purchase apple watch to record any episodes she may have. This weekend she had multiple episodes which she has faxed into our office for review. Will forward strips from apple watch to Dr. Rayann Heman to view - will await recommendations on next steps as pt is very anxious.

## 2019-01-29 MED ORDER — DILTIAZEM HCL 30 MG PO TABS: ORAL_TABLET | ORAL | 1 refills | Status: DC

## 2019-01-29 NOTE — Telephone Encounter (Signed)
Strips reviewed by Dr. Rayann Heman. Confirmed afib with RVR on apple watch strips. Recommendations per Dr. Rayann Heman were either possible change of medication from rythmol to different AAD such as flecainde, tikosyn, sotalol or amiodarone or repeat ablation. Also calling in cardizem 30mg  tabs to use as needed for afib episodes. Pt would like to think about her options and call back if she decides to change therapy or look at repeat ablation.

## 2019-05-05 IMAGING — MG DIGITAL SCREENING BILATERAL MAMMOGRAM WITH TOMO AND CAD
8 series · 8 of 24 positions shown · non-contrast
Comparison: Previous exam(s).

CLINICAL DATA: Screening.

EXAM:
DIGITAL SCREENING BILATERAL MAMMOGRAM WITH TOMO AND CAD

[R MLO synth-2D]
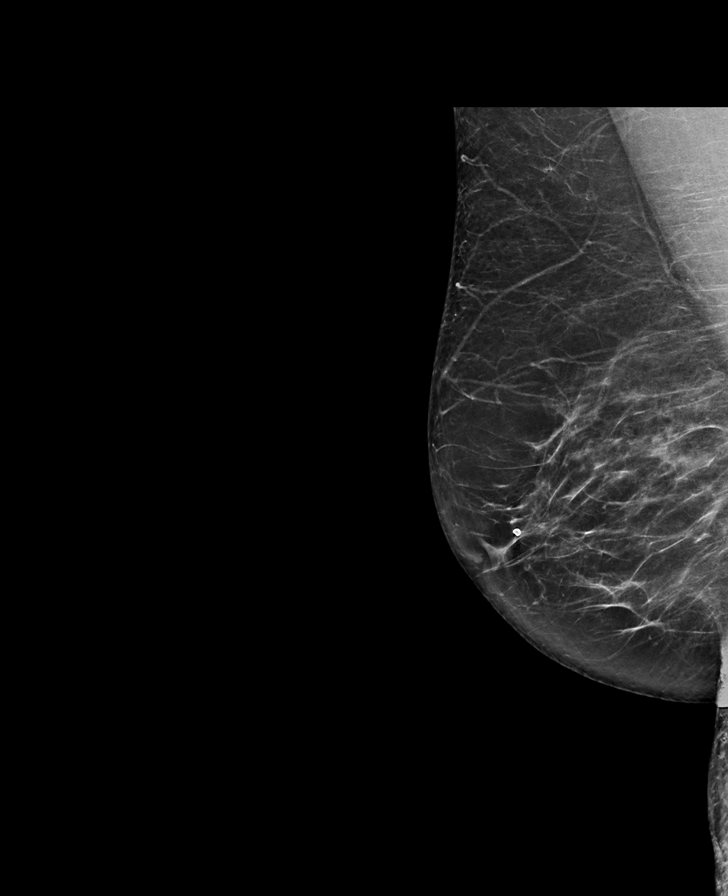

[L CC synth-2D]
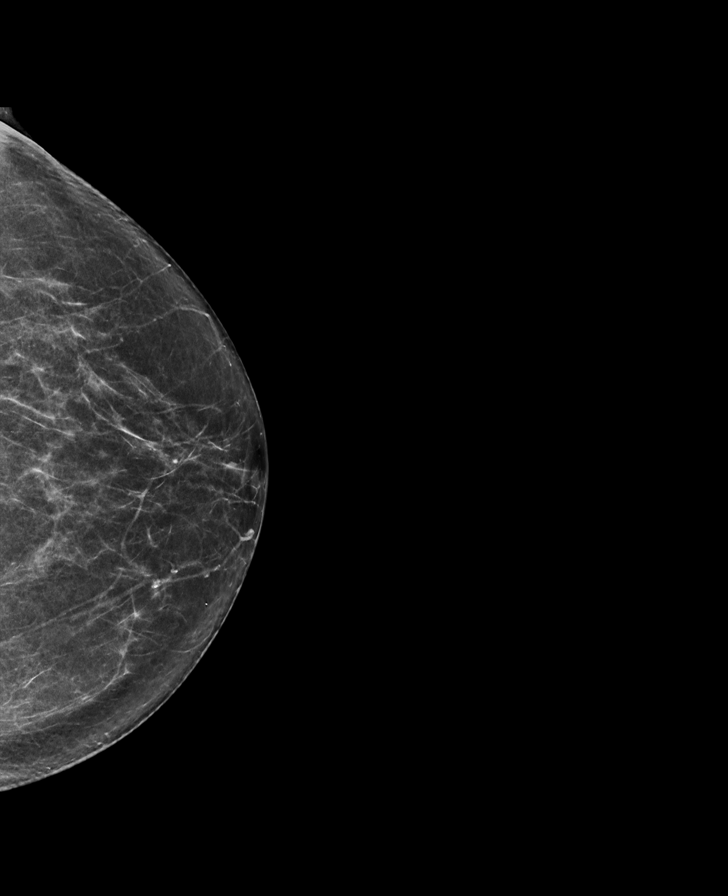

[R CC synth-2D]
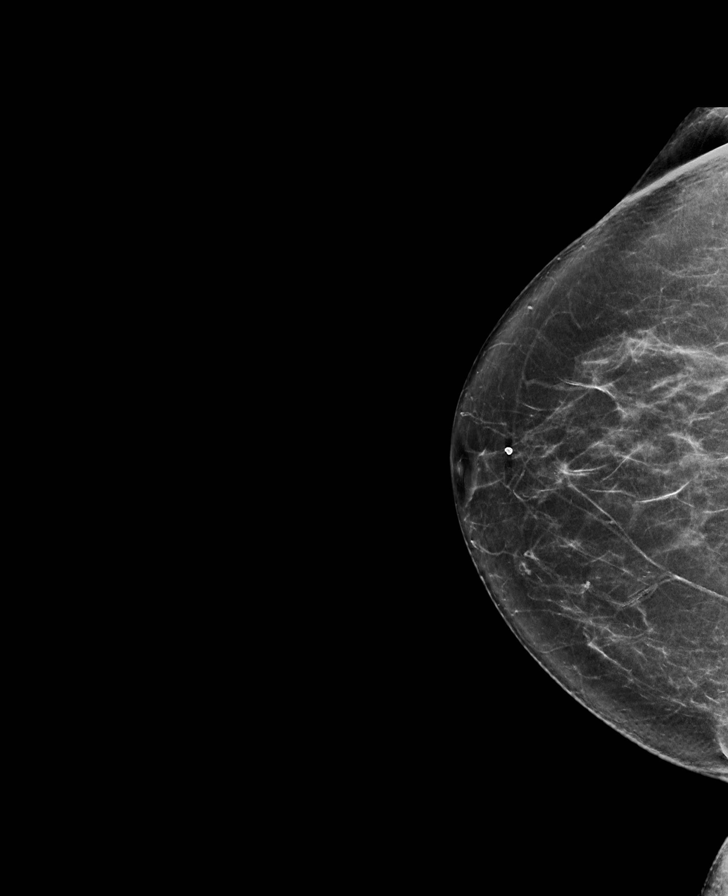

[L MLO synth-2D]
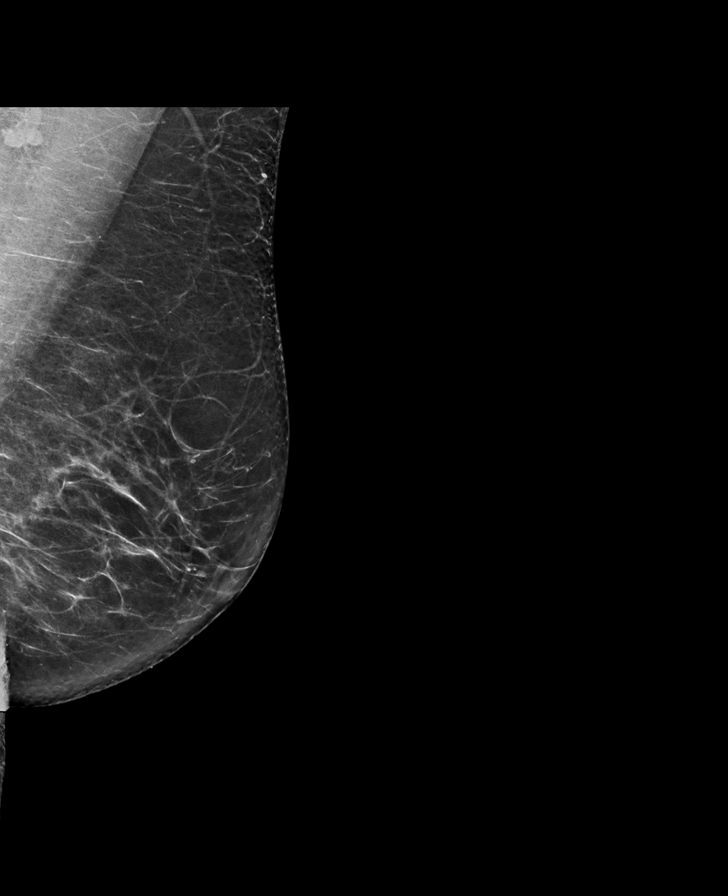

[R MLO tomo · tomo slice 39/76.0]
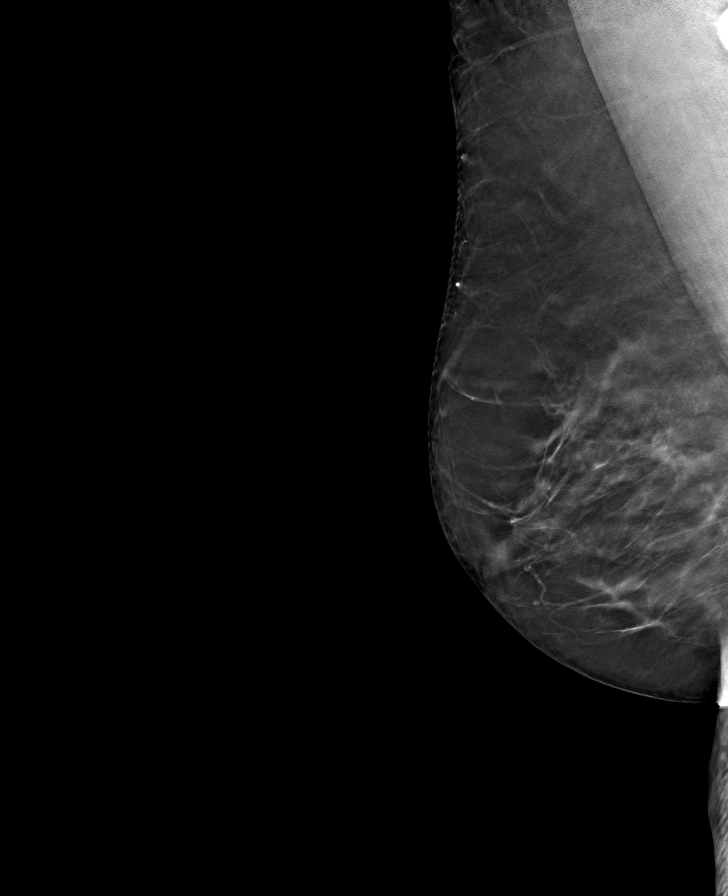

[R CC tomo · tomo slice 37/74.0]
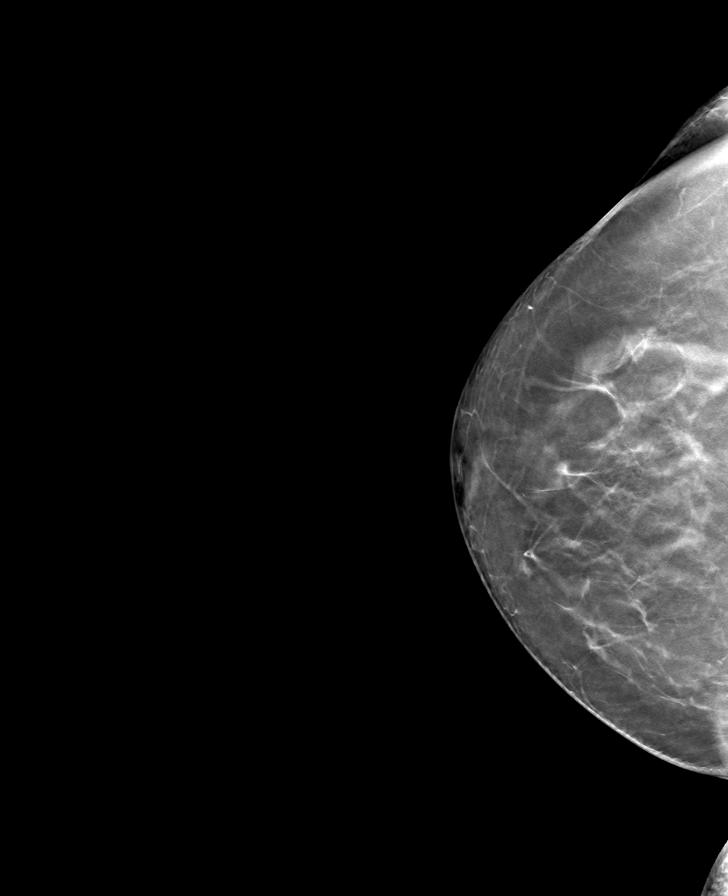

[L MLO tomo · tomo slice 39/78.0]
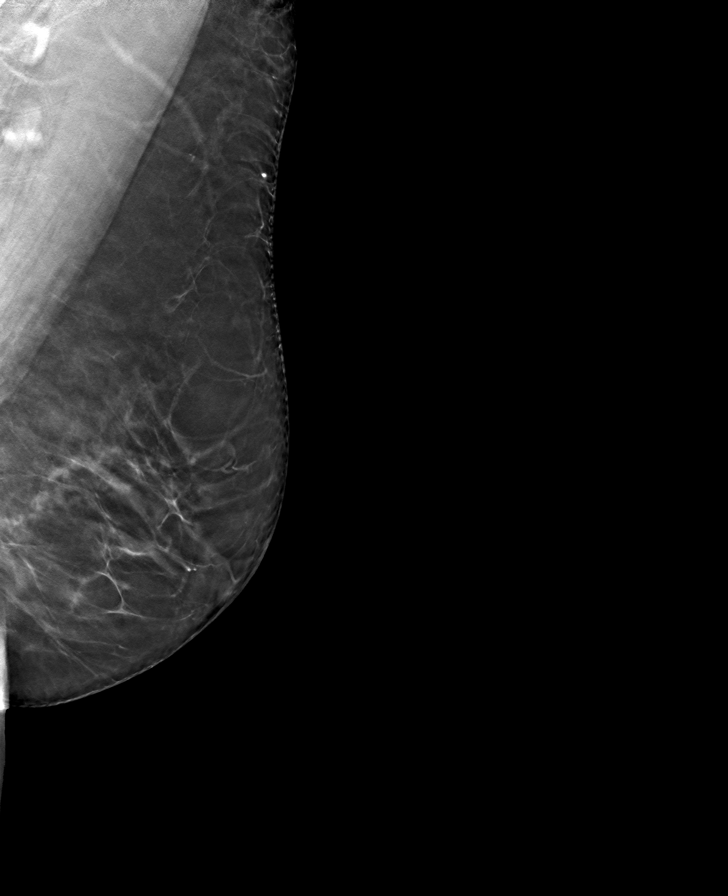

[L CC tomo · tomo slice 34/67.0]
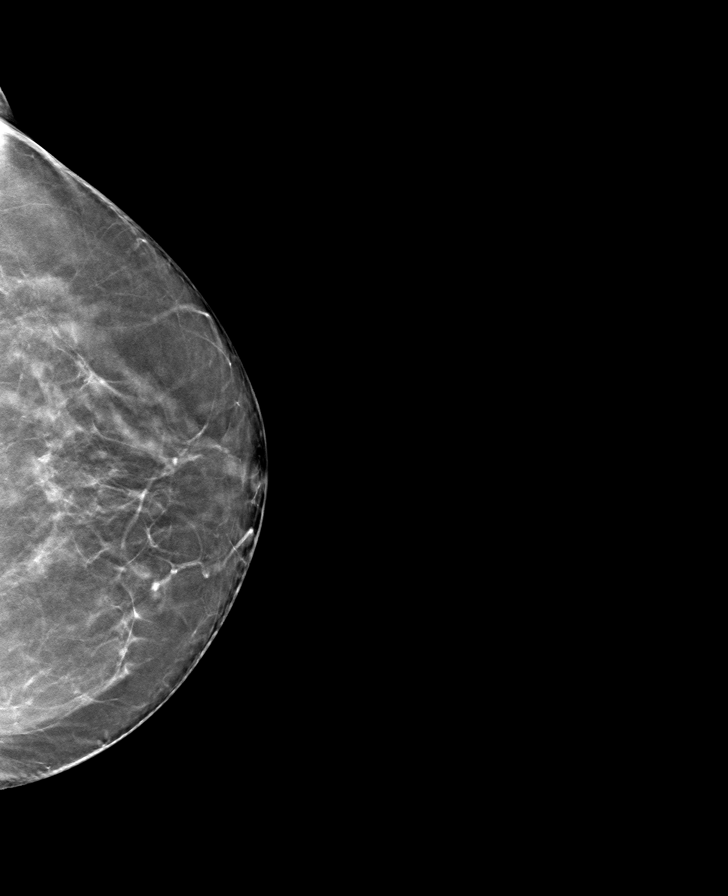

[8 of 24 positions shown; findings below may reference images not displayed]

ACR Breast Density Category b: There are scattered areas of
fibroglandular density.
FINDINGS: There are no findings suspicious for malignancy. Images were
processed with CAD.
IMPRESSION: No mammographic evidence of malignancy. A result letter of this
screening mammogram will be mailed directly to the patient.

RECOMMENDATION:
Screening mammogram in one year. (Code:[id number])

BI-RADS CATEGORY  1: Negative.

## 2019-05-09 ENCOUNTER — Telehealth (INDEPENDENT_AMBULATORY_CARE_PROVIDER_SITE_OTHER): Payer: Medicare Other | Admitting: Internal Medicine

## 2019-05-09 ENCOUNTER — Encounter: Payer: Self-pay | Admitting: Internal Medicine

## 2019-05-09 VITALS — BP 126/72 | HR 76 | Ht 62.0 in | Wt 197.0 lb

## 2019-05-09 DIAGNOSIS — R0789 Other chest pain: Secondary | ICD-10-CM | POA: Diagnosis not present

## 2019-05-09 DIAGNOSIS — G4733 Obstructive sleep apnea (adult) (pediatric): Secondary | ICD-10-CM

## 2019-05-09 DIAGNOSIS — I480 Paroxysmal atrial fibrillation: Secondary | ICD-10-CM

## 2019-05-09 NOTE — Progress Notes (Signed)
Electrophysiology TeleHealth Note   Due to national recommendations of social distancing due to [place] 19, an audio telehealth visit is felt to be most appropriate for this patient at this time.  Verbal consent was obtained by me for the telehealth visit today.  The patient does not have capability for a virtual visit.  A phone visit is therefore required today.   Date:  05/09/2019   ID:  Frances Smith, DOB October 18, 1946, MRN [id number]  Location: patient's home  Provider location:  [facility]  Evaluation Performed: Follow-up visit  PCP:  Chesley Noon, MD   Electrophysiologist:  Dr Rayann Heman  Chief Complaint:  palpitations  History of Present Illness:    Frances Smith is a 73 y.o. female who presents via telehealth conferencing today.  Since last being seen in our clinic, the patient reports doing very well.  She continues to have some episodes of afib.  Episodes happen every 2-3 days.  She reports having some chest discomfort with episodes. Today, she denies symptoms of exertional chest pain, shortness of breath,  lower extremity edema, dizziness, presyncope, or syncope.  The patient is otherwise without complaint today.  The patient denies symptoms of fevers, chills, cough, or new SOB worrisome for COVID 19.  Past Medical History:  Diagnosis Date  . Anal fissure   . Anxiety   . Arthritis   . Atrial fibrillation ([place])   . Colon polyps   . Depression   . Diverticulosis   . Dysrhythmia   . GERD (gastroesophageal reflux disease)   . H/O: hysterectomy   . Heart murmur   . History of bronchitis as a child   . History of head injury   . History of measles   . History of mumps   . Hyperlipidemia   . IBS (irritable bowel syndrome)   . Menopause   . OSA on CPAP   . Osteopenia   . Paroxysmal atrial fibrillation (H[facility])   . Restless leg syndrome   . Thyroid disease     Past Surgical History:  Procedure Laterality Date  . BREAST CYST INCISION AND DRAINAGE Left 1985  .  BREAST SURGERY     aspiration of cyst / left   . COLONOSCOPY  2010  . ENDOMETRIAL ABLATION    . PARTIAL KNEE ARTHROPLASTY Left 08/10/2016   Procedure: LEFT KNEE UNICOMPARTMENTAL ARTHROPLASTY;  Surgeon: Gaynelle Arabian, MD;  Location: [facility];  Service: Orthopedics;  Laterality: Left;  . TONSILLECTOMY AND ADENOIDECTOMY  1949  . TREATMENT FISTULA ANAL  1988  . VAGINAL HYSTERECTOMY  1984    Current Outpatient Medications  Medication Sig Dispense Refill  . acetaminophen (TYLENOL) 500 MG tablet Take 500 mg by mouth 3 (three) times daily as needed (for pain.).    Marland Kitchen apixaban (ELIQUIS) 5 MG TABS tablet Take 1 tablet (5 mg total) by mouth 2 (two) times daily. 180 tablet 3  . b complex vitamins tablet Take 1 tablet by mouth daily.    . budesonide (ENTOCORT EC) 3 MG 24 hr capsule Take 3 capsules (9 mg total) by mouth daily. 270 capsule 3  . celecoxib (CELEBREX) 200 MG capsule Take 200 mg by mouth daily.    . Cholecalciferol (D3-1000) 1000 units capsule Take 1,000 Units by mouth daily.    Marland Kitchen diltiazem (CARDIZEM) 30 MG tablet Take 1 tablet every 4 hours AS NEEDED for heart rate >100 as long as blood pressure >100. 60 tablet 1  . MAGNESIUM CHLORIDE-CALCIUM PO Take 1  tablet by mouth daily.    . Multiple Vitamins-Minerals (MULTIVITAMIN ADULT PO) Take by mouth daily.    Marland Kitchen omeprazole (PRILOSEC) 40 MG capsule Take 1 capsule (40 mg total) by mouth daily. 90 capsule 3  . pravastatin (PRAVACHOL) 40 MG tablet Take 40 mg by mouth daily.      . propafenone (RYTHMOL SR) 325 MG 12 hr capsule TAKE 1 CAPSULE BY MOUTH EVERY 12 HOURS 180 capsule 3  . rOPINIRole (REQUIP) 3 MG tablet Take 3 mg by mouth at bedtime.    . sertraline (ZOLOFT) 100 MG tablet Take 150 mg by mouth daily.     Marland Kitchen thyroid (ARMOUR) 30 MG tablet Take 30 mg by mouth daily before breakfast.     No current facility-administered medications for this visit.     Allergies:   Codeine   Social History:  The patient  reports that she quit smoking about 54  years ago. Her smoking use included cigarettes. She has a 2.50 pack-year smoking history. She has never used smokeless tobacco. She reports that she does not drink alcohol or use drugs.   Family History:  The patient's  family history includes Allergies in her mother; Anxiety disorder in her brother, daughter, and son; Asthma in her mother; Atrial fibrillation in her father; Breast cancer in her mother; Colon cancer (age of onset: 33) in her cousin; Colon cancer (age of onset: 17) in her maternal aunt; Depression in her daughter and son; Heart failure (age of onset: 36) in her father; Thyroid disease in her brother.   ROS:  Please see the history of present illness.   All other systems are personally reviewed and negative.    Exam:    Vital Signs:  BP 126/72   Pulse 76   Ht 5\' 2"  (1.575 m)   Wt 197 lb (89.4 kg)   BMI 36.03 kg/m   Well appearing, NAD, normal WOB   Labs/Other Tests and Data Reviewed:    Recent Labs: No results found for requested labs within last 8760 hours.   Wt Readings from Last 3 Encounters:  05/09/19 197 lb (89.4 kg)  01/23/19 200 lb 12.8 oz (91.1 kg)  01/10/19 203 lb (92.1 kg)      ASSESSMENT & PLAN:    1.  Paroxysmal atrial fibrillation S/p ablation in 1999 continues to have afib. I would like to review her apple watch tracings. The patient has symptomatic, recurrent  atrial fibrillation.  S/p ablation in [place] in 1999 she has failed medical therapy with rhythmol Chads2vasc score is at least 3.  she is anticoagulated with eliquis . Therapeutic strategies for afib including medicine and ablation were discussed in detail with the patient today. Risk, benefits, and alternatives to repeat EP study and radiofrequency ablation for afib were also discussed in detail today. These risks include but are not limited to stroke, bleeding, vascular damage, tamponade, perforation, damage to the esophagus, lungs, and other structures, pulmonary vein stenosis, worsening  renal function, and death. The patient understands these risk and wishes to proceed.  We will therefore proceed with catheter ablation at the next available time.  Carto, ICE, anesthesia are requested for the procedure.  Will also obtain cardiac CT with FFR prior to the procedure to exclude LAA thrombus and further evaluate atrial anatomy.  2. Chest pain Will assess with cardiac CT with FFR prior to ablation  3. OSA Compliance with CPAP encourage     Patient Risk:  after full review of this patients clinical status,  I feel that they are at moderate risk at this time.  Today, I have spent 25 minutes with the patient with telehealth technology discussing arrhythmia management .    Army Fossa, MD  05/09/2019 10:55 AM     [facility] [address] [place] [address] [phone] (office) [phone] (fax)

## 2019-05-28 ENCOUNTER — Telehealth: Payer: Self-pay

## 2019-06-28 ENCOUNTER — Telehealth: Payer: Self-pay

## 2019-06-28 DIAGNOSIS — R079 Chest pain, unspecified: Secondary | ICD-10-CM

## 2019-06-28 DIAGNOSIS — I480 Paroxysmal atrial fibrillation: Secondary | ICD-10-CM

## 2019-07-01 ENCOUNTER — Telehealth: Payer: Self-pay | Admitting: Internal Medicine

## 2019-07-01 NOTE — Telephone Encounter (Signed)
Spoke with patient and she is schedule for 07-12-19 @ 11:30.   She has several question she needed answers regarding her Ablation, medicines and Covid test.   Please call to advise.     She has f/u AF  08-16-19  And f/u with [facility] 10-11-19

## 2019-07-08 MED ORDER — METOPROLOL TARTRATE 100 MG PO TABS: 100.0000 mg | ORAL_TABLET | Freq: Once | ORAL | 0 refills | Status: DC

## 2019-07-08 NOTE — Telephone Encounter (Signed)
Completed instruction letters.  Covid/labs scheduled.  Sent message via [place].  Will await further needs.

## 2019-07-11 ENCOUNTER — Telehealth (HOSPITAL_COMMUNITY): Payer: Self-pay | Admitting: Emergency Medicine

## 2019-07-11 NOTE — Telephone Encounter (Signed)
Reaching out to patient to offer assistance regarding upcoming cardiac imaging study; pt verbalizes understanding of appt date/time, parking situation and where to check in, pre-test NPO status and medications ordered, and verified current allergies; name and call back number provided for further questions should they arise Marchia Bond RN [employer] [facility] [phone] office [phone] cell  Pt denies covid symptoms, verbalized understanding of visitor policy.  Pt stated she was not informed that there was medication for her to pick up from pharm. I called her pharm and verified that metoprolol 100mg  was ready for pickup this afternoon. Pt verbalized understanding to take metoprolol 2 hr prior to scan.

## 2019-07-12 ENCOUNTER — Other Ambulatory Visit: Payer: Self-pay

## 2019-07-12 ENCOUNTER — Ambulatory Visit (HOSPITAL_COMMUNITY)
Admission: RE | Admit: 2019-07-12 | Discharge: 2019-07-12 | Disposition: A | Discharge status: Home / Self Care | Payer: Medicare Other | Source: Ambulatory Visit | Attending: Internal Medicine | Admitting: Internal Medicine

## 2019-07-12 ENCOUNTER — Ambulatory Visit (HOSPITAL_COMMUNITY): Payer: Medicare Other

## 2019-07-12 DIAGNOSIS — I4891 Unspecified atrial fibrillation: Secondary | ICD-10-CM | POA: Diagnosis not present

## 2019-07-12 DIAGNOSIS — R079 Chest pain, unspecified: Secondary | ICD-10-CM

## 2019-07-12 DIAGNOSIS — I480 Paroxysmal atrial fibrillation: Secondary | ICD-10-CM | POA: Diagnosis present

## 2019-07-12 DIAGNOSIS — R0789 Other chest pain: Secondary | ICD-10-CM | POA: Insufficient documentation

## 2019-07-12 LAB — POCT I-STAT CREATININE: Creatinine, Ser: 0.7 mg/dL (ref 0.44–1.00)

## 2019-07-12 MED ORDER — NITROGLYCERIN 0.4 MG SL SUBL
0.8000 mg | SUBLINGUAL_TABLET | Freq: Once | SUBLINGUAL | Status: AC
  Administered 2019-07-12: 0.8 mg via SUBLINGUAL
  Filled 2019-07-12: qty 25

## 2019-07-12 MED ORDER — NITROGLYCERIN 0.4 MG SL SUBL
SUBLINGUAL_TABLET | SUBLINGUAL | Status: AC
  Filled 2019-07-12: qty 2

## 2019-07-12 MED ORDER — IOHEXOL 350 MG/ML SOLN
80.0000 mL | Freq: Once | INTRAVENOUS | Status: AC | PRN
  Administered 2019-07-12: 11:00:00 80 mL via INTRAVENOUS

## 2019-07-12 NOTE — Progress Notes (Signed)
CT scan completed. Tolerated well. Discharged  home awake and alert. In no distress 

## 2019-07-16 ENCOUNTER — Other Ambulatory Visit: Payer: Medicare Other

## 2019-07-16 ENCOUNTER — Other Ambulatory Visit (HOSPITAL_COMMUNITY)
Admission: RE | Admit: 2019-07-16 | Discharge: 2019-07-16 | Disposition: A | Discharge status: Home / Self Care | Payer: Medicare Other | Source: Ambulatory Visit | Attending: Internal Medicine | Admitting: Internal Medicine

## 2019-07-16 ENCOUNTER — Other Ambulatory Visit: Payer: Self-pay

## 2019-07-16 DIAGNOSIS — I480 Paroxysmal atrial fibrillation: Secondary | ICD-10-CM

## 2019-07-16 DIAGNOSIS — Z01812 Encounter for preprocedural laboratory examination: Secondary | ICD-10-CM | POA: Insufficient documentation

## 2019-07-16 DIAGNOSIS — Z20828 Contact with and (suspected) exposure to other viral communicable diseases: Secondary | ICD-10-CM | POA: Insufficient documentation

## 2019-07-16 LAB — CBC WITH DIFFERENTIAL/PLATELET
Basophils Absolute: 0.1 10*3/uL (ref 0.0–0.2)
Basos: 1 %
EOS (ABSOLUTE): 0.2 10*3/uL (ref 0.0–0.4)
Eos: 3 %
Hematocrit: 42.8 % (ref 34.0–46.6)
Hemoglobin: 13.7 g/dL (ref 11.1–15.9)
Immature Grans (Abs): 0 10*3/uL (ref 0.0–0.1)
Immature Granulocytes: 0 %
Lymphocytes Absolute: 1.1 10*3/uL (ref 0.7–3.1)
Lymphs: 19 %
MCH: 29.9 pg (ref 26.6–33.0)
MCHC: 32 g/dL (ref 31.5–35.7)
MCV: 93 fL (ref 79–97)
Monocytes Absolute: 0.5 10*3/uL (ref 0.1–0.9)
Monocytes: 8 %
Neutrophils Absolute: 4 10*3/uL (ref 1.4–7.0)
Neutrophils: 69 %
Platelets: 202 10*3/uL (ref 150–450)
RBC: 4.58 x10E6/uL (ref 3.77–5.28)
RDW: 13.4 % (ref 11.7–15.4)
WBC: 5.9 10*3/uL (ref 3.4–10.8)

## 2019-07-16 LAB — BASIC METABOLIC PANEL
BUN/Creatinine Ratio: 23 (ref 12–28)
BUN: 18 mg/dL (ref 8–27)
CO2: 21 mmol/L (ref 20–29)
Calcium: 9.4 mg/dL (ref 8.7–10.3)
Chloride: 103 mmol/L (ref 96–106)
Creatinine, Ser: 0.78 mg/dL (ref 0.57–1.00)
GFR calc Af Amer: 86 mL/min/{1.73_m2} (ref >59)
GFR calc non Af Amer: 75 mL/min/{1.73_m2} (ref >59)
Glucose: 106 mg/dL — ABNORMAL HIGH (ref 65–99)
Potassium: 4.9 mmol/L (ref 3.5–5.2)
Sodium: 141 mmol/L (ref 134–144)

## 2019-07-16 LAB — SARS CORONAVIRUS 2 (TAT 6-24 HRS): SARS Coronavirus 2: NEGATIVE

## 2019-07-16 NOTE — Telephone Encounter (Signed)
Work up complete. 

## 2019-07-19 ENCOUNTER — Ambulatory Visit (HOSPITAL_COMMUNITY): Payer: Medicare Other | Admitting: Anesthesiology

## 2019-07-19 ENCOUNTER — Ambulatory Visit (HOSPITAL_COMMUNITY)
Admission: RE | Admit: 2019-07-19 | Discharge: 2019-07-20 | Disposition: A | Discharge status: Home / Self Care | Payer: Medicare Other | Attending: Internal Medicine | Admitting: Internal Medicine

## 2019-07-19 ENCOUNTER — Encounter (HOSPITAL_COMMUNITY)
Admission: RE | Disposition: A | Discharge status: Home / Self Care | Payer: Self-pay | Source: Home / Self Care | Attending: Internal Medicine

## 2019-07-19 ENCOUNTER — Encounter (HOSPITAL_COMMUNITY): Payer: Self-pay | Admitting: Certified Registered Nurse Anesthetist

## 2019-07-19 DIAGNOSIS — F419 Anxiety disorder, unspecified: Secondary | ICD-10-CM | POA: Diagnosis not present

## 2019-07-19 DIAGNOSIS — Z7901 Long term (current) use of anticoagulants: Secondary | ICD-10-CM | POA: Diagnosis not present

## 2019-07-19 DIAGNOSIS — I480 Paroxysmal atrial fibrillation: Secondary | ICD-10-CM | POA: Diagnosis not present

## 2019-07-19 DIAGNOSIS — G4733 Obstructive sleep apnea (adult) (pediatric): Secondary | ICD-10-CM | POA: Diagnosis not present

## 2019-07-19 DIAGNOSIS — E785 Hyperlipidemia, unspecified: Secondary | ICD-10-CM | POA: Diagnosis not present

## 2019-07-19 DIAGNOSIS — Z87891 Personal history of nicotine dependence: Secondary | ICD-10-CM | POA: Insufficient documentation

## 2019-07-19 DIAGNOSIS — K5790 Diverticulosis of intestine, part unspecified, without perforation or abscess without bleeding: Secondary | ICD-10-CM | POA: Diagnosis not present

## 2019-07-19 DIAGNOSIS — F329 Major depressive disorder, single episode, unspecified: Secondary | ICD-10-CM | POA: Diagnosis not present

## 2019-07-19 DIAGNOSIS — G2581 Restless legs syndrome: Secondary | ICD-10-CM | POA: Diagnosis not present

## 2019-07-19 DIAGNOSIS — K219 Gastro-esophageal reflux disease without esophagitis: Secondary | ICD-10-CM | POA: Diagnosis not present

## 2019-07-19 DIAGNOSIS — I4892 Unspecified atrial flutter: Secondary | ICD-10-CM | POA: Diagnosis not present

## 2019-07-19 HISTORY — PX: ATRIAL FIBRILLATION ABLATION: EP1191

## 2019-07-19 LAB — POCT ACTIVATED CLOTTING TIME: Activated Clotting Time: 290 seconds

## 2019-07-19 SURGERY — ATRIAL FIBRILLATION ABLATION
Anesthesia: General

## 2019-07-19 MED ORDER — HEPARIN SODIUM (PORCINE) 1000 UNIT/ML IJ SOLN
INTRAMUSCULAR | Status: DC | PRN
  Administered 2019-07-19: 12000 [IU] via INTRAVENOUS

## 2019-07-19 MED ORDER — ONDANSETRON HCL 4 MG/2ML IJ SOLN: 4.0000 mg | Freq: Four times a day (QID) | INTRAMUSCULAR | Status: DC | PRN

## 2019-07-19 MED ORDER — HEPARIN (PORCINE) IN NACL 1000-0.9 UT/500ML-% IV SOLN
INTRAVENOUS | Status: DC | PRN
  Administered 2019-07-19: 500 mL

## 2019-07-19 MED ORDER — APIXABAN 5 MG PO TABS
5.0000 mg | ORAL_TABLET | Freq: Two times a day (BID) | ORAL | Status: DC
  Administered 2019-07-19 – 2019-07-20 (×3): 5 mg via ORAL
  Filled 2019-07-19 (×3): qty 1

## 2019-07-19 MED ORDER — ONDANSETRON HCL 4 MG/2ML IJ SOLN
INTRAMUSCULAR | Status: DC | PRN
  Administered 2019-07-19: 4 mg via INTRAVENOUS

## 2019-07-19 MED ORDER — THYROID 30 MG PO TABS
30.0000 mg | ORAL_TABLET | Freq: Every day | ORAL | Status: DC
  Administered 2019-07-20: 30 mg via ORAL
  Filled 2019-07-19: qty 1

## 2019-07-19 MED ORDER — HEPARIN SODIUM (PORCINE) 1000 UNIT/ML IJ SOLN
INTRAMUSCULAR | Status: DC | PRN
  Administered 2019-07-19: 3000 [IU] via INTRAVENOUS

## 2019-07-19 MED ORDER — ISOPROTERENOL HCL 0.2 MG/ML IJ SOLN
INTRAMUSCULAR | Status: AC
  Filled 2019-07-19: qty 5

## 2019-07-19 MED ORDER — HYDROCODONE-ACETAMINOPHEN 5-325 MG PO TABS
1.0000 | ORAL_TABLET | ORAL | Status: DC | PRN
  Filled 2019-07-19: qty 2

## 2019-07-19 MED ORDER — BUPIVACAINE HCL (PF) 0.25 % IJ SOLN
INTRAMUSCULAR | Status: DC | PRN
  Administered 2019-07-19: 30 mL

## 2019-07-19 MED ORDER — SUGAMMADEX SODIUM 200 MG/2ML IV SOLN
INTRAVENOUS | Status: DC | PRN
  Administered 2019-07-19: 181.4 mg via INTRAVENOUS

## 2019-07-19 MED ORDER — ROPINIROLE HCL 1 MG PO TABS
3.0000 mg | ORAL_TABLET | Freq: Every day | ORAL | Status: DC
  Administered 2019-07-19 (×2): 3 mg via ORAL
  Filled 2019-07-19 (×2): qty 3

## 2019-07-19 MED ORDER — HEPARIN SODIUM (PORCINE) 1000 UNIT/ML IJ SOLN
INTRAMUSCULAR | Status: AC
  Filled 2019-07-19: qty 2

## 2019-07-19 MED ORDER — ACETAMINOPHEN 325 MG PO TABS
650.0000 mg | ORAL_TABLET | ORAL | Status: DC | PRN
  Administered 2019-07-19: 650 mg via ORAL
  Filled 2019-07-19: qty 2

## 2019-07-19 MED ORDER — SERTRALINE HCL 50 MG PO TABS
150.0000 mg | ORAL_TABLET | Freq: Every day | ORAL | Status: DC
  Administered 2019-07-19: 22:00:00 150 mg via ORAL
  Filled 2019-07-19 (×3): qty 1

## 2019-07-19 MED ORDER — PROPOFOL 10 MG/ML IV BOLUS
INTRAVENOUS | Status: DC | PRN
  Administered 2019-07-19: 120 mg via INTRAVENOUS
  Administered 2019-07-19: 30 mg via INTRAVENOUS

## 2019-07-19 MED ORDER — HEPARIN (PORCINE) IN NACL 1000-0.9 UT/500ML-% IV SOLN
INTRAVENOUS | Status: AC
  Filled 2019-07-19: qty 500

## 2019-07-19 MED ORDER — FENTANYL CITRATE (PF) 100 MCG/2ML IJ SOLN
INTRAMUSCULAR | Status: DC | PRN
  Administered 2019-07-19: 50 ug via INTRAVENOUS
  Administered 2019-07-19 (×2): 25 ug via INTRAVENOUS

## 2019-07-19 MED ORDER — BUPIVACAINE HCL (PF) 0.25 % IJ SOLN
INTRAMUSCULAR | Status: AC
  Filled 2019-07-19: qty 30

## 2019-07-19 MED ORDER — PHENYLEPHRINE HCL-NACL 10-0.9 MG/250ML-% IV SOLN
INTRAVENOUS | Status: AC
  Filled 2019-07-19: qty 250

## 2019-07-19 MED ORDER — SODIUM CHLORIDE 0.9 % IV SOLN
INTRAVENOUS | Status: DC
  Administered 2019-07-19: 06:00:00 via INTRAVENOUS

## 2019-07-19 MED ORDER — LIDOCAINE 2% (20 MG/ML) 5 ML SYRINGE
INTRAMUSCULAR | Status: DC | PRN
  Administered 2019-07-19: 100 mg via INTRAVENOUS

## 2019-07-19 MED ORDER — ROCURONIUM BROMIDE 10 MG/ML (PF) SYRINGE
PREFILLED_SYRINGE | INTRAVENOUS | Status: DC | PRN
  Administered 2019-07-19: 10 mg via INTRAVENOUS
  Administered 2019-07-19: 50 mg via INTRAVENOUS

## 2019-07-19 MED ORDER — HEPARIN SODIUM (PORCINE) 1000 UNIT/ML IJ SOLN
INTRAMUSCULAR | Status: DC | PRN
  Administered 2019-07-19: 1000 [IU] via INTRAVENOUS

## 2019-07-19 MED ORDER — PHENYLEPHRINE 40 MCG/ML (10ML) SYRINGE FOR IV PUSH (FOR BLOOD PRESSURE SUPPORT)
PREFILLED_SYRINGE | INTRAVENOUS | Status: DC | PRN
  Administered 2019-07-19: 80 ug via INTRAVENOUS
  Administered 2019-07-19: 40 ug via INTRAVENOUS

## 2019-07-19 MED ORDER — SODIUM CHLORIDE 0.9 % IV SOLN: 250.0000 mL | INTRAVENOUS | Status: DC | PRN

## 2019-07-19 MED ORDER — PROTAMINE SULFATE 10 MG/ML IV SOLN
INTRAVENOUS | Status: DC | PRN
  Administered 2019-07-19: 40 mg via INTRAVENOUS

## 2019-07-19 MED ORDER — SODIUM CHLORIDE 0.9% FLUSH: 3.0000 mL | INTRAVENOUS | Status: DC | PRN

## 2019-07-19 MED ORDER — SODIUM CHLORIDE 0.9 % IV SOLN
INTRAVENOUS | Status: DC | PRN
  Administered 2019-07-19: 25 ug/min via INTRAVENOUS

## 2019-07-19 MED ORDER — DEXAMETHASONE SODIUM PHOSPHATE 10 MG/ML IJ SOLN
INTRAMUSCULAR | Status: DC | PRN
  Administered 2019-07-19: 10 mg via INTRAVENOUS

## 2019-07-19 MED ORDER — SODIUM CHLORIDE 0.9% FLUSH
3.0000 mL | Freq: Two times a day (BID) | INTRAVENOUS | Status: DC
  Administered 2019-07-19 (×2): 3 mL via INTRAVENOUS

## 2019-07-19 MED ORDER — ISOPROTERENOL HCL 0.2 MG/ML IJ SOLN
INTRAVENOUS | Status: DC | PRN
  Administered 2019-07-19: 10:00:00 20 ug/min via INTRAVENOUS

## 2019-07-19 SURGICAL SUPPLY — 17 items
BLANKET WARM UNDERBOD FULL ACC (MISCELLANEOUS) ×3 IMPLANT
CATH MAPPNG PENTARAY F 2-6-2MM (CATHETERS) IMPLANT
CATH SMTCH THERMOCOOL SF DF (CATHETERS) ×2 IMPLANT
CATH SOUNDSTAR 3D IMAGING (CATHETERS) ×2 IMPLANT
CATH WEBSTER BI DIR CS D-F CRV (CATHETERS) ×2 IMPLANT
COVER SWIFTLINK CONNECTOR (BAG) ×3 IMPLANT
NDL BAYLIS TRANSSEPTAL 71CM (NEEDLE) IMPLANT
NEEDLE BAYLIS TRANSSEPTAL 71CM (NEEDLE) ×3 IMPLANT
PACK EP LATEX FREE (CUSTOM PROCEDURE TRAY) ×2
PACK EP LF (CUSTOM PROCEDURE TRAY) ×1 IMPLANT
PAD PRO RADIOLUCENT 2001M-C (PAD) ×3 IMPLANT
PATCH CARTO3 (PAD) ×2 IMPLANT
PENTARAY F 2-6-2MM (CATHETERS) ×3
SHEATH AVANTI 11F 11CM (SHEATH) ×2 IMPLANT
SHEATH PINNACLE 7F 10CM (SHEATH) ×4 IMPLANT
SHEATH SWARTZ TS SL2 63CM 8.5F (SHEATH) ×2 IMPLANT
TUBING SMART ABLATE COOLFLOW (TUBING) ×2 IMPLANT

## 2019-07-19 NOTE — Transfer of Care (Signed)
Immediate Anesthesia Transfer of Care Note  Patient: Frances Smith  Procedure(s) Performed: ATRIAL FIBRILLATION ABLATION (N/A )  Patient Location: PACU  Anesthesia Type:General  Level of Consciousness: awake, alert , oriented and patient cooperative  Airway & Oxygen Therapy: Patient Spontanous Breathing and Patient connected to nasal cannula oxygen  Post-op Assessment: Report given to RN and Post -op Vital signs reviewed and stable  Post vital signs: Reviewed and stable  Last Vitals:  Vitals Value Taken Time  BP    Temp 36 C 07/19/19 1013  Pulse 77 07/19/19 1015  Resp 23 07/19/19 1015  SpO2 96 % 07/19/19 1015  Vitals shown include unvalidated device data.  Last Pain:  Vitals:   07/19/19 1013  TempSrc:   PainSc: 0-No pain      Patients Stated Pain Goal: 4 ([id number])  Complications: No apparent anesthesia complications

## 2019-07-19 NOTE — Progress Notes (Signed)
Site area: rt groin 3-way stop cock and suture removed Site Prior to Removal:  Level 0 Pressure Applied For: 5 minutes Manual:   yes Patient Status During Pull:  stable Post Pull Site:  Level 0 Post Pull Instructions Given:  yes Post Pull Pulses Present: rt dp palpable Dressing Applied:  Gauze and tegaderm  Bedrest begins @ 1000 Comments:  IV saline locked

## 2019-07-19 NOTE — Discharge Instructions (Signed)
Cardiac Ablation, Care After This sheet gives you information about how to care for yourself after your procedure. Your health care provider may also give you more specific instructions. If you have problems or questions, contact your health care provider. What can I expect after the procedure? After the procedure, it is common to have:  Bruising around your puncture site.  Tenderness around your puncture site.  Skipped heartbeats.  Tiredness (fatigue).  Follow these instructions at home: Puncture site care     Follow instructions from your health care provider about how to take care of your puncture site. Make sure you: ? Wash your hands with soap and water before you change your bandage (dressing). If soap and water are not available, use hand sanitizer. ? Change your dressing as told by your health care provider. ? If present, leave stitches (sutures), skin glue, or adhesive strips in place. These skin closures may need to stay in place for up to 2 weeks. If adhesive strip edges start to loosen and curl up, you may trim the loose edges. Do not remove adhesive strips completely unless your health care provider tells you to do that.  Check your puncture site every day for signs of infection. Check for: ? Redness, swelling, or pain. ? Fluid or blood. If your puncture site starts to bleed, lie down on your back, apply firm pressure to the area, and contact your health care provider. ? Warmth. ? Pus or a bad smell. Driving  Do not drive for 2 days after your procedure, or longer if instructed by your physician.  Do not drive or use heavy machinery while taking prescription pain medicine.  Do not drive for 24 hours if you were given a medicine to help you relax (sedative) during your procedure. Activity  Avoid activities that take a lot of effort for at least 3 days after your procedure.  Do not lift anything that is heavier than 5 lb (4.5 kg) for one week.   No sexual activity  for 1 week.   Return to your normal activities as told by your health care provider. Ask your health care provider what activities are safe for you. General instructions  Take over-the-counter and prescription medicines only as told by your health care provider.  Do not use any products that contain nicotine or tobacco, such as cigarettes and e-cigarettes. If you need help quitting, ask your health care provider.  You may shower after 24 hours, but Do not take baths, swim, or use a hot tub for 1 week.   Do not drink alcohol for 24 hours after your procedure.  Keep all follow-up visits as told by your health care provider. This is important. Contact a health care provider if:  You have redness, mild swelling, or pain around your puncture site.  You have fluid or blood coming from your puncture site that stops after applying firm pressure to the area.  Your puncture site feels warm to the touch.  You have pus or a bad smell coming from your puncture site.  You have a fever.  You have chest pain or discomfort that spreads to your neck, jaw, or arm.  You are sweating a lot.  You feel nauseous.  You have a fast or irregular heartbeat.  You have shortness of breath.  You are dizzy or light-headed and feel the need to lie down.  You have pain or numbness in the arm or leg closest to your puncture site. Get help right away  if:  Your puncture site suddenly swells.  Your puncture site is bleeding and the bleeding does not stop after applying firm pressure to the area. These symptoms may represent a serious problem that is an emergency. Do not wait to see if the symptoms will go away. Get medical help right away. Call your local emergency services (911 in the [place] Do not drive yourself to the hospital. Summary  After the procedure, it is normal to have bruising and tenderness at the puncture site in your groin, neck, or forearm.  Check your puncture site every day for signs of  infection.  Get help right away if your puncture site is bleeding and the bleeding does not stop after applying firm pressure to the area. This is a medical emergency. This information is not intended to replace advice given to you by your health care provider. Make sure you discuss any questions you have with your health care provider.

## 2019-07-19 NOTE — H&P (Signed)
Chief Complaint:  palpitations  History of Present Illness:    Frances Smith is a 73 y.o. female who presents for afib ablation.  Since last being seen in our clinic, the patient reports doing very well.  She continues to have some episodes of afib.  Episodes happen every 2-3 days.  She reports having some chest discomfort with episodes. Today, she denies symptoms of exertional chest pain, shortness of breath,  lower extremity edema, dizziness, presyncope, or syncope.  The patient is otherwise without complaint today.  The patient denies symptoms of fevers, chills, cough, or new SOB worrisome for COVID 19.      Past Medical History:  Diagnosis Date  . Anal fissure   . Anxiety   . Arthritis   . Atrial fibrillation ([place])   . Colon polyps   . Depression   . Diverticulosis   . Dysrhythmia   . GERD (gastroesophageal reflux disease)   . H/O: hysterectomy   . Heart murmur   . History of bronchitis as a child   . History of head injury   . History of measles   . History of mumps   . Hyperlipidemia   . IBS (irritable bowel syndrome)   . Menopause   . OSA on CPAP   . Osteopenia   . Paroxysmal atrial fibrillation ([facility])   . Restless leg syndrome   . Thyroid disease          Past Surgical History:  Procedure Laterality Date  . BREAST CYST INCISION AND DRAINAGE Left 1985  . BREAST SURGERY     aspiration of cyst / left   . COLONOSCOPY  2010  . ENDOMETRIAL ABLATION    . PARTIAL KNEE ARTHROPLASTY Left 08/10/2016   Procedure: LEFT KNEE UNICOMPARTMENTAL ARTHROPLASTY;  Surgeon: Gaynelle Arabian, MD;  Location: [facility];  Service: Orthopedics;  Laterality: Left;  . TONSILLECTOMY AND ADENOIDECTOMY  1949  . TREATMENT FISTULA ANAL  1988  . VAGINAL HYSTERECTOMY  1984          Current Outpatient Medications  Medication Sig Dispense Refill  . acetaminophen (TYLENOL) 500 MG tablet Take 500 mg by mouth 3 (three) times daily as needed (for pain.).    Marland Kitchen  apixaban (ELIQUIS) 5 MG TABS tablet Take 1 tablet (5 mg total) by mouth 2 (two) times daily. 180 tablet 3  . b complex vitamins tablet Take 1 tablet by mouth daily.    . budesonide (ENTOCORT EC) 3 MG 24 hr capsule Take 3 capsules (9 mg total) by mouth daily. 270 capsule 3  . celecoxib (CELEBREX) 200 MG capsule Take 200 mg by mouth daily.    . Cholecalciferol (D3-1000) 1000 units capsule Take 1,000 Units by mouth daily.    Marland Kitchen diltiazem (CARDIZEM) 30 MG tablet Take 1 tablet every 4 hours AS NEEDED for heart rate >100 as long as blood pressure >100. 60 tablet 1  . MAGNESIUM CHLORIDE-CALCIUM PO Take 1 tablet by mouth daily.    . Multiple Vitamins-Minerals (MULTIVITAMIN ADULT PO) Take by mouth daily.    Marland Kitchen omeprazole (PRILOSEC) 40 MG capsule Take 1 capsule (40 mg total) by mouth daily. 90 capsule 3  . pravastatin (PRAVACHOL) 40 MG tablet Take 40 mg by mouth daily.      . propafenone (RYTHMOL SR) 325 MG 12 hr capsule TAKE 1 CAPSULE BY MOUTH EVERY 12 HOURS 180 capsule 3  . rOPINIRole (REQUIP) 3 MG tablet Take 3 mg by mouth at bedtime.    . sertraline (ZOLOFT) 100  MG tablet Take 150 mg by mouth daily.     Marland Kitchen thyroid (ARMOUR) 30 MG tablet Take 30 mg by mouth daily before breakfast.     No current facility-administered medications for this visit.     Allergies:   Codeine   Social History:  The patient  reports that she quit smoking about 54 years ago. Her smoking use included cigarettes. She has a 2.50 pack-year smoking history. She has never used smokeless tobacco. She reports that she does not drink alcohol or use drugs.   Family History:  The patient's  family history includes Allergies in her mother; Anxiety disorder in her brother, daughter, and son; Asthma in her mother; Atrial fibrillation in her father; Breast cancer in her mother; Colon cancer (age of onset: 37) in her cousin; Colon cancer (age of onset: 8) in her maternal aunt; Depression in her daughter and son; Heart  failure (age of onset: 32) in her father; Thyroid disease in her brother.   ROS:  Please see the history of present illness.   All other systems are personally reviewed and negative.   Physical Exam: Vitals:   07/19/19 0544  BP: (!) 153/80  Pulse: 68  Resp: 16  Temp: 97.7 F (36.5 C)  TempSrc: Skin  SpO2: 98%  Weight: 90.7 kg  Height: 5\' 2"  (1.575 m)   GEN- The patient is well appearing, alert and oriented x 3 today.   Head- normocephalic, atraumatic Eyes-  Sclera clear, conjunctiva pink Ears- hearing intact Oropharynx- clear Neck- supple, Lungs- Clear to ausculation bilaterally, normal work of breathing Heart- Regular rate and rhythm, no murmurs, rubs or gallops, PMI not laterally displaced GI- soft, NT, ND, + BS Extremities- no clubbing, cyanosis, or edema, groin is without hematoma/ bruit MS- no significant deformity or atrophy Skin- no rash or lesion Psych- euthymic mood, full affect Neuro- strength and sensation are intact   Labs/Other Tests and Data Reviewed:    Recent Labs: No results found for requested labs within last 8760 hours.      Wt Readings from Last 3 Encounters:  05/09/19 197 lb (89.4 kg)  01/23/19 200 lb 12.8 oz (91.1 kg)  01/10/19 203 lb (92.1 kg)      ASSESSMENT & PLAN:    1.  Paroxysmal atrial fibrillation S/p ablation in 1999 continues to have afib. The patient has symptomatic, recurrent  atrial fibrillation.  S/p ablation in [place] in 1999 she has failed medical therapy with rhythmol Chads2vasc score is at least 3.  she is anticoagulated with eliquis . Therapeutic strategies for afib including medicine and ablation were discussed in detail with the patient today. Risk, benefits, and alternatives to repeat EP study and radiofrequency ablation for afib were also discussed in detail today. These risks include but are not limited to stroke, bleeding, vascular damage, tamponade, perforation, damage to the esophagus, lungs, and other  structures, pulmonary vein stenosis, worsening renal function, and death. The patient understands these risk and wishes to proceed.

## 2019-07-19 NOTE — Anesthesia Procedure Notes (Signed)
Procedure Name: Intubation Date/Time: 07/19/2019 7:51 AM Performed by: Raenette Rover, CRNA Pre-anesthesia Checklist: Patient identified, Emergency Drugs available, Suction available and Patient being monitored Patient Re-evaluated:Patient Re-evaluated prior to induction Oxygen Delivery Method: Circle system utilized Preoxygenation: Pre-oxygenation with 100% oxygen Induction Type: IV induction Ventilation: Mask ventilation without difficulty and Oral airway inserted - appropriate to patient size Laryngoscope Size: Mac and 3 Grade View: Grade II Tube type: Oral Tube size: 7.0 mm Number of attempts: 1 Airway Equipment and Method: Stylet Placement Confirmation: ETT inserted through vocal cords under direct vision,  positive ETCO2,  CO2 detector and breath sounds checked- equal and bilateral Secured at: 22 cm Tube secured with: Tape Dental Injury: Teeth and Oropharynx as per pre-operative assessment

## 2019-07-19 NOTE — Discharge Summary (Signed)
ELECTROPHYSIOLOGY PROCEDURE DISCHARGE SUMMARY    Patient ID: Frances Smith,  MRN: [id number], DOB/AGE: Mar 31, 1946 73 y.o.  Admit date: 07/19/2019 Discharge date: 07/20/2019   Primary Care Physician: Chesley Noon, MD  Electrophysiologist: Thompson Grayer, MD  Primary Discharge Diagnosis:  Paroxysmal atrial fibrillation  Secondary Discharge Diagnosis:  H/p afib ablation in 1999  Procedures This Admission:  Electrophysiology study and radiofrequency catheter ablation on 07/19/2019 by Dr Thompson Grayer.  This study demonstrated:  1. Sinus rhythm upon presentation.  2. Intracardiac echo reveals a moderate sized left atrium with four separate pulmonary veins without evidence of pulmonary vein stenosis. 3. Return of electrical conduction within all four PVs at baseline. 4, Successful electrical re-isolation and anatomical encircling of all four pulmonary veins with radiofrequency current.   A WACA approach was used 4. Cavo-tricuspid isthmus ablation was performed with complete bidirectional isthmus block achieved.  5. No inducible arrhythmias following ablation both on and off of Isuprel 6. No early apparent complications..    Brief HPI: Frances Smith is a 73 y.o. female with a history of paroxysmal atrial fibrillation.  They have failed medical therapy with rhythmol. Risks, benefits, and alternatives to catheter ablation of atrial fibrillation were reviewed with the patient who wished to proceed.  The patient underwent TEE prior to the procedure which demonstrated normal LV function and no LAA thrombus.    Hospital Course:  The patient was admitted and underwent EPS/RFCA of atrial fibrillation with details as outlined above.  They were monitored on telemetry overnight which demonstrated sinus rhythm.  Groin was without complication on the day of discharge.  The patient was examined and considered to be stable for discharge.  Wound care and restrictions were reviewed with the patient.  The  patient will be seen back by Roderic Palau, NP in 4 weeks and Dr Rayann Heman in 12 weeks for post ablation follow up.   This patients CHA2DS2-VASc Score and unadjusted Ischemic Stroke Rate (% per year) is equal to 3.2 % stroke rate/year from a score of 3 Above score calculated as 1 point each if present [CHF, HTN, DM, Vascular=MI/PAD/Aortic Plaque, Age if 65-74, or Female] Above score calculated as 2 points each if present [Age > 75, or Stroke/TIA/TE]   Physical Exam: Vitals:   07/19/19 1414 07/19/19 1416 07/19/19 1942 07/20/19 0348  BP:   113/60 124/74  Pulse: 76 75 68 64  Resp:   16 14  Temp: 98.6 F (37 C)  98 F (36.7 C) 98.5 F (36.9 C)  TempSrc: Oral  Oral Oral  SpO2:  94% 93% 96%  Weight:    91.7 kg  Height:        GEN- The patient is well appearing, alert and oriented x 3 today.   HEENT: normocephalic, atraumatic; sclera clear, conjunctiva pink; hearing intact; oropharynx clear; neck supple  Lungs- Clear to ausculation bilaterally, normal work of breathing.  No wheezes, rales, rhonchi Heart- Regular rate and rhythm, no murmurs, rubs or gallops  GI- soft, non-tender, non-distended, bowel sounds present  Extremities- no clubbing, cyanosis, or edema; DP/PT/radial pulses 2+ bilaterally, groin without hematoma/bruit MS- no significant deformity or atrophy Skin- warm and dry, no rash or lesion Psych- euthymic mood, full affect Neuro- strength and sensation are intact   Labs:   Lab Results  Component Value Date   WBC 5.9 07/16/2019   HGB 13.7 07/16/2019   HCT 42.8 07/16/2019   MCV 93 07/16/2019   PLT 202 07/16/2019    Recent  Labs  Lab 07/16/19 1035  NA 141  K 4.9  CL 103  CO2 21  BUN 18  CREATININE 0.78  CALCIUM 9.4  GLUCOSE 106*     Discharge Medications:  Allergies as of 07/20/2019      Reactions   Codeine Itching, Rash, Hives      Medication List    TAKE these medications   acetaminophen 500 MG tablet Commonly known as: TYLENOL Take 500 mg by mouth  3 (three) times daily as needed (for pain.).   apixaban 5 MG Tabs tablet Commonly known as: Eliquis Take 1 tablet (5 mg total) by mouth 2 (two) times daily.   b complex vitamins tablet Take 1 tablet by mouth daily.   budesonide 3 MG 24 hr capsule Commonly known as: ENTOCORT EC Take 3 capsules (9 mg total) by mouth daily.   celecoxib 200 MG capsule Commonly known as: CELEBREX Take 200 mg by mouth daily.   D3-1000 25 MCG (1000 UT) capsule Generic drug: Cholecalciferol Take 1,000 Units by mouth daily.   diltiazem 30 MG tablet Commonly known as: Cardizem Take 1 tablet every 4 hours AS NEEDED for heart rate >100 as long as blood pressure >100.   MAGNESIUM CHLORIDE-CALCIUM PO Take 1 tablet by mouth daily.   metoprolol tartrate 100 MG tablet Commonly known as: LOPRESSOR Take 1 tablet (100 mg total) by mouth once for 1 dose. Take one dose 2 hours prior to cardiac CT for heart rate greater than 55 BPM   MULTIVITAMIN ADULT PO Take by mouth daily.   omeprazole 40 MG capsule Commonly known as: PRILOSEC Take 1 capsule (40 mg total) by mouth daily.   pravastatin 40 MG tablet Commonly known as: PRAVACHOL Take 40 mg by mouth daily.   propafenone 325 MG 12 hr capsule Commonly known as: RYTHMOL SR TAKE 1 CAPSULE BY MOUTH EVERY 12 HOURS   rOPINIRole 3 MG tablet Commonly known as: REQUIP Take 3 mg by mouth at bedtime.   sertraline 100 MG tablet Commonly known as: ZOLOFT Take 150 mg by mouth daily.   thyroid 30 MG tablet Commonly known as: ARMOUR Take 30 mg by mouth daily before breakfast.       Disposition:   Follow-up Information    [facility] Follow up on 08/16/2019.   Specialty: Cardiology Why: at 0930 for post ablation follow up Contact information: [address] [id number] [place] [id number] [phone]       Thompson Grayer, MD Follow up on 10/21/2019.   Specialty: Cardiology Why: at 1030 for 3 month post  ablation follow up Contact information: [place] [address] [phone]           Duration of Discharge Encounter: Greater than 30 minutes including physician time.  Army Fossa MD, [facility] 07/20/2019 8:03 AM

## 2019-07-19 NOTE — Anesthesia Postprocedure Evaluation (Signed)
Anesthesia Post Note  Patient: Frances Smith  Procedure(s) Performed: ATRIAL FIBRILLATION ABLATION (N/A )     Patient location during evaluation: Cath Lab Anesthesia Type: General Level of consciousness: awake and alert Pain management: pain level controlled Vital Signs Assessment: post-procedure vital signs reviewed and stable Respiratory status: spontaneous breathing, nonlabored ventilation, respiratory function stable and patient connected to nasal cannula oxygen Cardiovascular status: blood pressure returned to baseline and stable Postop Assessment: no apparent nausea or vomiting Anesthetic complications: no    Last Vitals:  Vitals:   07/19/19 1414 07/19/19 1416  BP:    Pulse: 76 75  Resp:    Temp: 37 C   SpO2:  94%    Last Pain:  Vitals:   07/19/19 1414  TempSrc: Oral  PainSc:                  Doretta Remmert COKER

## 2019-07-19 NOTE — Anesthesia Preprocedure Evaluation (Signed)
Anesthesia Evaluation  Patient identified by MRN, date of birth, ID band Patient awake    Reviewed: Allergy & Precautions, NPO status , Patient's Chart, lab work & pertinent test results  Airway Mallampati: II  TM Distance: >3 FB Neck ROM: Full    Dental  (+) Teeth Intact, Dental Advisory Given   Pulmonary former smoker,    breath sounds clear to auscultation       Cardiovascular  Rhythm:Regular Rate:Normal     Neuro/Psych    GI/Hepatic   Endo/Other    Renal/GU      Musculoskeletal   Abdominal   Peds  Hematology   Anesthesia Other Findings   Reproductive/Obstetrics                             Anesthesia Physical Anesthesia Plan  ASA: III  Anesthesia Plan: General   Post-op Pain Management:    Induction: Intravenous  PONV Risk Score and Plan: Ondansetron and Dexamethasone  Airway Management Planned: Oral ETT  Additional Equipment:   Intra-op Plan:   Post-operative Plan: Extubation in OR  Informed Consent: I have reviewed the patients History and Physical, chart, labs and discussed the procedure including the risks, benefits and alternatives for the proposed anesthesia with the patient or authorized representative who has indicated his/her understanding and acceptance.   Dental advisory given  Plan Discussed with: CRNA and Anesthesiologist  Anesthesia Plan Comments:         Anesthesia Quick Evaluation  

## 2019-07-20 DIAGNOSIS — E785 Hyperlipidemia, unspecified: Secondary | ICD-10-CM | POA: Diagnosis not present

## 2019-07-20 DIAGNOSIS — G4733 Obstructive sleep apnea (adult) (pediatric): Secondary | ICD-10-CM | POA: Diagnosis not present

## 2019-07-20 DIAGNOSIS — I480 Paroxysmal atrial fibrillation: Secondary | ICD-10-CM

## 2019-07-20 DIAGNOSIS — Z7901 Long term (current) use of anticoagulants: Secondary | ICD-10-CM | POA: Diagnosis not present

## 2019-07-22 MED FILL — Heparin Sod (Porcine)-NaCl IV Soln 1000 Unit/500ML-0.9%: INTRAVENOUS | Qty: 500 | Status: AC

## 2019-08-16 ENCOUNTER — Encounter (HOSPITAL_COMMUNITY): Payer: Self-pay | Admitting: Nurse Practitioner

## 2019-08-16 ENCOUNTER — Ambulatory Visit (HOSPITAL_COMMUNITY)
Admission: RE | Admit: 2019-08-16 | Discharge: 2019-08-16 | Disposition: A | Discharge status: Home / Self Care | Payer: Medicare Other | Source: Ambulatory Visit | Attending: Nurse Practitioner | Admitting: Nurse Practitioner

## 2019-08-16 ENCOUNTER — Other Ambulatory Visit: Payer: Self-pay

## 2019-08-16 VITALS — BP 130/82 | HR 73 | Ht 62.0 in | Wt 204.8 lb

## 2019-08-16 DIAGNOSIS — K5790 Diverticulosis of intestine, part unspecified, without perforation or abscess without bleeding: Secondary | ICD-10-CM | POA: Insufficient documentation

## 2019-08-16 DIAGNOSIS — K219 Gastro-esophageal reflux disease without esophagitis: Secondary | ICD-10-CM | POA: Diagnosis not present

## 2019-08-16 DIAGNOSIS — K589 Irritable bowel syndrome without diarrhea: Secondary | ICD-10-CM | POA: Diagnosis not present

## 2019-08-16 DIAGNOSIS — F419 Anxiety disorder, unspecified: Secondary | ICD-10-CM | POA: Diagnosis not present

## 2019-08-16 DIAGNOSIS — Z7901 Long term (current) use of anticoagulants: Secondary | ICD-10-CM | POA: Diagnosis not present

## 2019-08-16 DIAGNOSIS — F329 Major depressive disorder, single episode, unspecified: Secondary | ICD-10-CM | POA: Diagnosis not present

## 2019-08-16 DIAGNOSIS — Z79899 Other long term (current) drug therapy: Secondary | ICD-10-CM | POA: Diagnosis not present

## 2019-08-16 DIAGNOSIS — Z87891 Personal history of nicotine dependence: Secondary | ICD-10-CM | POA: Insufficient documentation

## 2019-08-16 DIAGNOSIS — I480 Paroxysmal atrial fibrillation: Secondary | ICD-10-CM

## 2019-08-16 DIAGNOSIS — Z79890 Hormone replacement therapy (postmenopausal): Secondary | ICD-10-CM | POA: Insufficient documentation

## 2019-08-16 NOTE — Progress Notes (Signed)
Primary Care Physician: Chesley Noon, MD Referring Physician: Dr. Meryl Crutch Frances Smith is a 73 y.o. female with a h/o afib ablation in 1999 in [place], [place],  and again one month ago by Dr. Rayann Heman. She reports that she has not had any significant afib since the ablation. No swallowing or groin issues.  Today, she denies symptoms of palpitations, chest pain, shortness of breath, orthopnea, PND, lower extremity edema, dizziness, presyncope, syncope, or neurologic sequela. The patient is tolerating medications without difficulties and is otherwise without complaint today.   Past Medical History:  Diagnosis Date  . Anal fissure   . Anxiety   . Arthritis   . Atrial fibrillation ([place])   . Colon polyps   . Depression   . Diverticulosis   . Dysrhythmia   . GERD (gastroesophageal reflux disease)   . H/O: hysterectomy   . Heart murmur   . History of bronchitis as a child   . History of head injury   . History of measles   . History of mumps   . Hyperlipidemia   . IBS (irritable bowel syndrome)   . Menopause   . OSA on CPAP   . Osteopenia   . Paroxysmal atrial fibrillation ([facility])   . Restless leg syndrome   . Thyroid disease    Past Surgical History:  Procedure Laterality Date  . ATRIAL FIBRILLATION ABLATION N/A 07/19/2019   Procedure: ATRIAL FIBRILLATION ABLATION;  Surgeon: Thompson Grayer, MD;  Location: [facility];  Service: Cardiovascular;  Laterality: N/A;  . BREAST CYST INCISION AND DRAINAGE Left 1985  . BREAST SURGERY     aspiration of cyst / left   . COLONOSCOPY  2010  . ENDOMETRIAL ABLATION    . PARTIAL KNEE ARTHROPLASTY Left 08/10/2016   Procedure: LEFT KNEE UNICOMPARTMENTAL ARTHROPLASTY;  Surgeon: Gaynelle Arabian, MD;  Location: [facility];  Service: Orthopedics;  Laterality: Left;  . TONSILLECTOMY AND ADENOIDECTOMY  1949  . TREATMENT FISTULA ANAL  1988  . VAGINAL HYSTERECTOMY  1984    Current Outpatient Medications  Medication Sig Dispense Refill  .  acetaminophen (TYLENOL) 500 MG tablet Take 500 mg by mouth 3 (three) times daily as needed (for pain.).    Marland Kitchen apixaban (ELIQUIS) 5 MG TABS tablet Take 1 tablet (5 mg total) by mouth 2 (two) times daily. 180 tablet 3  . b complex vitamins tablet Take 1 tablet by mouth daily.    . budesonide (ENTOCORT EC) 3 MG 24 hr capsule Take 3 capsules (9 mg total) by mouth daily. 270 capsule 3  . celecoxib (CELEBREX) 200 MG capsule Take 200 mg by mouth daily.    . Cholecalciferol (D3-1000) 1000 units capsule Take 1,000 Units by mouth daily.    Marland Kitchen diltiazem (CARDIZEM) 30 MG tablet Take 1 tablet every 4 hours AS NEEDED for heart rate >100 as long as blood pressure >100. 60 tablet 1  . MAGNESIUM CHLORIDE-CALCIUM PO Take 1 tablet by mouth daily.    . Multiple Vitamins-Minerals (MULTIVITAMIN ADULT PO) Take by mouth daily.    Marland Kitchen omeprazole (PRILOSEC) 40 MG capsule Take 1 capsule (40 mg total) by mouth daily. 90 capsule 3  . pravastatin (PRAVACHOL) 40 MG tablet Take 40 mg by mouth daily.      . propafenone (RYTHMOL SR) 325 MG 12 hr capsule TAKE 1 CAPSULE BY MOUTH EVERY 12 HOURS 180 capsule 3  . rOPINIRole (REQUIP) 3 MG tablet Take 3 mg by mouth at bedtime.    Marland Kitchen  sertraline (ZOLOFT) 100 MG tablet Take 150 mg by mouth daily.     Marland Kitchen thyroid (ARMOUR) 30 MG tablet Take 30 mg by mouth daily before breakfast.     No current facility-administered medications for this encounter.     Allergies  Allergen Reactions  . Codeine Itching, Rash and Hives    Social History   Socioeconomic History  . Marital status: Married    Spouse name: Not on file  . Number of children: 2  . Years of education: Not on file  . Highest education level: Not on file  Occupational History  . Occupation: retired    Comment: Therapist, sports, Geophysicist/field seismologist  Social Needs  . Financial resource strain: Not on file  . Food insecurity    Worry: Not on file    Inability: Not on file  . Transportation needs    Medical: Not on file    Non-medical: Not on  file  Tobacco Use  . Smoking status: Former Smoker    Packs/day: 0.50    Years: 5.00    Pack years: 2.50    Types: Cigarettes    Quit date: 11/21/1964    Years since quitting: 54.7  . Smokeless tobacco: Never Used  Substance and Sexual Activity  . Alcohol use: No  . Drug use: No  . Sexual activity: Not on file  Lifestyle  . Physical activity    Days per week: Not on file    Minutes per session: Not on file  . Stress: Not on file  Relationships  . Social Herbalist on phone: Not on file    Gets together: Not on file    Attends religious service: Not on file    Active member of club or organization: Not on file    Attends meetings of clubs or organizations: Not on file    Relationship status: Not on file  . Intimate partner violence    Fear of current or ex partner: Not on file    Emotionally abused: Not on file    Physically abused: Not on file    Forced sexual activity: Not on file  Other Topics Concern  . Not on file  Social History Narrative  . Not on file    Family History  Problem Relation Age of Onset  . Heart failure Father 80  . Atrial fibrillation Father   . Allergies Mother   . Asthma Mother   . Breast cancer Mother   . Colon cancer Maternal Aunt 9  . Colon cancer Cousin 26  . Thyroid disease Brother   . Anxiety disorder Daughter   . Depression Daughter   . Anxiety disorder Son   . Depression Son   . Anxiety disorder Brother     ROS- All systems are reviewed and negative except as per the HPI above  Physical Exam: Vitals:   08/16/19 0942  BP: 130/82  Pulse: 73  Weight: 92.9 kg  Height: 5\' 2"  (1.575 m)   Wt Readings from Last 3 Encounters:  08/16/19 92.9 kg  07/20/19 91.7 kg  05/09/19 89.4 kg    Labs: Lab Results  Component Value Date   NA 141 07/16/2019   K 4.9 07/16/2019   CL 103 07/16/2019   CO2 21 07/16/2019   GLUCOSE 106 (H) 07/16/2019   BUN 18 07/16/2019   CREATININE 0.78 07/16/2019   CALCIUM 9.4 07/16/2019   MG  2.2 02/28/2017   Lab Results  Component Value Date   INR  1.00 08/10/2016   Lab Results  Component Value Date   CHOL 201 (H) 02/03/2009   HDL 71.20 02/03/2009   TRIG 85.0 02/03/2009     GEN- The patient is well appearing, alert and oriented x 3 today.   Head- normocephalic, atraumatic Eyes-  Sclera clear, conjunctiva pink Ears- hearing intact Oropharynx- clear Neck- supple, no JVP Lymph- no cervical lymphadenopathy Lungs- Clear to ausculation bilaterally, normal work of breathing Heart- Regular rate and rhythm, no murmurs, rubs or gallops, PMI not laterally displaced GI- soft, NT, ND, + BS Extremities- no clubbing, cyanosis, or edema MS- no significant deformity or atrophy Skin- no rash or lesion Psych- euthymic mood, full affect Neuro- strength and sensation are intact  EKG- NSR at 73 bpm, PR int 178 ms, qrs int 84 ms, qtc 440 ms Epic records reviewed    Assessment and Plan: 1. Paroxysmal afib  S/p ablation one month ago and has done well maintaining  SR Continue propafenone 325 mg q 12 hours  2. CHA2DS2VASc score of 3 (agex2 and HTN) Continue Eliquis 5 mg bid  F/u with Dr. Rayann Heman 11/30  Geroge Baseman. Channah Godeaux, [facility] [address] [place], [place] [address] [phone]

## 2019-08-28 ENCOUNTER — Other Ambulatory Visit: Payer: Self-pay | Admitting: Internal Medicine

## 2019-09-26 ENCOUNTER — Telehealth: Payer: Self-pay | Admitting: Pulmonary Disease

## 2019-09-26 NOTE — Telephone Encounter (Signed)
Called and spoke to patient. Patient requested new CPAP orders. Patient has not been seen since 07/16/2018. Let patient know that we would need to schedule office visit and look at CPAP compliance. Patient stated she has not been wearing it as much as she should.  Scheduled patient for OV with Dr. Halford Chessman for 10/01/2019.  Nothing further needed at this time.

## 2019-10-01 ENCOUNTER — Other Ambulatory Visit: Payer: Self-pay

## 2019-10-01 ENCOUNTER — Encounter: Payer: Self-pay | Admitting: Pulmonary Disease

## 2019-10-01 ENCOUNTER — Ambulatory Visit (INDEPENDENT_AMBULATORY_CARE_PROVIDER_SITE_OTHER): Payer: Medicare Other | Admitting: Pulmonary Disease

## 2019-10-01 VITALS — BP 132/76 | HR 76 | Temp 97.3°F | Ht 62.0 in | Wt 207.6 lb

## 2019-10-01 DIAGNOSIS — G4733 Obstructive sleep apnea (adult) (pediatric): Secondary | ICD-10-CM

## 2019-10-01 DIAGNOSIS — R682 Dry mouth, unspecified: Secondary | ICD-10-CM | POA: Diagnosis not present

## 2019-10-01 DIAGNOSIS — Z9989 Dependence on other enabling machines and devices: Secondary | ICD-10-CM | POA: Diagnosis not present

## 2019-10-01 DIAGNOSIS — Z789 Other specified health status: Secondary | ICD-10-CM

## 2019-10-01 NOTE — Patient Instructions (Signed)
Will arrange for refitting of your CPAP mask ? ?Follow up in 1 year ?

## 2019-10-01 NOTE — Progress Notes (Signed)
[facility]  Chief Complaint  Patient presents with  . OSA (obstructive sleep apnea)    Constitutional:  BP 132/76 (BP Location: Left Arm, Patient Position: Sitting, Cuff Size: Normal)   Pulse 76   Temp (!) 97.3 F (36.3 C)   Ht 5\' 2"  (1.575 m)   Wt 207 lb 9.6 oz (94.2 kg)   SpO2 98% Comment: on room air  BMI 37.97 kg/m   Past Medical History:  Hypothyroidism, RLS, PAF, Osteopenia, IBS, HLD, GERD, Diverticulosis, Depression, Colon polyps, Anxiety  Brief Summary:  Frances Smith is a 73 y.o. female with obstructive sleep apnea.  She had problems with her GI system and A fib during the past several months.  Had trouble keeping up with CPAP.  She has nasal mask.  Has trouble with mouth leak and dryness.  Download showed AHI 0.2 with median CPAP 6 and 95 th percentile CPAP 7 cm H2O.   Physical Exam:   Appearance - well kempt   ENMT - clear nasal mucosa, midline nasal  septum, no oral exudates, no LAN, trachea midline  Respiratory - normal chest wall, normal respiratory effort, no accessory muscle use, no wheeze/rales  CV - s1s2 regular rate and rhythm, no murmurs, no peripheral edema, radial pulses symmetric  GI - soft, non tender, no masses  Lymph - no adenopathy noted in neck and axillary areas  MSK - normal gait  Ext - no cyanosis, clubbing, or joint inflammation noted  Skin - no rashes, lesions, or ulcers  Neuro - normal strength, oriented x 3  Psych - normal mood and affect   Assessment/Plan:   Obstructive sleep apnea. - she reports benefit from therapy - having trouble with nasal mask and dry mouth - will try changing her to full face mask - discussed how untreated sleep apnea can impact control of a fib - continue auto CPAP   Patient Instructions  Will arrange for refitting of your CPAP mask  Follow up in 1 year    Chesley Mires, MD [place] Pager: [phone] 10/01/2019, 11:51  AM  Flow Sheet    Sleep tests:  PSG 08/01/12 >> AHI 0 HST 02/22/18 >> AHI 10.6, SaO2 low 86% Auto CPAP 06/13/18 to 07/12/18 >> used on 29 of 30 nights with average 6 hrs 37 min.  Average AHI 0.8 with median CPAP 7 and 95 th percentile CPAP 9 cm H2O  Medications:   Allergies as of 10/01/2019      Reactions   Codeine Itching, Rash, Hives      Medication List       Accurate as of October 01, 2019 11:51 AM. If you have any questions, ask your nurse or doctor.        acetaminophen 500 MG tablet Commonly known as: TYLENOL Take 500 mg by mouth 3 (three) times daily as needed (for pain.).   apixaban 5 MG Tabs tablet Commonly known as: Eliquis Take 1 tablet (5 mg total) by mouth 2 (two) times daily.   b complex vitamins tablet Take 1 tablet by mouth daily.   budesonide 3 MG 24 hr capsule Commonly known as: ENTOCORT EC Take 3 capsules (9 mg total) by mouth daily.   celecoxib 200 MG capsule Commonly known as: CELEBREX Take 200 mg by mouth daily.   D3-1000 25 MCG (1000 UT) capsule Generic drug: Cholecalciferol Take 1,000 Units by mouth daily.   diltiazem 30 MG tablet Commonly known as: Cardizem Take 1 tablet  every 4 hours AS NEEDED for heart rate >100 as long as blood pressure >100.   MAGNESIUM CHLORIDE-CALCIUM PO Take 1 tablet by mouth daily.   MULTIVITAMIN ADULT PO Take by mouth daily.   omeprazole 40 MG capsule Commonly known as: PRILOSEC Take 1 capsule (40 mg total) by mouth daily.   pravastatin 40 MG tablet Commonly known as: PRAVACHOL Take 40 mg by mouth daily.   propafenone 325 MG 12 hr capsule Commonly known as: RYTHMOL SR TAKE 1 CAPSULE EVERY 12 HOURS   rOPINIRole 3 MG tablet Commonly known as: REQUIP Take 3 mg by mouth at bedtime.   sertraline 100 MG tablet Commonly known as: ZOLOFT Take 100 mg by mouth daily.   thyroid 30 MG tablet Commonly known as: ARMOUR Take 30 mg by mouth daily before breakfast.       Past Surgical History:  She   has a past surgical history that includes Endometrial ablation; Colonoscopy (2010); Vaginal hysterectomy (1984); Treatment fistula anal (1988); Breast surgery; Partial knee arthroplasty (Left, 08/10/2016); Breast cyst incision and drainage (Left, 1985); Tonsillectomy and adenoidectomy (1949); and ATRIAL FIBRILLATION ABLATION (N/A, 07/19/2019).  Family History:  Her family history includes Allergies in her mother; Anxiety disorder in her brother, daughter, and son; Asthma in her mother; Atrial fibrillation in her father; Breast cancer in her mother; Colon cancer (age of onset: 8) in her cousin; Colon cancer (age of onset: 52) in her maternal aunt; Depression in her daughter and son; Heart failure (age of onset: 86) in her father; Thyroid disease in her brother.  Social History:  She  reports that she quit smoking about 54 years ago. Her smoking use included cigarettes. She has a 2.50 pack-year smoking history. She has never used smokeless tobacco. She reports that she does not drink alcohol or use drugs.

## 2019-10-14 ENCOUNTER — Telehealth: Payer: Self-pay

## 2019-10-21 ENCOUNTER — Telehealth (INDEPENDENT_AMBULATORY_CARE_PROVIDER_SITE_OTHER): Payer: Medicare Other | Admitting: Internal Medicine

## 2019-10-21 ENCOUNTER — Encounter: Payer: Self-pay | Admitting: Internal Medicine

## 2019-10-21 VITALS — BP 126/78 | HR 73 | Ht 62.0 in | Wt 199.0 lb

## 2019-10-21 DIAGNOSIS — Z87891 Personal history of nicotine dependence: Secondary | ICD-10-CM

## 2019-10-21 DIAGNOSIS — G4733 Obstructive sleep apnea (adult) (pediatric): Secondary | ICD-10-CM | POA: Diagnosis not present

## 2019-10-21 DIAGNOSIS — Z7901 Long term (current) use of anticoagulants: Secondary | ICD-10-CM

## 2019-10-21 DIAGNOSIS — Z9989 Dependence on other enabling machines and devices: Secondary | ICD-10-CM

## 2019-10-21 DIAGNOSIS — I480 Paroxysmal atrial fibrillation: Secondary | ICD-10-CM

## 2019-10-21 NOTE — Progress Notes (Signed)
Electrophysiology TeleHealth Note   Due to national recommendations of social distancing due to COVID 19, an audio/video telehealth visit is felt to be most appropriate for this patient at this time.  See MyChart message from today for the patient's consent to telehealth for [facility].   Date:  10/21/2019   ID:  Frances Smith, DOB Oct 10, 1946, MRN [id number]  Location: patient's home  Provider location:  [facility]  Evaluation Performed: Follow-up visit  PCP:  Chesley Noon, MD   Electrophysiologist:  Dr Rayann Heman  Chief Complaint:  AF ablation follow up  History of Present Illness:    Frances Smith is a 73 y.o. female who presents via telehealth conferencing today.  Since last being seen in our clinic, the patient reports doing very well.  She has had no procedure related complications. No sustained AF since ablation. Today, she denies symptoms of palpitations, chest pain, shortness of breath,  lower extremity edema, dizziness, presyncope, or syncope.  The patient is otherwise without complaint today.  The patient denies symptoms of fevers, chills, cough, or new SOB worrisome for COVID 19.  Past Medical History:  Diagnosis Date  . Anal fissure   . Anxiety   . Arthritis   . Atrial fibrillation ([place])   . Colon polyps   . Depression   . Diverticulosis   . Dysrhythmia   . GERD (gastroesophageal reflux disease)   . H/O: hysterectomy   . Heart murmur   . History of bronchitis as a child   . History of head injury   . History of measles   . History of mumps   . Hyperlipidemia   . IBS (irritable bowel syndrome)   . Menopause   . OSA on CPAP   . Osteopenia   . Paroxysmal atrial fibrillation ([facility])   . Restless leg syndrome   . Thyroid disease     Past Surgical History:  Procedure Laterality Date  . ATRIAL FIBRILLATION ABLATION N/A 07/19/2019   Procedure: ATRIAL FIBRILLATION ABLATION;  Surgeon: Thompson Grayer, MD;  Location: [facility];  Service:  Cardiovascular;  Laterality: N/A;  . BREAST CYST INCISION AND DRAINAGE Left 1985  . BREAST SURGERY     aspiration of cyst / left   . COLONOSCOPY  2010  . ENDOMETRIAL ABLATION    . PARTIAL KNEE ARTHROPLASTY Left 08/10/2016   Procedure: LEFT KNEE UNICOMPARTMENTAL ARTHROPLASTY;  Surgeon: Gaynelle Arabian, MD;  Location: [facility];  Service: Orthopedics;  Laterality: Left;  . TONSILLECTOMY AND ADENOIDECTOMY  1949  . TREATMENT FISTULA ANAL  1988  . VAGINAL HYSTERECTOMY  1984    Current Outpatient Medications  Medication Sig Dispense Refill  . acetaminophen (TYLENOL) 500 MG tablet Take 500 mg by mouth 3 (three) times daily as needed (for pain.).    Marland Kitchen apixaban (ELIQUIS) 5 MG TABS tablet Take 1 tablet (5 mg total) by mouth 2 (two) times daily. 180 tablet 3  . b complex vitamins tablet Take 1 tablet by mouth daily.    . budesonide (ENTOCORT EC) 3 MG 24 hr capsule Take 3 capsules (9 mg total) by mouth daily. 270 capsule 3  . celecoxib (CELEBREX) 200 MG capsule Take 200 mg by mouth daily.    . Cholecalciferol (D3-1000) 1000 units capsule Take 1,000 Units by mouth daily.    Marland Kitchen diltiazem (CARDIZEM) 30 MG tablet Take 1 tablet every 4 hours AS NEEDED for heart rate >100 as long as blood pressure >100. 60 tablet 1  .  MAGNESIUM CHLORIDE-CALCIUM PO Take 1 tablet by mouth daily.    . Multiple Vitamins-Minerals (MULTIVITAMIN ADULT PO) Take by mouth daily.    Marland Kitchen omeprazole (PRILOSEC) 40 MG capsule Take 1 capsule (40 mg total) by mouth daily. 90 capsule 3  . pravastatin (PRAVACHOL) 40 MG tablet Take 40 mg by mouth daily.      . propafenone (RYTHMOL SR) 325 MG 12 hr capsule TAKE 1 CAPSULE EVERY 12 HOURS 180 capsule 1  . rOPINIRole (REQUIP) 3 MG tablet Take 3 mg by mouth at bedtime.    . sertraline (ZOLOFT) 100 MG tablet Take 100 mg by mouth daily.     Marland Kitchen thyroid (ARMOUR) 30 MG tablet Take 30 mg by mouth daily before breakfast.     No current facility-administered medications for this visit.     Allergies:   Codeine    Social History:  The patient  reports that she quit smoking about 54 years ago. Her smoking use included cigarettes. She has a 2.50 pack-year smoking history. She has never used smokeless tobacco. She reports that she does not drink alcohol or use drugs.   Family History:  The patient's  family history includes Allergies in her mother; Anxiety disorder in her brother, daughter, and son; Asthma in her mother; Atrial fibrillation in her father; Breast cancer in her mother; Colon cancer (age of onset: 45) in her cousin; Colon cancer (age of onset: 71) in her maternal aunt; Depression in her daughter and son; Heart failure (age of onset: 61) in her father; Thyroid disease in her brother.   ROS:  Please see the history of present illness.   All other systems are personally reviewed and negative.    Exam:    Vital Signs:  BP 126/78   Pulse 73   Ht 5\' 2"  (1.575 m)   Wt 199 lb (90.3 kg)   BMI 36.40 kg/m   Well sounding and appearing, alert and conversant, regular work of breathing,  good skin color Eyes- anicteric, neuro- grossly intact, skin- no apparent rash or lesions or cyanosis, mouth- oral mucosa is pink  Labs/Other Tests and Data Reviewed:    Recent Labs: 07/16/2019: BUN 18; Creatinine, Ser 0.78; Hemoglobin 13.7; Platelets 202; Potassium 4.9; Sodium 141   Wt Readings from Last 3 Encounters:  10/21/19 199 lb (90.3 kg)  10/01/19 207 lb 9.6 oz (94.2 kg)  08/16/19 204 lb 12.8 oz (92.9 kg)      ASSESSMENT & PLAN:    1.  Paroxysmal atrial fibrillation Doing very well post ablation Continue Eliquis for CHADS2VASC of 3 She uses her Apple Watch and has not had AF identified Decrease Rhythmol to once daily then discontinue   2.  OSA Compliant with CPAP   Follow-up:  With me in 3 months as a virtual visit    Patient Risk:  after full review of this patients clinical status, I feel that they are at moderate risk at this time.  Today, I have spent 15 minutes with the patient with  telehealth technology discussing arrhythmia management .    Army Fossa, MD  10/21/2019 10:34 AM     [facility] [address] [place] [address] [phone] (office) [phone] (fax)

## 2019-12-05 ENCOUNTER — Ambulatory Visit: Payer: Medicare Other | Attending: Internal Medicine

## 2019-12-05 DIAGNOSIS — Z23 Encounter for immunization: Secondary | ICD-10-CM | POA: Insufficient documentation

## 2019-12-05 NOTE — Progress Notes (Signed)
   Covid-19 Vaccination Clinic  Name:  Frances Smith    MRN: [id number] DOB: 1946/09/12  12/05/2019  [name] was observed post Covid-19 immunization for 30 minutes based on pre-vaccination screening without incidence. She was provided with Vaccine Information Sheet and instruction to access the V-Safe system.   [name] was instructed to call 911 with any severe reactions post vaccine: Marland Kitchen Difficulty breathing  . Swelling of your face and throat  . A fast heartbeat  . A bad rash all over your body  . Dizziness and weakness    Immunizations Administered    Name Date Dose VIS Date Route   Pfizer COVID-19 Vaccine 12/05/2019 12:13 PM 0.3 mL 11/01/2019 Intramuscular   Manufacturer: [employer], [employer]   Lot: [id number]   [place]: [id number]

## 2019-12-22 ENCOUNTER — Other Ambulatory Visit: Payer: Self-pay | Admitting: Internal Medicine

## 2019-12-23 NOTE — Telephone Encounter (Signed)
Pt last saw Dr Rayann Heman 10/21/19 video visit Covid-19, last labs 07/16/19 Creat 0.78, age 74, weight 90.3kg, based on specified criteria pt is on appropriate dosage of Eliquis 5mg  BID.  Will refill rx.

## 2019-12-24 ENCOUNTER — Ambulatory Visit: Payer: Medicare Other | Attending: Internal Medicine

## 2019-12-24 ENCOUNTER — Ambulatory Visit: Payer: Medicare Other

## 2019-12-24 DIAGNOSIS — Z23 Encounter for immunization: Secondary | ICD-10-CM | POA: Insufficient documentation

## 2019-12-24 NOTE — Progress Notes (Signed)
   Covid-19 Vaccination Clinic  Name:  Frances Smith    MRN: [id number] DOB: [birth year]  12/24/2019  Ms. Marik was observed post Covid-19 immunization for 30 minutes based on pre-vaccination screening without incidence. She was provided with Vaccine Information Sheet and instruction to access the V-Safe system.   Ms. Gunder was instructed to call 911 with any severe reactions post vaccine: Marland Kitchen Difficulty breathing  . Swelling of your face and throat  . A fast heartbeat  . A bad rash all over your body  . Dizziness and weakness    Immunizations Administered    Name Date Dose VIS Date Route   Pfizer COVID-19 Vaccine 12/24/2019  8:19 AM 0.3 mL 11/01/2019 Intramuscular   Manufacturer: [place]   Lot: [id number]   [place]: [id number]

## 2020-01-06 ENCOUNTER — Other Ambulatory Visit: Payer: Self-pay | Admitting: Family Medicine

## 2020-01-06 DIAGNOSIS — Z1231 Encounter for screening mammogram for malignant neoplasm of breast: Secondary | ICD-10-CM

## 2020-01-09 ENCOUNTER — Ambulatory Visit: Payer: Medicare Other

## 2020-01-27 ENCOUNTER — Telehealth: Payer: Medicare Other | Admitting: Internal Medicine

## 2020-02-07 ENCOUNTER — Other Ambulatory Visit: Payer: Self-pay | Admitting: Internal Medicine

## 2020-02-07 ENCOUNTER — Ambulatory Visit
Admission: RE | Admit: 2020-02-07 | Discharge: 2020-02-07 | Disposition: A | Discharge status: Home / Self Care | Payer: Medicare Other | Source: Ambulatory Visit | Attending: Family Medicine | Admitting: Family Medicine

## 2020-02-07 ENCOUNTER — Other Ambulatory Visit: Payer: Self-pay

## 2020-02-07 DIAGNOSIS — Z1231 Encounter for screening mammogram for malignant neoplasm of breast: Secondary | ICD-10-CM

## 2020-02-24 ENCOUNTER — Other Ambulatory Visit: Payer: Self-pay | Admitting: Internal Medicine

## 2020-03-16 ENCOUNTER — Encounter: Payer: Self-pay | Admitting: Internal Medicine

## 2020-03-16 ENCOUNTER — Telehealth (INDEPENDENT_AMBULATORY_CARE_PROVIDER_SITE_OTHER): Payer: Medicare Other | Admitting: Internal Medicine

## 2020-03-16 ENCOUNTER — Other Ambulatory Visit: Payer: Self-pay

## 2020-03-16 VITALS — BP 124/84 | HR 78 | Ht 62.0 in | Wt 186.6 lb

## 2020-03-16 DIAGNOSIS — G4733 Obstructive sleep apnea (adult) (pediatric): Secondary | ICD-10-CM

## 2020-03-16 DIAGNOSIS — D6869 Other thrombophilia: Secondary | ICD-10-CM

## 2020-03-16 DIAGNOSIS — I480 Paroxysmal atrial fibrillation: Secondary | ICD-10-CM | POA: Diagnosis not present

## 2020-03-16 NOTE — Patient Instructions (Addendum)
Medication Instruction ;  Your physician recommends that you continue on your current medications as directed. Please refer to the Current Medication list given to you today.   *If you need a refill on your cardiac medications before your next appointment, please call your pharmacy*   Lab Work: None ordered.  If you have labs (blood work) drawn today and your tests are completely normal, you will receive your results only by: Marland Kitchen MyChart Message (if you have MyChart) OR . A paper copy in the mail If you have any lab test that is abnormal or we need to change your treatment, we will call you to review the results.   Testing/Procedures: None ordered.    Follow-Up: At [facility], you and your health needs are our priority.  As part of our continuing mission to provide you with exceptional heart care, we have created designated Provider Care Teams.  These Care Teams include your primary Cardiologist (physician) and Advanced Practice Providers (APPs -  Physician Assistants and Nurse Practitioners) who all work together to provide you with the care you need, when you need it.  We recommend signing up for the patient portal called "MyChart".  Sign up information is provided on this After Visit Summary.  MyChart is used to connect with patients for Virtual Visits (Telemedicine).  Patients are able to view lab/test results, encounter notes, upcoming appointments, etc.  Non-urgent messages can be sent to your provider as well.   To learn more about what you can do with MyChart, go to [email or web address].    Your next appointment:   6 months with AFIB clinic  12 months with Dr Rayann Heman - you will receive a letter to remind you to schedule.   Provider:   Dr Rayann Heman   Other Instructions  Dr Rayann Heman has spoken with Dr Melford Aase and has recommended a referral to Dr Linus Mako with [facility].

## 2020-03-16 NOTE — Progress Notes (Signed)
Electrophysiology TeleHealth Note   Due to national recommendations of social distancing due to COVID 19, an audio/video telehealth visit is felt to be most appropriate for this patient at this time.  See MyChart message from today for the patient's consent to telehealth for [facility].  Date:  03/16/2020   ID:  Frances Smith, DOB [birth year], MRN [id number]  Location: patient's home  Provider location:  [place]  Evaluation Performed: Follow-up visit  PCP:  Chesley Noon, MD   Electrophysiologist:  Dr Rayann Heman  Chief Complaint:  palpitations  History of Present Illness:    Frances Smith is a 74 y.o. female who presents via telehealth conferencing today.  Since last being seen in our clinic, the patient reports doing very well.  Today, she denies symptoms of palpitations, chest pain, shortness of breath,  lower extremity edema, dizziness, presyncope, or syncope.  She has DJD and is considering knee surgery.  She has venous insufficiency in her R leg. The patient is otherwise without complaint today.   Past Medical History:  Diagnosis Date  . Anal fissure   . Anxiety   . Arthritis   . Atrial fibrillation ([place])   . Colon polyps   . Depression   . Diverticulosis   . Dysrhythmia   . GERD (gastroesophageal reflux disease)   . H/O: hysterectomy   . Heart murmur   . History of bronchitis as a child   . History of head injury   . History of measles   . History of mumps   . Hyperlipidemia   . IBS (irritable bowel syndrome)   . Menopause   . OSA on CPAP   . Osteopenia   . Paroxysmal atrial fibrillation ([facility])   . Restless leg syndrome   . Thyroid disease     Past Surgical History:  Procedure Laterality Date  . ATRIAL FIBRILLATION ABLATION N/A 07/19/2019   Procedure: ATRIAL FIBRILLATION ABLATION;  Surgeon: Thompson Grayer, MD;  Location: [place] [facility];  Service: Cardiovascular;  Laterality: N/A;  . BREAST CYST INCISION AND DRAINAGE Left 1985  . BREAST  SURGERY     aspiration of cyst / left   . COLONOSCOPY  2010  . ENDOMETRIAL ABLATION    . PARTIAL KNEE ARTHROPLASTY Left 08/10/2016   Procedure: LEFT KNEE UNICOMPARTMENTAL ARTHROPLASTY;  Surgeon: Gaynelle Arabian, MD;  Location: [facility];  Service: Orthopedics;  Laterality: Left;  . TONSILLECTOMY AND ADENOIDECTOMY  1949  . TREATMENT FISTULA ANAL  1988  . VAGINAL HYSTERECTOMY  1984    Current Outpatient Medications  Medication Sig Dispense Refill  . acetaminophen (TYLENOL) 500 MG tablet Take 500 mg by mouth 3 (three) times daily as needed (for pain.).    Marland Kitchen b complex vitamins tablet Take 1 tablet by mouth daily.    . budesonide (ENTOCORT EC) 3 MG 24 hr capsule Take 3 capsules (9 mg total) by mouth daily. 270 capsule 3  . Cholecalciferol (D3-1000) 1000 units capsule Take 1,000 Units by mouth daily.    Marland Kitchen diltiazem (CARDIZEM) 30 MG tablet Take 1 tablet every 4 hours AS NEEDED for heart rate >100 as long as blood pressure >100. 60 tablet 1  . ELIQUIS 5 MG TABS tablet TAKE 1 TABLET TWICE A DAY 180 tablet 1  . famotidine (PEPCID) 40 MG tablet Take 1 tablet by mouth daily.    Marland Kitchen MAGNESIUM CHLORIDE-CALCIUM PO Take 1 tablet by mouth daily.    . Multiple Vitamins-Minerals (MULTIVITAMIN ADULT PO) Take by  mouth daily.    . Naltrexone-buPROPion HCl ER 8-90 MG TB12 Take 2 tablets by mouth in the morning and at bedtime.    . pravastatin (PRAVACHOL) 40 MG tablet Take 40 mg by mouth daily.      Marland Kitchen rOPINIRole (REQUIP) 3 MG tablet Take 3 mg by mouth at bedtime.    . sertraline (ZOLOFT) 100 MG tablet Take 100 mg by mouth daily.     Marland Kitchen thyroid (ARMOUR) 30 MG tablet Take 30 mg by mouth daily before breakfast.     No current facility-administered medications for this visit.    Allergies:   Codeine   Social History:  The patient  reports that she quit smoking about 55 years ago. Her smoking use included cigarettes. She has a 2.50 pack-year smoking history. She has never used smokeless tobacco. She reports that she does  not drink alcohol or use drugs.   ROS:  Please see the history of present illness.   All other systems are personally reviewed and negative.    Exam:    Vital Signs:  BP 124/84   Pulse 78   Ht 5\' 2"  (1.575 m)   Wt 186 lb 9.6 oz (84.6 kg)   BMI 34.13 kg/m   Well sounding and appearing, alert and conversant, regular work of breathing,  good skin color Eyes- anicteric, neuro- grossly intact, skin- no apparent rash or lesions or cyanosis, mouth- oral mucosa is pink  Labs/Other Tests and Data Reviewed:    Recent Labs: 07/16/2019: BUN 18; Creatinine, Ser 0.78; Hemoglobin 13.7; Platelets 202; Potassium 4.9; Sodium 141   Wt Readings from Last 3 Encounters:  03/16/20 186 lb 9.6 oz (84.6 kg)  10/21/19 199 lb (90.3 kg)  10/01/19 207 lb 9.6 oz (94.2 kg)     ASSESSMENT & PLAN:    1.  Paroxysmal atrial fibrillation Doing very well post ablation off AAD therapy chads2vasc score is 3.  She is on eliquis.  2. Overweight She is in a [facility]. Weight loss program.  She is exercising regularly and pleased with results.  She has lost 20 lbs.  3.  OSA Compliant with CPAP  4. Venous insufficiency Will refer to  Dr Linus Mako at [place] [facility] as per my discussion with Dr Melford Aase today.  Follow-up: 6 months in AF clinic 12 months with me   Patient Risk:  after full review of this patients clinical status, I feel that they are at moderate risk at this time.  Today, I have spent 15 minutes with the patient with telehealth technology discussing arrhythmia management .    Army Fossa, MD  03/16/2020 11:48 AM     [facility] [address] [place] [address] [phone] (office) [phone] (fax)

## 2020-05-28 ENCOUNTER — Other Ambulatory Visit: Payer: Self-pay | Admitting: Internal Medicine

## 2020-05-28 NOTE — Telephone Encounter (Signed)
Eliquis 5mg  refill request received. Patient is 74 years old, weight-84.6kg, Crea-0.78 on 07/16/2019, Diagnosis-Afib, and last seen by Dr. Rayann Heman on 03/16/2020. Dose is appropriate based on dosing criteria. Will send in refill to requested pharmacy.

## 2020-08-24 ENCOUNTER — Other Ambulatory Visit: Payer: Self-pay | Admitting: Internal Medicine

## 2020-08-24 DIAGNOSIS — I480 Paroxysmal atrial fibrillation: Secondary | ICD-10-CM

## 2020-08-24 DIAGNOSIS — R079 Chest pain, unspecified: Secondary | ICD-10-CM

## 2020-08-31 ENCOUNTER — Telehealth: Payer: Self-pay | Admitting: Internal Medicine

## 2020-08-31 NOTE — Telephone Encounter (Signed)
New message    Pt calling stating she has been dealing with tinnitus for the last few days. And she is having concerns with her BP.   Pt c/o BP issue: STAT if pt c/o blurred vision, one-sided weakness or slurred speech  1. What are your last 5 BP readings? Sat-160/99 Sun-143/93 Mon-132/87  2. Are you having any other symptoms (ex. Dizziness, headache, blurred vision, passed out)? Pressure on her head, anxiety  3. What is your BP issue? Pt BP is running high, she states that it typically runs lower than the readings she has been getting.  She wanted to schedule an appt with someone. Scheduled for Tommye Standard on 10.21.21.

## 2020-08-31 NOTE — Telephone Encounter (Signed)
The patient started seeing blood pressure increase and it is concerning her. She starting seeing it in her core weight loss classes check-in. Advised her to reduce her salt in take, take her blood pressure two times a day (am/pm) and follow up as scheduled with Tommye Standard on 09/10/20 bringing  those reading with her.   The patient verbalized understanding.   Will forward to Tommye Standard for further advisement.

## 2020-09-09 NOTE — Progress Notes (Signed)
Cardiology Office Note Date:  09/10/2020  Patient ID:  Frances Smith 03-09-1946, MRN [id number] PCP:  Chesley Noon, MD  Cardiologist/Electrophysiologist:  Dr. Rayann Heman    Chief Complaint:  BP concerns  History of Present Illness: Frances Smith is a 74 y.o. female with history of PAFlutter/ablated, PAfib > ablated 1999 and 2020 , HLD, hypothyroidism, OSA w/CPAP  I saw her back in 2018  She is feeling very well.  In fact, states that with her PMD and the weight loss clinic there she has been started on Contrave and has lost 12 pounds, feels like she has increased energy, is exercising more and looking forward to a trip to [place] his summer.  She denies any CP or SOB, no exertional incapacities, again, feels like she can and is doing more.  She has infrequent palpitations, a couple weeks prior to the Contrave had a day with about 2 hours of palpitations, this occurs infrequently and not associated with any other symptoms.  No dizziness, near syncope or syncope.  No bleeding or signs of bleeding. She reports her lipids/labs are monitored by her PMD, she brings copy of her last lipid profile, HDL was 80, LDL was 70.  EKG noted stable intervals Labs updated, no changes were made  She comes in today to be seen for Dr. Rayann Heman, last seen by him via telehealth visit April this year.  Complants of knee pain with DJD and some venous insufficiency RLE as well. Felt to be doing well off AAD post ablation.  She was referred to Dr Linus Mako at [place] as per his discussion with Dr Melford Aase.  She saw [facility] so far no plans for surgical intervention, she is wearing support stockings  Most recently the patient called with some elevated BP readings and requested a visit.  TODAY she is doing well, exercising regularly with water aerobics when weather permits and recumbent elliptical machine (she has a bad knee and these work for that). No CP, SOB, unusual DOE> She has rare and  fleeting little palpitations only, no symptoms of recurrent Afib No dizzy spells ,near syncope or syncope.  She goes to a weight loss clinic and the last few times SBP has been 150's.  She is a retired Therapist, sports and started to monitor at home with both arm and wrist cuffs that usually correlated pretty well with each other and gets readings 110's-140's/70's Occassionally gets outlier low readings  At her last primary visit her BP was 150   She felt like her diet was pretty healy and well rounded though when looking with an eye towards salt noted pretty salt heavy.  Recently had house guest and let that slip again with observation of rise in her BP again.  She mentions her weight loss has plateaued and they are considering resuming Contrave  AFib Hx Diagnosed as far back as 1999 AFIB ablation 1999 in [place] and CTI ablation 07/19/2019, here, Dr. Rayann Heman AAD Propafenone goes as far back as 2010 >> stopped post ablation Nov 2020  Past Medical History:  Diagnosis Date  . Anal fissure   . Anxiety   . Arthritis   . Atrial fibrillation ([place])   . Colon polyps   . Depression   . Diverticulosis   . Dysrhythmia   . GERD (gastroesophageal reflux disease)   . H/O: hysterectomy   . Heart murmur   . History of bronchitis as a child   . History of head injury   .  History of measles   . History of mumps   . Hyperlipidemia   . IBS (irritable bowel syndrome)   . Menopause   . OSA on CPAP   . Osteopenia   . Paroxysmal atrial fibrillation (H[facility])   . Restless leg syndrome   . Thyroid disease     Past Surgical History:  Procedure Laterality Date  . ATRIAL FIBRILLATION ABLATION N/A 07/19/2019   Procedure: ATRIAL FIBRILLATION ABLATION;  Surgeon: Thompson Grayer, MD;  Location: [facility];  Service: Cardiovascular;  Laterality: N/A;  . BREAST CYST INCISION AND DRAINAGE Left 1985  . BREAST SURGERY     aspiration of cyst / left   . COLONOSCOPY  2010  . ENDOMETRIAL ABLATION    . PARTIAL  KNEE ARTHROPLASTY Left 08/10/2016   Procedure: LEFT KNEE UNICOMPARTMENTAL ARTHROPLASTY;  Surgeon: Gaynelle Arabian, MD;  [facility];  Service: Orthopedics;  Laterality: Left;  . TONSILLECTOMY AND ADENOIDECTOMY  1949  . TREATMENT FISTULA ANAL  1988  . VAGINAL HYSTERECTOMY  1984    Current Outpatient Medications  Medication Sig Dispense Refill  . acetaminophen (TYLENOL) 500 MG tablet Take 500 mg by mouth 3 (three) times daily as needed (for pain.).    Marland Kitchen b complex vitamins tablet Take 1 tablet by mouth daily.    . budesonide (ENTOCORT EC) 3 MG 24 hr capsule Take 3 capsules (9 mg total) by mouth daily. 270 capsule 3  . Cholecalciferol (D3-1000) 1000 units capsule Take 1,000 Units by mouth daily.    Marland Kitchen diltiazem (CARDIZEM) 30 MG tablet Take 1 tablet every 4 hours AS NEEDED for heart rate >100 as long as blood pressure >100. 60 tablet 1  . ELIQUIS 5 MG TABS tablet TAKE 1 TABLET TWICE A DAY 180 tablet 1  . famotidine (PEPCID) 40 MG tablet Take 1 tablet by mouth daily.    Marland Kitchen MAGNESIUM CHLORIDE-CALCIUM PO Take 1 tablet by mouth daily.    . Multiple Vitamins-Minerals (MULTIVITAMIN ADULT PO) Take by mouth daily.    . Naltrexone-buPROPion HCl ER 8-90 MG TB12 Take 2 tablets by mouth in the morning and at bedtime.    . pravastatin (PRAVACHOL) 40 MG tablet Take 40 mg by mouth daily.      Marland Kitchen rOPINIRole (REQUIP) 3 MG tablet Take 3 mg by mouth at bedtime.    . sertraline (ZOLOFT) 100 MG tablet Take 100 mg by mouth daily.     Marland Kitchen thyroid (ARMOUR) 30 MG tablet Take 30 mg by mouth daily before breakfast.     No current facility-administered medications for this visit.    Allergies:   Codeine   Social History:  The patient  reports that she quit smoking about 55 years ago. Her smoking use included cigarettes. She has a 2.50 pack-year smoking history. She has never used smokeless tobacco. She reports that she does not drink alcohol and does not use drugs.   Family History:  The patient's family history includes  Allergies in her mother; Anxiety disorder in her brother, daughter, and son; Asthma in her mother; Atrial fibrillation in her father; Breast cancer in her mother; Colon cancer (age of onset: 42) in her cousin; Colon cancer (age of onset: 36) in her maternal aunt; Depression in her daughter and son; Heart failure (age of onset: 11) in her father; Thyroid disease in her brother.  ROS:  Please see the history of present illness.   All other systems are reviewed and otherwise negative.   PHYSICAL EXAM:  VS:  Ht 5' 1.5" (1.562 m)   Wt 191 lb (86.6 kg)   BMI 35.50 kg/m  BMI: Body mass index is 35.5 kg/m. Well nourished, well developed, in no acute distress  HEENT: normocephalic, atraumatic  Neck: no JVD, carotid bruits or masses Cardiac:  RRR, no significant murmurs, no rubs, or gallops Lungs:  CTA b/l, no wheezing, rhonchi or rales  Abd: soft, nontender MS: no deformity or atrophy Ext: chronic skin changes RLE, varicosities noted, trace edema  Skin: warm and dry, no rash Neuro:  No gross deficits appreciated Psych: euthymic mood, full affect    EKG:  Done today and reviewed by myself:  SR 65bpm  07/19/2019: EPS/ablation, Dr. Rayann Heman CONCLUSIONS: 1. Sinus rhythm upon presentation.   2. Intracardiac echo reveals a moderate sized left atrium with four separate pulmonary veins without evidence of pulmonary vein stenosis. 3. Return of electrical conduction within all four PVs at baseline. 4, Successful electrical re-isolation and anatomical encircling of all four pulmonary veins with radiofrequency current.   A WACA approach was used 4. Cavo-tricuspid isthmus ablation was performed with complete bidirectional isthmus block achieved.  5. No inducible arrhythmias following ablation both on and off of Isuprel 6. No early apparent complications.   10/02/2018; TTE Study Conclusions  - Left ventricle: The cavity size was mildly reduced. Wall  thickness was increased in a pattern of mild  LVH. Systolic  function was normal. The estimated ejection fraction was in the  range of 60% to 65%. Wall motion was normal; there were no  regional wall motion abnormalities. Left ventricular diastolic  function parameters were normal for the patient&'s age.  - Aortic valve: There was no regurgitation.  - Mitral valve: There was trivial regurgitation.  - Left atrium: The atrium was mildly dilated.  - Right ventricle: Systolic function was normal.  - Atrial septum: No defect or patent foramen ovale was identified.  - Tricuspid valve: There was trivial regurgitation.  - Pulmonic valve: There was no significant regurgitation.   Impressions:  - Normal LV EF, no significant diastolic dysfunction. No  significant valvular abnormalities.    07/28/16: [facility] Call Study Highlights   Nuclear stress EF: 71%.  There was no ST segment deviation noted during stress.  The study is normal.  This is a low risk study.  The left ventricular ejection fraction is hyperdynamic (>65%).       09/21/11: stress myoview Overall Impression:  Normal stress nuclear study EF: 77% NL LV Function; NL Wall Motion   Recent Labs: No results found for requested labs within last 8760 hours.  No results found for requested labs within last 8760 hours.   CrCl cannot be calculated (Patient's most recent lab result is older than the maximum 21 days allowed.).   Wt Readings from Last 3 Encounters:  09/10/20 191 lb (86.6 kg)  03/16/20 186 lb 9.6 oz (84.6 kg)  10/21/19 199 lb (90.3 kg)     Other studies reviewed: Additional studies/records reviewed today include: summarized above  ASSESSMENT AND PLAN:   1. PAfib     CHA2DS2Vasc is  3 (4 if developed HTN), on Eliquis, appropriately dosed, reviewed PMHx, no changes     0%  Burden by symptoms  2. Some higher BPs intermittently at home/clinic      We discussed BP control strategies     She seems to have already established her BP salt  sensitive and working on salt reduction     She has several normotensive readings as well.  She will continue exercise, weight loss efforts and reduce her sodium intake  We discussed Contrave When I look, common reaction noted are increase in BP and HR, palpitations I have recommended she revisit medical weight loss strategies with her weit loss MD in regards to her h/o Afib and of late some higher BP readings.  She will continue to monitor her BP at home and with her PMD team and weight loss MD    Disposition:  She would like to have a 60mo follow up visit, she was to have seen Dr. Rayann Heman for a 1year post ablation follow up, will have her scheduled with him.  Current medicines are reviewed at length with the patient today.  The patient did not have any concerns regarding medicines.  Haywood Lasso, PA-C 09/10/2020 8:08 AM     [place] [address] [phone] (office)  [phone] (fax)

## 2020-09-10 ENCOUNTER — Other Ambulatory Visit: Payer: Self-pay

## 2020-09-10 ENCOUNTER — Ambulatory Visit (INDEPENDENT_AMBULATORY_CARE_PROVIDER_SITE_OTHER): Payer: Medicare Other | Admitting: Physician Assistant

## 2020-09-10 VITALS — BP 140/72 | HR 65 | Ht 61.5 in | Wt 191.0 lb

## 2020-09-10 DIAGNOSIS — I10 Essential (primary) hypertension: Secondary | ICD-10-CM | POA: Diagnosis not present

## 2020-09-10 DIAGNOSIS — I480 Paroxysmal atrial fibrillation: Secondary | ICD-10-CM

## 2020-09-10 NOTE — Patient Instructions (Signed)
Medication Instructions:  Your physician recommends that you continue on your current medications as directed. Please refer to the Current Medication list given to you today.  *If you need a refill on your cardiac medications before your next appointment, please call your pharmacy*   Lab Work: [place]   If you have labs (blood work) drawn today and your tests are completely normal, you will receive your results only by: Marland Kitchen MyChart Message (if you have MyChart) OR . A paper copy in the mail If you have any lab test that is abnormal or we need to change your treatment, we will call you to review the results.   Testing/Procedures: NONE ORDERED  TODAY    Follow-Up: At [facility], you and your health needs are our priority.  As part of our continuing mission to provide you with exceptional heart care, we have created designated Provider Care Teams.  These Care Teams include your primary Cardiologist (physician) and Advanced Practice Providers (APPs -  Physician Assistants and Nurse Practitioners) who all work together to provide you with the care you need, when you need it.  We recommend signing up for the patient portal called "MyChart".  Sign up information is provided on this After Visit Summary.  MyChart is used to connect with patients for Virtual Visits (Telemedicine).  Patients are able to view lab/test results, encounter notes, upcoming appointments, etc.  Non-urgent messages can be sent to your provider as well.   To learn more about what you can do with MyChart, go to [email or web address].    Your next appointment:   6 month(s)  The format for your next appointment:   In Person  Provider:   Thompson Grayer, MD   Other Instructions

## 2020-11-03 ENCOUNTER — Other Ambulatory Visit: Payer: Self-pay

## 2020-11-03 ENCOUNTER — Ambulatory Visit (HOSPITAL_COMMUNITY)
Admission: EM | Admit: 2020-11-03 | Discharge: 2020-11-03 | Disposition: A | Discharge status: Home / Self Care | Payer: Medicare Other

## 2020-11-03 ENCOUNTER — Encounter (HOSPITAL_COMMUNITY): Payer: Self-pay | Admitting: Emergency Medicine

## 2020-11-03 DIAGNOSIS — M79645 Pain in left finger(s): Secondary | ICD-10-CM | POA: Diagnosis not present

## 2020-11-03 DIAGNOSIS — S61211A Laceration without foreign body of left index finger without damage to nail, initial encounter: Secondary | ICD-10-CM

## 2020-11-03 MED ORDER — LIDOCAINE HCL 2 % IJ SOLN
INTRAMUSCULAR | Status: AC
  Filled 2020-11-03: qty 20

## 2020-11-03 NOTE — Discharge Instructions (Signed)

## 2020-11-03 NOTE — ED Triage Notes (Signed)
Patient was mixing batter, placed left index finger in beater and accidentally hit on button.  Partial avulsion to left index finger tip.  Bleeding is mild.    Last tetanus shot approx 3 years ago

## 2020-11-03 NOTE — ED Provider Notes (Signed)
[place]   MRN: [id number] DOB: 1946/06/01  Subjective:   Frances Smith is a 74 y.o. female presenting for suffering a left index finger laceration today while mixing the ladder.  Patient placed her finger into the mixer and accidentally pressed the button.  Her Tdap was updated about 3 years ago.  She did clean her wound multiple times prior to coming in.  No current facility-administered medications for this encounter.  Current Outpatient Medications:  .  acetaminophen (TYLENOL) 500 MG tablet, Take 500 mg by mouth 3 (three) times daily as needed (for pain.)., Disp: , Rfl:  .  b complex vitamins tablet, Take 1 tablet by mouth daily., Disp: , Rfl:  .  budesonide (ENTOCORT EC) 3 MG 24 hr capsule, Take 3 capsules (9 mg total) by mouth daily., Disp: 270 capsule, Rfl: 3 .  Cholecalciferol (D3-1000) 1000 units capsule, Take 1,000 Units by mouth daily., Disp: , Rfl:  .  diltiazem (CARDIZEM) 30 MG tablet, Take 1 tablet every 4 hours AS NEEDED for heart rate >100 as long as blood pressure >100., Disp: 60 tablet, Rfl: 1 .  ELIQUIS 5 MG TABS tablet, TAKE 1 TABLET TWICE A DAY, Disp: 180 tablet, Rfl: 1 .  famotidine (PEPCID) 40 MG tablet, Take 1 tablet by mouth daily., Disp: , Rfl:  .  MAGNESIUM CHLORIDE-CALCIUM PO, Take 1 tablet by mouth daily., Disp: , Rfl:  .  Multiple Vitamins-Minerals (MULTIVITAMIN ADULT PO), Take by mouth daily., Disp: , Rfl:  .  Naltrexone-buPROPion HCl ER 8-90 MG TB12, Take 2 tablets by mouth in the morning and at bedtime., Disp: , Rfl:  .  pravastatin (PRAVACHOL) 40 MG tablet, Take 40 mg by mouth daily.  , Disp: , Rfl:  .  rOPINIRole (REQUIP) 3 MG tablet, Take 3 mg by mouth at bedtime., Disp: , Rfl:  .  sertraline (ZOLOFT) 100 MG tablet, Take 100 mg by mouth daily. , Disp: , Rfl:  .  thyroid (ARMOUR) 30 MG tablet, Take 30 mg by mouth daily before breakfast., Disp: , Rfl:    Allergies  Allergen Reactions  . Codeine Itching, Rash and Hives    Past  Medical History:  Diagnosis Date  . Anal fissure   . Anxiety   . Arthritis   . Atrial fibrillation ([place])   . Colon polyps   . Depression   . Diverticulosis   . Dysrhythmia   . GERD (gastroesophageal reflux disease)   . H/O: hysterectomy   . Heart murmur   . History of bronchitis as a child   . History of head injury   . History of measles   . History of mumps   . Hyperlipidemia   . IBS (irritable bowel syndrome)   . Menopause   . OSA on CPAP   . Osteopenia   . Paroxysmal atrial fibrillation (HCC)   . Restless leg syndrome   . Thyroid disease      Past Surgical History:  Procedure Laterality Date  . ATRIAL FIBRILLATION ABLATION N/A 07/19/2019   Procedure: ATRIAL FIBRILLATION ABLATION;  Surgeon: Thompson Grayer, MD;  Location: [facility];  Service: Cardiovascular;  Laterality: N/A;  . BREAST CYST INCISION AND DRAINAGE Left 1985  . BREAST SURGERY     aspiration of cyst / left   . COLONOSCOPY  2010  . ENDOMETRIAL ABLATION    . PARTIAL KNEE ARTHROPLASTY Left 08/10/2016   Procedure: LEFT KNEE UNICOMPARTMENTAL ARTHROPLASTY;  Surgeon: Gaynelle Arabian, MD;  Location:  [facility];  Service: Orthopedics;  Laterality: Left;  . TONSILLECTOMY AND ADENOIDECTOMY  1949  . TREATMENT FISTULA ANAL  1988  . VAGINAL HYSTERECTOMY  1984    Family History  Problem Relation Age of Onset  . Heart failure Father 60  . Atrial fibrillation Father   . Allergies Mother   . Asthma Mother   . Breast cancer Mother   . Colon cancer Maternal Aunt 51  . Colon cancer Cousin 5  . Thyroid disease Brother   . Anxiety disorder Daughter   . Depression Daughter   . Anxiety disorder Son   . Depression Son   . Anxiety disorder Brother     Social History   Tobacco Use  . Smoking status: Former Smoker    Packs/day: 0.50    Years: 5.00    Pack years: 2.50    Types: Cigarettes    Quit date: 11/21/1964    Years since quitting: 55.9  . Smokeless tobacco: Never Used  Vaping Use  . Vaping Use: Never  used  Substance Use Topics  . Alcohol use: No  . Drug use: No    ROS   Objective:   Vitals: BP (!) 147/81 (BP Location: Left Arm)   Pulse 86   Temp 98.4 F (36.9 C) (Oral)   Resp 18   SpO2 98%   Physical Exam Constitutional:      General: She is not in acute distress.    Appearance: Normal appearance. She is well-developed. She is not ill-appearing, toxic-appearing or diaphoretic.  HENT:     Head: Normocephalic and atraumatic.     Nose: Nose normal.     Mouth/Throat:     Mouth: Mucous membranes are moist.     Pharynx: Oropharynx is clear.  Eyes:     General: No scleral icterus.    Extraocular Movements: Extraocular movements intact.     Pupils: Pupils are equal, round, and reactive to light.  Cardiovascular:     Rate and Rhythm: Normal rate.  Pulmonary:     Effort: Pulmonary effort is normal.  Musculoskeletal:       Hands:  Skin:    General: Skin is warm and dry.  Neurological:     General: No focal deficit present.     Mental Status: She is alert and oriented to person, place, and time.  Psychiatric:        Mood and Affect: Mood normal.        Behavior: Behavior normal.     PROCEDURE NOTE: laceration repair Verbal consent obtained from patient.  Local anesthesia with 2cc Lidocaine 2% without epinephrine.  Wound explored for tendon, ligament damage. Wound scrubbed with soap and water and rinsed. Wound closed with #3 6-0 Prolene (simple interrupted) sutures.  Wound cleansed and dressed.   Assessment and Plan :   PDMP not reviewed this encounter.  1. Finger pain, left   2. Laceration of left index finger without damage to nail, foreign body presence unspecified, initial encounter     Laceration repaired successfully. Wound care reviewed. Return-to-clinic precautions discussed, patient verbalized understanding. Otherwise, follow up in 7 to 10 days for suture removal.     Jaynee Eagles, PA-C 11/03/20 1928

## 2020-11-25 ENCOUNTER — Other Ambulatory Visit: Payer: Self-pay | Admitting: Internal Medicine

## 2020-11-25 NOTE — Telephone Encounter (Signed)
Eliquis 5mg  refill request received. Patient is 75 years old, weight-86.6kg, Crea-0.89 on 08/25/2020 via [facility] from [place] on 08/25/2020, Diagnosis-Afib, and last seen by 10/25/2020 on 09/10/2020. Dose is appropriate based on dosing criteria. Will send in refill to requested pharmacy.

## 2021-01-05 ENCOUNTER — Other Ambulatory Visit: Payer: Self-pay | Admitting: Family Medicine

## 2021-01-05 DIAGNOSIS — Z1231 Encounter for screening mammogram for malignant neoplasm of breast: Secondary | ICD-10-CM

## 2021-02-23 ENCOUNTER — Other Ambulatory Visit: Payer: Self-pay

## 2021-02-23 ENCOUNTER — Ambulatory Visit
Admission: RE | Admit: 2021-02-23 | Discharge: 2021-02-23 | Disposition: A | Discharge status: Home / Self Care | Payer: Medicare Other | Source: Ambulatory Visit | Attending: Family Medicine | Admitting: Family Medicine

## 2021-02-23 DIAGNOSIS — Z1231 Encounter for screening mammogram for malignant neoplasm of breast: Secondary | ICD-10-CM

## 2021-03-26 ENCOUNTER — Other Ambulatory Visit: Payer: Self-pay | Admitting: Family Medicine

## 2021-03-26 DIAGNOSIS — Z780 Asymptomatic menopausal state: Secondary | ICD-10-CM

## 2021-04-27 ENCOUNTER — Telehealth: Payer: Self-pay | Admitting: *Deleted

## 2021-04-27 NOTE — Telephone Encounter (Signed)
Patient with diagnosis of afib on Eliquis for anticoagulation.    Procedure: right TKA Date of procedure: 08/02/21  CHA2DS2-VASc Score = 4  This indicates a 4.8% annual risk of stroke. The patient's score is based upon: CHF History: No HTN History: Yes Diabetes History: No Stroke History: No Vascular Disease History: No Age Score: 2 Gender Score: 1   CrCl 58mL/min using adjusted body weight Platelet count 229K  Per office protocol, patient can hold Eliquis for 3 days prior to procedure.

## 2021-04-27 NOTE — Telephone Encounter (Signed)
   [facility] Pre-operative Risk Assessment    Patient Name: Frances Smith  DOB: 11-30-1945  MRN: [id number]   HEARTCARE STAFF: - Please ensure there is not already an duplicate clearance open for this procedure. - Under Visit Info/Reason for Call, type in Other and utilize the format Clearance MM/DD/YY or Clearance TBD. Do not use dashes or single digits. - If request is for dental extraction, please clarify the # of teeth to be extracted. - If the patient is currently at the dentist's office, call Pre-Op APP to address. If the patient is not currently in the dentist office, please route to the Pre-Op pool  Request for surgical clearance:  1. What type of surgery is being performed?  RIGHT TOTAL KNEE ARTHROPLASTY   2. When is this surgery scheduled?  08/02/21   3. What type of clearance is required (medical clearance vs. Pharmacy clearance to hold med vs. Both)?  BOTH  4. Are there any medications that need to be held prior to surgery and how long? ELIQUIS   5. Practice name and name of physician performing surgery?  [facility] / DR. Maureen Ralphs   6. What is the office phone number?  [phone]   7.   What is the office fax number?  [phone] ATTN:  KELLY  8.   Anesthesia type (None, local, MAC, general) ?  SPINAL   Jeanann Lewandowsky 04/27/2021, 10:55 AM  _________________________________________________________________   (provider comments below)

## 2021-04-27 NOTE — Telephone Encounter (Signed)
    Frances Smith DOB:  Dec 22, 1945  MRN:  [id number]   Primary Cardiologist: None  Chart reviewed as part of pre-operative protocol coverage. Given past medical history and time since last visit, based on ACC/AHA guidelines, NYISHA CLIPPARD would be at acceptable risk for the planned procedure without further cardiovascular testing.   Patient with diagnosis of afib on Eliquis for anticoagulation.    Procedure: right TKA Date of procedure: 08/02/21  CHA2DS2-VASc Score = 4  This indicates a 4.8% annual risk of stroke. The patient's score is based upon: CHF History: No HTN History: Yes Diabetes History: No Stroke History: No Vascular Disease History: No Age Score: 2 Gender Score: 1   CrCl 61mL/min using adjusted body weight Platelet count 229K  Per office protocol, patient can hold Eliquis for 3 days prior to procedure.   The patient was advised that if she develops new symptoms prior to surgery to contact our office to arrange for a follow-up visit, and she verbalized understanding.  I will route this recommendation to the requesting party via Epic fax function and remove from pre-op pool.  Please call with questions.  Kathyrn Drown, NP 04/27/2021, 1:24 PM

## 2021-05-12 ENCOUNTER — Emergency Department (HOSPITAL_BASED_OUTPATIENT_CLINIC_OR_DEPARTMENT_OTHER): Payer: Medicare Other

## 2021-05-12 ENCOUNTER — Encounter (HOSPITAL_BASED_OUTPATIENT_CLINIC_OR_DEPARTMENT_OTHER): Payer: Self-pay | Admitting: Obstetrics and Gynecology

## 2021-05-12 ENCOUNTER — Emergency Department (HOSPITAL_BASED_OUTPATIENT_CLINIC_OR_DEPARTMENT_OTHER)
Admission: EM | Admit: 2021-05-12 | Discharge: 2021-05-13 | Disposition: A | Discharge status: Home / Self Care | Payer: Medicare Other | Attending: Emergency Medicine | Admitting: Emergency Medicine

## 2021-05-12 ENCOUNTER — Other Ambulatory Visit: Payer: Self-pay

## 2021-05-12 DIAGNOSIS — W228XXA Striking against or struck by other objects, initial encounter: Secondary | ICD-10-CM | POA: Diagnosis not present

## 2021-05-12 DIAGNOSIS — Z7901 Long term (current) use of anticoagulants: Secondary | ICD-10-CM | POA: Insufficient documentation

## 2021-05-12 DIAGNOSIS — Z87891 Personal history of nicotine dependence: Secondary | ICD-10-CM | POA: Insufficient documentation

## 2021-05-12 DIAGNOSIS — Z8679 Personal history of other diseases of the circulatory system: Secondary | ICD-10-CM | POA: Insufficient documentation

## 2021-05-12 DIAGNOSIS — Z79899 Other long term (current) drug therapy: Secondary | ICD-10-CM | POA: Insufficient documentation

## 2021-05-12 DIAGNOSIS — E039 Hypothyroidism, unspecified: Secondary | ICD-10-CM | POA: Diagnosis not present

## 2021-05-12 DIAGNOSIS — Z96653 Presence of artificial knee joint, bilateral: Secondary | ICD-10-CM | POA: Insufficient documentation

## 2021-05-12 DIAGNOSIS — S60221A Contusion of right hand, initial encounter: Secondary | ICD-10-CM | POA: Diagnosis not present

## 2021-05-12 DIAGNOSIS — S6991XA Unspecified injury of right wrist, hand and finger(s), initial encounter: Secondary | ICD-10-CM | POA: Diagnosis present

## 2021-05-12 MED ORDER — ACETAMINOPHEN 500 MG PO TABS
1000.0000 mg | ORAL_TABLET | Freq: Once | ORAL | Status: AC
  Administered 2021-05-13: 1000 mg via ORAL
  Filled 2021-05-12: qty 2

## 2021-05-12 NOTE — ED Triage Notes (Signed)
Patient hit her left hand on a door hard and has an obvious area of swelling and discoloration. Patient also reports she was stung by multiple yellow jackets yesterday

## 2021-05-12 NOTE — ED Provider Notes (Signed)
[facility] EMERGENCY DEPT Provider Note  CSN: [id number] Arrival date & time: 05/12/21 1839  Chief Complaint(s) Hand Injury  HPI Frances Smith is a 75 y.o. female with a past medical history listed below including A. fib on Eliquis who presents to the emergency department for hand pain.  Patient reports hitting her hand on a metal doorknob around 3 PM this afternoon resulting in swelling over the dorsum of the first MCP.  Pain worse with palpation and range of motion of the hand.  Pain is throbbing.  Moderate to severe.  Alleviated by mobility.  Denies any other injuries.   Hand Injury  Past Medical History Past Medical History:  Diagnosis Date   Anal fissure    Anxiety    Arthritis    Atrial fibrillation ([place])    Colon polyps    Depression    Diverticulosis    Dysrhythmia    GERD (gastroesophageal reflux disease)    H/O: hysterectomy    Heart murmur    History of bronchitis as a child    History of head injury    History of measles    History of mumps    Hyperlipidemia    IBS (irritable bowel syndrome)    Menopause    OSA on CPAP    Osteopenia    Paroxysmal atrial fibrillation (HCC)    Restless leg syndrome    Thyroid disease    Patient Active Problem List   Diagnosis Date Noted   Paroxysmal atrial fibrillation ([place]) 07/19/2019   GERD (gastroesophageal reflux disease) 09/25/2018   Adhesive capsulitis of right shoulder 07/16/2018   Pain in joint of right shoulder 06/18/2018   BMI 34.0-34.9,adult 12/01/2016   Obesity, Class I, BMI 30-34.9 12/01/2016   History of right knee joint replacement 08/31/2016   Bilateral primary osteoarthritis of knee 02/23/2016   Hyperglycemia 10/14/2015   Primary osteoarthritis of knee 08/20/2015   Palpitation 04/16/2015   Primary insomnia 07/24/2014   Osteoporosis 02/27/2013   Generalized anxiety disorder 08/24/2012   Restless leg syndrome 07/30/2012   Knee pain, right 07/13/2012   OSA (obstructive sleep apnea)  07/04/2012   Benign colon polyp 11/25/2011   Osteopenia 11/25/2011   Hypothyroidism 10/18/2011   Chest pain 09/01/2011   Hyperlipidemia 02/02/2009   ATRIAL FIBRILLATION 02/02/2009   Home Medication(s) Prior to Admission medications   Medication Sig Start Date End Date Taking? Authorizing Provider  acetaminophen (TYLENOL) 500 MG tablet Take 500 mg by mouth 3 (three) times daily as needed (for pain.).    [provider]  b complex vitamins tablet Take 1 tablet by mouth daily.    [provider]  budesonide (ENTOCORT EC) 3 MG 24 hr capsule Take 3 capsules (9 mg total) by mouth daily. 12/06/18   Irene Shipper, MD  Cholecalciferol (D3-1000) 1000 units capsule Take 1,000 Units by mouth daily.    [provider]  diltiazem (CARDIZEM) 30 MG tablet Take 1 tablet every 4 hours AS NEEDED for heart rate >100 as long as blood pressure >100. 01/29/19   Allred, Jeneen Rinks, MD  ELIQUIS 5 MG TABS tablet TAKE 1 TABLET TWICE A DAY 11/25/20   Allred, Jeneen Rinks, MD  famotidine (PEPCID) 40 MG tablet Take 1 tablet by mouth daily. 02/20/20   [provider]  MAGNESIUM CHLORIDE-CALCIUM PO Take 1 tablet by mouth daily.    [provider]  Multiple Vitamins-Minerals (MULTIVITAMIN ADULT PO) Take by mouth daily.    [provider]  Naltrexone-buPROPion HCl ER 8-90  MG TB12 Take 2 tablets by mouth in the morning and at bedtime. 03/10/20   [provider]  pravastatin (PRAVACHOL) 40 MG tablet Take 40 mg by mouth daily.      [provider]  rOPINIRole (REQUIP) 3 MG tablet Take 3 mg by mouth at bedtime.    [provider]  sertraline (ZOLOFT) 100 MG tablet Take 100 mg by mouth daily.     [provider]  thyroid (ARMOUR) 30 MG tablet Take 30 mg by mouth daily before breakfast.    [provider]                                                                                                                                    Past Surgical  History Past Surgical History:  Procedure Laterality Date   ATRIAL FIBRILLATION ABLATION N/A 07/19/2019   Procedure: ATRIAL FIBRILLATION ABLATION;  Surgeon: Thompson Grayer, MD;  Location: [facility];  Service: Cardiovascular;  Laterality: N/A;   BREAST CYST ASPIRATION     multiple but not sure when   BREAST CYST INCISION AND DRAINAGE Left 1985   BREAST SURGERY     aspiration of cyst / left    COLONOSCOPY  2010   ENDOMETRIAL ABLATION     PARTIAL KNEE ARTHROPLASTY Left 08/10/2016   Procedure: LEFT KNEE UNICOMPARTMENTAL ARTHROPLASTY;  Surgeon: Gaynelle Arabian, MD;  Location: [facility];  Service: Orthopedics;  Laterality: Left;   TONSILLECTOMY AND ADENOIDECTOMY  1949   TREATMENT FISTULA ANAL  1988   VAGINAL HYSTERECTOMY  1984   Family History Family History  Problem Relation Age of Onset   Heart failure Father 68   Atrial fibrillation Father    Allergies Mother    Asthma Mother    Breast cancer Mother    Colon cancer Maternal Aunt 60   Breast cancer Maternal Aunt    Colon cancer Cousin 30   Thyroid disease Brother    Anxiety disorder Daughter    Depression Daughter    Anxiety disorder Son    Depression Son    Anxiety disorder Brother    Breast cancer Maternal Grandmother     Social History Social History   Tobacco Use   Smoking status: Former    Packs/day: 0.50    Years: 5.00    Pack years: 2.50    Types: Cigarettes    Quit date: 11/21/1964    Years since quitting: 56.5   Smokeless tobacco: Never  Vaping Use   Vaping Use: Never used  Substance Use Topics   Alcohol use: No   Drug use: No   Allergies Codeine  Review of Systems Review of Systems All other systems are reviewed and are negative for acute change except as noted in the HPI  Physical Exam Vital Signs  I have reviewed the triage vital signs BP (!) 162/67 (BP Location: Left Arm)   Pulse 73   Temp  98.9 F (37.2 C) (Oral)   Resp 18   SpO2 99%   Physical Exam Vitals reviewed.  Constitutional:       General: She is not in acute distress.    Appearance: She is well-developed. She is not diaphoretic.  HENT:     Head: Normocephalic and atraumatic.     Right Ear: External ear normal.     Left Ear: External ear normal.     Nose: Nose normal.  Eyes:     General: No scleral icterus.    Conjunctiva/sclera: Conjunctivae normal.  Neck:     Trachea: Phonation normal.  Cardiovascular:     Rate and Rhythm: Normal rate and regular rhythm.  Pulmonary:     Effort: Pulmonary effort is normal. No respiratory distress.     Breath sounds: No stridor.  Abdominal:     General: There is no distension.  Musculoskeletal:        General: Normal range of motion.     Right hand: Swelling and tenderness present. No lacerations. Normal strength. Normal sensation. Normal capillary refill. Normal pulse.       Hands:     Cervical back: Normal range of motion.  Neurological:     Mental Status: She is alert and oriented to person, place, and time.  Psychiatric:        Behavior: Behavior normal.    ED Results and Treatments Labs (all labs ordered are listed, but only abnormal results are displayed) Labs Reviewed - No data to display                                                                                                                       EKG  EKG Interpretation  Date/Time:    Ventricular Rate:    PR Interval:    QRS Duration:   QT Interval:    QTC Calculation:   R Axis:     Text Interpretation:          Radiology DG Hand Complete Right  Result Date: 05/12/2021 CLINICAL DATA:  Hand swelling injury EXAM: RIGHT HAND - COMPLETE 3+ VIEW COMPARISON:  None. FINDINGS: No malalignment. Lucent lesion at the base of the fifth middle phalanx with possible pathologic fracture deformity. Arthritis at the D IP joints. Diffuse soft tissue swelling. IMPRESSION: Possible acute fracture at the base of the fifth proximal phalanx through an underlying lucent bone lesion. Correlate for point  tenderness. Electronically Signed   By: Donavan Foil M.D.   On: 05/12/2021 19:26    Pertinent labs & imaging results that were available during my care of the patient were reviewed by me and considered in my medical decision making (see chart for details).  Medications Ordered in ED Medications  acetaminophen (TYLENOL) tablet 1,000 mg (has no administration in time range)  Procedures Procedures  (including critical care time)  Medical Decision Making / ED Course I have reviewed the nursing notes for this encounter and the patient's prior records (if available in EHR or on provided paperwork).   JOSELYNNE KILLAM was evaluated in Emergency Department on 05/13/2021 for the symptoms described in the history of present illness. She was evaluated in the context of the global COVID-19 pandemic, which necessitated consideration that the patient might be at risk for infection with the SARS-CoV-2 virus that causes COVID-19. Institutional protocols and algorithms that pertain to the evaluation of patients at risk for COVID-19 are in a state of rapid change based on information released by regulatory bodies including the CDC and federal and state organizations. These policies and algorithms were followed during the patient's care in the ED.  Right hand hematoma. Plain film negative for any fractures related to this incident.. Compression wrapping recommended. RICE recommended. Tylenol for pain.      Final Clinical Impression(s) / ED Diagnoses Final diagnoses:  Contusion of right hand, initial encounter   The patient appears reasonably screened and/or stabilized for discharge and I doubt any other medical condition or other [facility] requiring further screening, evaluation, or treatment in the ED at this time prior to discharge. Safe for discharge with strict return  precautions.  Disposition: Discharge  Condition: Good  I have discussed the results, Dx and Tx plan with the patient/family who expressed understanding and agree(s) with the plan. Discharge instructions discussed at length. The patient/family was given strict return precautions who verbalized understanding of the instructions. No further questions at time of discharge.    ED Discharge Orders     None         Follow Up: Chesley Noon, MD [place] [id number] [phone]  Call  as needed     This chart was dictated using voice recognition software.  Despite best efforts to proofread,  errors can occur which can change the documentation meaning.    Fatima Blank, MD 05/13/21 [phone]

## 2021-06-30 ENCOUNTER — Ambulatory Visit (INDEPENDENT_AMBULATORY_CARE_PROVIDER_SITE_OTHER): Payer: Medicare Other | Admitting: Internal Medicine

## 2021-06-30 ENCOUNTER — Encounter: Payer: Self-pay | Admitting: Internal Medicine

## 2021-06-30 ENCOUNTER — Other Ambulatory Visit: Payer: Self-pay

## 2021-06-30 VITALS — BP 132/78 | HR 68 | Ht 61.5 in | Wt 193.8 lb

## 2021-06-30 DIAGNOSIS — I480 Paroxysmal atrial fibrillation: Secondary | ICD-10-CM

## 2021-06-30 DIAGNOSIS — I872 Venous insufficiency (chronic) (peripheral): Secondary | ICD-10-CM

## 2021-06-30 DIAGNOSIS — G4733 Obstructive sleep apnea (adult) (pediatric): Secondary | ICD-10-CM

## 2021-06-30 NOTE — Progress Notes (Signed)
PCP: Chesley Noon, MD   Primary EP: Dr Joycelyn Man is a 75 y.o. female who presents today for routine electrophysiology followup.  Since last being seen in our clinic, the patient reports doing very well.  Today, she denies symptoms of palpitations, chest pain, shortness of breath,  lower extremity edema, dizziness, presyncope, or syncope.  The patient is otherwise without complaint today.   Past Medical History:  Diagnosis Date   Anal fissure    Anxiety    Arthritis    Atrial fibrillation (HCC)    Colon polyps    Depression    Diverticulosis    Dysrhythmia    GERD (gastroesophageal reflux disease)    H/O: hysterectomy    Heart murmur    History of bronchitis as a child    History of head injury    History of measles    History of mumps    Hyperlipidemia    IBS (irritable bowel syndrome)    Menopause    OSA on CPAP    Osteopenia    Paroxysmal atrial fibrillation (HCC)    Restless leg syndrome    Thyroid disease    Past Surgical History:  Procedure Laterality Date   ATRIAL FIBRILLATION ABLATION N/A 07/19/2019   Procedure: ATRIAL FIBRILLATION ABLATION;  Surgeon: Thompson Grayer, MD;  Location: [facility];  Service: Cardiovascular;  Laterality: N/A;   BREAST CYST ASPIRATION     multiple but not sure when   BREAST CYST INCISION AND DRAINAGE Left 1985   BREAST SURGERY     aspiration of cyst / left    COLONOSCOPY  2010   ENDOMETRIAL ABLATION     PARTIAL KNEE ARTHROPLASTY Left 08/10/2016   Procedure: LEFT KNEE UNICOMPARTMENTAL ARTHROPLASTY;  Surgeon: Gaynelle Arabian, MD;  Location: [facility];  Service: Orthopedics;  Laterality: Left;   TONSILLECTOMY AND ADENOIDECTOMY  1949   TREATMENT FISTULA ANAL  1988   VAGINAL HYSTERECTOMY  1984    ROS- all systems are reviewed and negatives except as per HPI above  Current Outpatient Medications  Medication Sig Dispense Refill   acetaminophen (TYLENOL) 500 MG tablet Take 500 mg by mouth 3 (three) times daily as  needed (for pain.).     b complex vitamins tablet Take 1 tablet by mouth daily.     budesonide (ENTOCORT EC) 3 MG 24 hr capsule Take 3 capsules (9 mg total) by mouth daily. 270 capsule 3   Cholecalciferol 25 MCG (1000 UT) capsule Take 1,000 Units by mouth daily.     DULoxetine (CYMBALTA) 60 MG capsule Take 60 mg by mouth daily.     ELIQUIS 5 MG TABS tablet TAKE 1 TABLET TWICE A DAY 180 tablet 2   famotidine (PEPCID) 40 MG tablet Take 1 tablet by mouth daily.     MAGNESIUM CHLORIDE-CALCIUM PO Take 1 tablet by mouth daily.     Multiple Vitamins-Minerals (MULTIVITAMIN ADULT PO) Take by mouth daily.     Naltrexone-buPROPion HCl ER 8-90 MG TB12 Take 2 tablets by mouth in the morning and at bedtime.     pravastatin (PRAVACHOL) 40 MG tablet Take 40 mg by mouth daily.       rOPINIRole (REQUIP) 3 MG tablet Take 3 mg by mouth at bedtime.     thyroid (ARMOUR) 30 MG tablet Take 30 mg by mouth daily before breakfast.     No current facility-administered medications for this visit.    Physical Exam: Vitals:   06/30/21 1440  BP: 132/78  Pulse: 68  SpO2: 96%  Weight: 193 lb 12.8 oz (87.9 kg)  Height: 5' 1.5" (1.562 m)    GEN- The patient is well appearing, alert and oriented x 3 today.   Head- normocephalic, atraumatic Eyes-  Sclera clear, conjunctiva pink Ears- hearing intact Oropharynx- clear Lungs- Clear to ausculation bilaterally, normal work of breathing Heart- Regular rate and rhythm, no murmurs, rubs or gallops, PMI not laterally displaced GI- soft, NT, ND, + BS Extremities- no clubbing, cyanosis, or edema  Wt Readings from Last 3 Encounters:  06/30/21 193 lb 12.8 oz (87.9 kg)  09/10/20 191 lb (86.6 kg)  03/16/20 186 lb 9.6 oz (84.6 kg)    EKG tracing ordered today is personally reviewed and shows sinus  Assessment and Plan:  Paroxysmal atrial fibrillation Doing well post ablation off AAD therapy Chads2vasc score is 3.  She is on eliquis  2. OSA Compliance with CPAP  advised  3. Overweight Body mass index is 36.03 kg/m. Lifestyle modification advised She continues to work on this  4. Preop Ok to proceed without further CV testing Hold eliquis 24-48 hours prior to the procedure and resume as soon as able after  Return in a year  Thompson Grayer MD, [facility] 06/30/2021 3:14 PM

## 2021-06-30 NOTE — Patient Instructions (Addendum)
Medication Instructions:  Your physician recommends that you continue on your current medications as directed. Please refer to the Current Medication list given to you today.  Labwork: None ordered.  Testing/Procedures: None ordered.  Follow-Up: Your physician wants you to follow-up in: 07/04/22 at 12:15 pm with Thompson Grayer, MD    Any Other Special Instructions Will Be Listed Below (If Applicable).  If you need a refill on your cardiac medications before your next appointment, please call your pharmacy.

## 2021-07-06 NOTE — H&P (Signed)
TOTAL KNEE ADMISSION H&P  Patient is being admitted for right total knee arthroplasty.  Subjective:  Chief Complaint: Right knee pain.  HPI: Frances Smith, 75 y.o. female has a history of pain and functional disability in the right knee due to arthritis and has failed non-surgical conservative treatments for greater than 12 weeks to include corticosteriod injections and activity modification. Onset of symptoms was gradual, starting >10 years ago with gradually worsening course since that time. The patient noted no past surgery on the right knee.  Patient currently rates pain in the right knee at 7 out of 10 with activity. Patient has night pain, worsening of pain with activity and weight bearing, pain with passive range of motion, and crepitus. Patient has evidence of  bone-on-bone arthritis in the medial and patellofemoral compartments  by imaging studies. There is no active infection.  Patient Active Problem List   Diagnosis Date Noted   Paroxysmal atrial fibrillation ([place]) 07/19/2019   GERD (gastroesophageal reflux disease) 09/25/2018   Adhesive capsulitis of right shoulder 07/16/2018   Pain in joint of right shoulder 06/18/2018   BMI 34.0-34.9,adult 12/01/2016   Obesity, Class I, BMI 30-34.9 12/01/2016   History of right knee joint replacement 08/31/2016   Bilateral primary osteoarthritis of knee 02/23/2016   Hyperglycemia 10/14/2015   Primary osteoarthritis of knee 08/20/2015   Palpitation 04/16/2015   Primary insomnia 07/24/2014   Osteoporosis 02/27/2013   Generalized anxiety disorder 08/24/2012   Restless leg syndrome 07/30/2012   Knee pain, right 07/13/2012   OSA (obstructive sleep apnea) 07/04/2012   Benign colon polyp 11/25/2011   Osteopenia 11/25/2011   Hypothyroidism 10/18/2011   Chest pain 09/01/2011   Hyperlipidemia 02/02/2009   ATRIAL FIBRILLATION 02/02/2009    Past Medical History:  Diagnosis Date   Anal fissure    Anxiety    Arthritis    Atrial fibrillation  (HCC)    Colon polyps    Depression    Diverticulosis    Dysrhythmia    GERD (gastroesophageal reflux disease)    H/O: hysterectomy    Heart murmur    History of bronchitis as a child    History of head injury    History of measles    History of mumps    Hyperlipidemia    IBS (irritable bowel syndrome)    Menopause    OSA on CPAP    Osteopenia    Paroxysmal atrial fibrillation (HCC)    Restless leg syndrome    Thyroid disease     Past Surgical History:  Procedure Laterality Date   ATRIAL FIBRILLATION ABLATION N/A 07/19/2019   Procedure: ATRIAL FIBRILLATION ABLATION;  Surgeon: Thompson Grayer, MD;  Location: [facility];  Service: Cardiovascular;  Laterality: N/A;   BREAST CYST ASPIRATION     multiple but not sure when   BREAST CYST INCISION AND DRAINAGE Left 1985   BREAST SURGERY     aspiration of cyst / left    COLONOSCOPY  2010   ENDOMETRIAL ABLATION     PARTIAL KNEE ARTHROPLASTY Left 08/10/2016   Procedure: LEFT KNEE UNICOMPARTMENTAL ARTHROPLASTY;  Surgeon: Gaynelle Arabian, MD;  Location: [facility];  Service: Orthopedics;  Laterality: Left;   TONSILLECTOMY AND ADENOIDECTOMY  1949   TREATMENT FISTULA ANAL  1988   VAGINAL HYSTERECTOMY  1984    Prior to Admission medications   Medication Sig Start Date End Date Taking? Authorizing Provider  acetaminophen (TYLENOL) 500 MG tablet Take 500 mg by mouth 3 (three) times daily as  needed (for pain.).    [provider]  b complex vitamins tablet Take 1 tablet by mouth daily.    [provider]  budesonide (ENTOCORT EC) 3 MG 24 hr capsule Take 3 capsules (9 mg total) by mouth daily. 12/06/18   Irene Shipper, MD  Cholecalciferol 25 MCG (1000 UT) capsule Take 1,000 Units by mouth daily.    [provider]  DULoxetine (CYMBALTA) 60 MG capsule Take 60 mg by mouth daily.    [provider]  ELIQUIS 5 MG TABS tablet TAKE 1 TABLET TWICE A DAY 11/25/20   Allred, Jeneen Rinks, MD  famotidine (PEPCID) 40 MG tablet  Take 1 tablet by mouth daily. 02/20/20   [provider]  MAGNESIUM CHLORIDE-CALCIUM PO Take 1 tablet by mouth daily.    [provider]  Multiple Vitamins-Minerals (MULTIVITAMIN ADULT PO) Take by mouth daily.    [provider]  Naltrexone-buPROPion HCl ER 8-90 MG TB12 Take 2 tablets by mouth in the morning and at bedtime. 03/10/20   [provider]  pravastatin (PRAVACHOL) 40 MG tablet Take 40 mg by mouth daily.      [provider]  rOPINIRole (REQUIP) 3 MG tablet Take 3 mg by mouth at bedtime.    [provider]  thyroid (ARMOUR) 30 MG tablet Take 30 mg by mouth daily before breakfast.    [provider]    Allergies  Allergen Reactions   Codeine Itching, Rash and Hives    Social History   Socioeconomic History   Marital status: Married    Spouse name: Not on file   Number of children: 2   Years of education: Not on file   Highest education level: Not on file  Occupational History   Occupation: retired    Comment: Therapist, sports, massage therapist  Tobacco Use   Smoking status: Former    Packs/day: 0.50    Years: 5.00    Pack years: 2.50    Types: Cigarettes    Quit date: 11/21/1964    Years since quitting: 24.6   Smokeless tobacco: Never  Vaping Use   Vaping Use: Never used  Substance and Sexual Activity   Alcohol use: No   Drug use: No   Sexual activity: Not on file  Other Topics Concern   Not on file  Social History Narrative   Not on file   Social Determinants of Health   Financial Resource Strain: Not on file  Food Insecurity: Not on file  Transportation Needs: Not on file  Physical Activity: Not on file  Stress: Not on file  Social Connections: Not on file  Intimate Partner Violence: Not on file    Tobacco Use: Medium Risk   Smoking Tobacco Use: Former   Smokeless Tobacco Use: Never   Social History   Substance and Sexual Activity  Alcohol Use No    Family History  Problem Relation Age of Onset    Heart failure Father 22   Atrial fibrillation Father    Allergies Mother    Asthma Mother    Breast cancer Mother    Colon cancer Maternal Aunt 91   Breast cancer Maternal Aunt    Colon cancer Cousin 69   Thyroid disease Brother    Anxiety disorder Daughter    Depression Daughter    Anxiety disorder Son    Depression Son    Anxiety disorder Brother    Breast cancer Maternal Grandmother     Review of Systems  Constitutional:  Negative for chills and fever.  HENT:  Negative for congestion, sore throat and tinnitus.   Eyes:  Negative for double vision, photophobia and pain.  Respiratory:  Negative for cough, shortness of breath and wheezing.   Cardiovascular:  Negative for chest pain, palpitations and orthopnea.  Gastrointestinal:  Negative for heartburn, nausea and vomiting.  Genitourinary:  Negative for dysuria, frequency and urgency.  Musculoskeletal:  Positive for joint pain.  Neurological:  Negative for dizziness, weakness and headaches.   Objective:  Physical Exam: Well nourished and well developed.  General: Alert and oriented x3, cooperative and pleasant, no acute distress.  Head: normocephalic, atraumatic, neck supple.  Eyes: EOMI.  Respiratory: breath sounds clear in all fields, no wheezing, rales, or rhonchi. Cardiovascular: Regular rate and rhythm, no murmurs, gallops or rubs.  Abdomen: non-tender to palpation and soft, normoactive bowel sounds. Musculoskeletal:   Right Knee Exam:   No effusion present. No swelling present.   The range of motion is: 5 to 125 degrees.   Positive crepitus on range of motion of the knee.   Positive medial greater than lateral joint line tenderness.   The knee is stable.   Venous stasis changes in the lower leg, but no ulceration.  Calves soft and nontender. Motor function intact in LE. Strength 5/5 LE bilaterally. Neuro: Distal pulses 2+. Sensation to light touch intact in LE.  Imaging Review Plain radiographs demonstrate  severe degenerative joint disease of the right knee. The overall alignment is neutral. The bone quality appears to be adequate for age and reported activity level.  Assessment/Plan:  End stage arthritis, right knee   The patient history, physical examination, clinical judgment of the provider and imaging studies are consistent with end stage degenerative joint disease of the right knee and total knee arthroplasty is deemed medically necessary. The treatment options including medical management, injection therapy arthroscopy and arthroplasty were discussed at length. The risks and benefits of total knee arthroplasty were presented and reviewed. The risks due to aseptic loosening, infection, stiffness, patella tracking problems, thromboembolic complications and other imponderables were discussed. The patient acknowledged the explanation, agreed to proceed with the plan and consent was signed. Patient is being admitted for inpatient treatment for surgery, pain control, PT, OT, prophylactic antibiotics, VTE prophylaxis, progressive ambulation and ADLs and discharge planning. The patient is planning to be discharged  home .   Patient's anticipated LOS is less than 2 midnights, meeting these requirements: - Younger than 61 - Lives within 1 hour of care - Has a competent adult at home to recover with post-op recover - NO history of  - Chronic pain requiring opiods  - Diabetes  - Coronary Artery Disease  - Heart failure  - Heart attack  - Stroke  - DVT/VTE  - Cardiac arrhythmia  - Respiratory Failure/COPD  - Renal failure  - Anemia  - Advanced Liver disease  Therapy Plans: Outpatient therapy at [facility] Disposition: Home with husband Planned DVT Prophylaxis: Eliquis 5 mg BID DME Needed: None PCP: Vicenta Aly, FNP  Cardiologist: Thompson Grayer, MD (clearance received) TXA: IV Allergies: Codeine (rash) Anesthesia Concerns: None BMI: 35.5 Last HgbA1c: Not diabetic Pharmacy: [facility]  Ricci Barker)  Other: - Discussed oxycodone postoperatively. Has tolerated hydrocodone previously.  - Patient was instructed on what medications to stop prior to surgery. - Follow-up visit in 2 weeks with Dr. Wynelle Link - Begin physical therapy following surgery - Pre-operative lab work as pre-surgical testing - Prescriptions will be provided in hospital at time of  discharge  Theresa Duty, PA-C Orthopedic Surgery [facility] [place]

## 2021-07-12 NOTE — Progress Notes (Signed)
DUE TO COVID-19 ONLY ONE VISITOR IS ALLOWED TO COME WITH YOU AND STAY IN THE WAITING ROOM ONLY DURING PRE OP AND PROCEDURE DAY OF SURGERY.  2 VISITOR  MAY VISIT WITH YOU AFTER SURGERY IN YOUR PRIVATE ROOM DURING VISITING HOURS ONLY!  YOU NEED TO HAVE A COVID 19 TEST ON___8/25/22 ___'@_'$  '@_from'$  8am-3pm _____, THIS TEST MUST BE DONE BEFORE SURGERY,  Covid test is done at [place], [place] [address].  This is a drive thru.  No appt required. Please see map.                 Your procedure is scheduled on:  07/19/21   Report to [facility] Main  Entrance   Report to admitting at     0550AM     Call this number if you have problems the morning of surgery [phone]    REMEMBER: NO  SOLID FOOD CANDY OR GUM AFTER MIDNIGHT. CLEAR LIQUIDS UNTIL   0520am         . NOTHING BY MOUTH EXCEPT CLEAR LIQUIDS UNTIL 0520am    . PLEASE FINISH ENSURE DRINK PER SURGEON ORDER  WHICH NEEDS TO BE COMPLETED AT   0520am    .      CLEAR LIQUID DIET   Foods Allowed                                                                    Coffee and tea, regular and decaf                            Fruit ices (not with fruit pulp)                                      Iced Popsicles                                    Carbonated beverages, regular and diet                                    Cranberry, grape and apple juices Sports drinks like Gatorade Lightly seasoned clear broth or consume(fat free) Sugar, honey syrup ___________________________________________________________________      BRUSH YOUR TEETH MORNING OF SURGERY AND RINSE YOUR MOUTH OUT, NO CHEWING GUM CANDY OR MINTS.     Take these medicines the morning of surgery with A SIP OF WATER:  magnesium, armour thyroid  DO NOT TAKE ANY DIABETIC MEDICATIONS DAY OF YOUR SURGERY                               You may not have any metal on your body including hair pins and              piercings  Do not wear jewelry, make-up, lotions,  powders or perfumes, deodorant  Do not wear nail polish on your fingernails.  Do not shave  48 hours prior to surgery.              Men may shave face and neck.   Do not bring valuables to the hospital. [place].  Contacts, dentures or bridgework may not be worn into surgery.  Leave suitcase in the car. After surgery it may be brought to your room.     Patients discharged the day of surgery will not be allowed to drive home. IF YOU ARE HAVING SURGERY AND GOING HOME THE SAME DAY, YOU MUST HAVE AN ADULT TO DRIVE YOU HOME AND BE WITH YOU FOR 24 HOURS. YOU MAY GO HOME BY TAXI OR UBER OR ORTHERWISE, BUT AN ADULT MUST ACCOMPANY YOU HOME AND STAY WITH YOU FOR 24 HOURS.  Name and phone number of your driver:  Special Instructions: N/A              Please read over the following fact sheets you were given: _____________________________________________________________________  [facility] - Preparing for Surgery Before surgery, you can play an important role.  Because skin is not sterile, your skin needs to be as free of germs as possible.  You can reduce the number of germs on your skin by washing with CHG (chlorahexidine gluconate) soap before surgery.  CHG is an antiseptic cleaner which kills germs and bonds with the skin to continue killing germs even after washing. Please DO NOT use if you have an allergy to CHG or antibacterial soaps.  If your skin becomes reddened/irritated stop using the CHG and inform your nurse when you arrive at Short Stay. Do not shave (including legs and underarms) for at least 48 hours prior to the first CHG shower.  You may shave your face/neck. Please follow these instructions carefully:  1.  Shower with CHG Soap the night before surgery and the  morning of Surgery.  2.  If you choose to wash your hair, wash your hair first as usual with your  normal  shampoo.  3.  After you shampoo, rinse your hair and body  thoroughly to remove the  shampoo.                           4.  Use CHG as you would any other liquid soap.  You can apply chg directly  to the skin and wash                       Gently with a scrungie or clean washcloth.  5.  Apply the CHG Soap to your body ONLY FROM THE NECK DOWN.   Do not use on face/ open                           Wound or open sores. Avoid contact with eyes, ears mouth and genitals (private parts).                       Wash face,  Genitals (private parts) with your normal soap.             6.  Wash thoroughly, paying special attention to the area where your surgery  will be performed.  7.  Thoroughly rinse your body with  warm water from the neck down.  8.  DO NOT shower/wash with your normal soap after using and rinsing off  the CHG Soap.                9.  Pat yourself dry with a clean towel.            10.  Wear clean pajamas.            11.  Place clean sheets on your bed the night of your first shower and do not  sleep with pets. Day of Surgery : Do not apply any lotions/deodorants the morning of surgery.  Please wear clean clothes to the hospital/surgery center.  FAILURE TO FOLLOW THESE INSTRUCTIONS MAY RESULT IN THE CANCELLATION OF YOUR SURGERY PATIENT SIGNATURE_________________________________  NURSE SIGNATURE__________________________________  ________________________________________________________________________

## 2021-07-13 NOTE — Progress Notes (Addendum)
Anesthesia Review:  PCP:     DR Melford Aase 07/09/21- Preop eval by Verne Grain  Clearance on chart dated 07/09/21.    Cardiologist :04/27/21- Sharee Pimple McDaniel,NP- Clearance on chart  09/10/20- LOV- Randell Loop Cardiologist- DR Thompson Grayer  Chest x-ray : 07/19/19- afib ablatoin  EKG :07/10/2021  Echo :2019  EP Study- 2020  CT Cors- 2020  Stress test: 2017  Cardiac Cath :  Activity level: can do a flight of stairs without difficulty  Sleep Study/ CPAP : has cpap  Fasting Blood Sugar :      / Checks Blood Sugar -- times a day:   Blood Thinner/ Instructions /Last Dose: ASA / Instructions/ Last Dose :   Eliquis - Stop 3 days prior to surgery per tp  Hgba1c-07/09/2021-5.5 Covid test 07/15/21  PT was 25 minutes late for preop appt.

## 2021-07-14 ENCOUNTER — Other Ambulatory Visit: Payer: Self-pay

## 2021-07-14 ENCOUNTER — Encounter (HOSPITAL_COMMUNITY): Payer: Self-pay

## 2021-07-14 ENCOUNTER — Encounter (HOSPITAL_COMMUNITY)
Admission: RE | Admit: 2021-07-14 | Discharge: 2021-07-14 | Disposition: A | Discharge status: Home / Self Care | Payer: Medicare Other | Source: Ambulatory Visit | Attending: Orthopedic Surgery | Admitting: Orthopedic Surgery

## 2021-07-14 DIAGNOSIS — Z01812 Encounter for preprocedural laboratory examination: Secondary | ICD-10-CM | POA: Insufficient documentation

## 2021-07-14 HISTORY — DX: Venous insufficiency (chronic) (peripheral): I87.2

## 2021-07-14 HISTORY — DX: Hypothyroidism, unspecified: E03.9

## 2021-07-14 LAB — CBC
HCT: 45.1 % (ref 36.0–46.0)
Hemoglobin: 14.7 g/dL (ref 12.0–15.0)
MCH: 30.3 pg (ref 26.0–34.0)
MCHC: 32.6 g/dL (ref 30.0–36.0)
MCV: 93 fL (ref 80.0–100.0)
Platelets: 221 10*3/uL (ref 150–400)
RBC: 4.85 MIL/uL (ref 3.87–5.11)
RDW: 13.7 % (ref 11.5–15.5)
WBC: 4.9 10*3/uL (ref 4.0–10.5)
nRBC: 0 % (ref 0.0–0.2)

## 2021-07-14 LAB — COMPREHENSIVE METABOLIC PANEL
ALT: 23 U/L (ref 0–44)
AST: 20 U/L (ref 15–41)
Albumin: 4.8 g/dL (ref 3.5–5.0)
Alkaline Phosphatase: 51 U/L (ref 38–126)
Anion gap: 10 (ref 5–15)
BUN: 23 mg/dL (ref 8–23)
CO2: 27 mmol/L (ref 22–32)
Calcium: 9.6 mg/dL (ref 8.9–10.3)
Chloride: 108 mmol/L (ref 98–111)
Creatinine, Ser: 0.98 mg/dL (ref 0.44–1.00)
GFR, Estimated: 59 mL/min — ABNORMAL LOW (ref >60)
Glucose, Bld: 94 mg/dL (ref 70–99)
Potassium: 4.1 mmol/L (ref 3.5–5.1)
Sodium: 145 mmol/L (ref 135–145)
Total Bilirubin: 0.7 mg/dL (ref 0.3–1.2)
Total Protein: 7.4 g/dL (ref 6.5–8.1)

## 2021-07-14 LAB — PROTIME-INR
INR: 1.1 (ref 0.8–1.2)
Prothrombin Time: 13.9 seconds (ref 11.4–15.2)

## 2021-07-14 LAB — SURGICAL PCR SCREEN
MRSA, PCR: NEGATIVE
Staphylococcus aureus: NEGATIVE

## 2021-07-15 ENCOUNTER — Other Ambulatory Visit: Payer: Self-pay | Admitting: Orthopedic Surgery

## 2021-07-15 LAB — SARS CORONAVIRUS 2 (TAT 6-24 HRS): SARS Coronavirus 2: NEGATIVE

## 2021-07-15 NOTE — Anesthesia Preprocedure Evaluation (Addendum)
Anesthesia Evaluation  Patient identified by MRN, date of birth, ID band Patient awake    Reviewed: Allergy & Precautions, NPO status , Patient's Chart, lab work & pertinent test results, reviewed documented beta blocker date and time   Airway Mallampati: II  TM Distance: >3 FB Neck ROM: Full    Dental no notable dental hx. (+) Teeth Intact, Dental Advisory Given   Pulmonary sleep apnea and Continuous Positive Airway Pressure Ventilation , former smoker,    Pulmonary exam normal breath sounds clear to auscultation       Cardiovascular Normal cardiovascular exam+ dysrhythmias Atrial Fibrillation + Valvular Problems/Murmurs  Rhythm:Regular Rate:Normal  S/P atrial fibrillation ablation 2020  EKG 06/30/21 NSR, normal    Neuro/Psych PSYCHIATRIC DISORDERS Anxiety Depression Restless legs syndrome Peripheral neuropathy left leg    GI/Hepatic Neg liver ROS, GERD  Medicated and Controlled,Hx/o IBS Hx/o Diverticulosis   Endo/Other  Hypothyroidism Obesity Hyperlipidemia  Renal/GU   negative genitourinary   Musculoskeletal  (+) Arthritis , Osteoarthritis,  OA right knee S/P unicompartment right knee replacement   Abdominal (+) + obese,   Peds  Hematology Eliquis therapy- last dose 8/25 pm   Anesthesia Other Findings   Reproductive/Obstetrics                           Anesthesia Physical Anesthesia Plan  ASA: 2  Anesthesia Plan: Spinal   Post-op Pain Management:  [facility] for Post-op pain   Induction: Intravenous  PONV Risk Score and Plan: 4 or greater and Treatment may vary due to age or medical condition, Ondansetron, Propofol infusion and Dexamethasone  Airway Management Planned: Natural Airway and Simple Face Mask  Additional Equipment:   Intra-op Plan:   Post-operative Plan:   Informed Consent: I have reviewed the patients History and Physical, chart, labs and discussed the procedure  including the risks, benefits and alternatives for the proposed anesthesia with the patient or authorized representative who has indicated his/her understanding and acceptance.     Dental advisory given  Plan Discussed with: CRNA and Anesthesiologist  Anesthesia Plan Comments: ( Per cardiology note 04/27/21, "Chart reviewed as part of pre-operative protocol coverage. Given past medical history and time since last visit, based on ACC/AHA guidelines, MARAYA OLEKSAK would be at acceptable risk for the planned procedure without further cardiovascular testing.   Patient with diagnosis ofafibon Eliquisfor anticoagulation.   Procedure:right TKA Date of procedure:08/02/21  Per office protocol, patient can holdEliquisfor 3days prior to procedure")       Anesthesia Quick Evaluation

## 2021-07-18 MED ORDER — BUPIVACAINE LIPOSOME 1.3 % IJ SUSP
20.0000 mL | Freq: Once | INTRAMUSCULAR | Status: DC
  Filled 2021-07-18: qty 20

## 2021-07-19 ENCOUNTER — Observation Stay (HOSPITAL_COMMUNITY)
Admission: RE | Admit: 2021-07-19 | Discharge: 2021-07-20 | Disposition: A | Discharge status: Home / Self Care | Payer: Medicare Other | Source: Ambulatory Visit | Attending: Orthopedic Surgery | Admitting: Orthopedic Surgery

## 2021-07-19 ENCOUNTER — Other Ambulatory Visit: Payer: Self-pay

## 2021-07-19 ENCOUNTER — Encounter (HOSPITAL_COMMUNITY): Payer: Self-pay | Admitting: Orthopedic Surgery

## 2021-07-19 ENCOUNTER — Ambulatory Visit (HOSPITAL_COMMUNITY): Payer: Medicare Other | Admitting: Anesthesiology

## 2021-07-19 ENCOUNTER — Encounter (HOSPITAL_COMMUNITY)
Admission: RE | Disposition: A | Discharge status: Home / Self Care | Payer: Self-pay | Source: Ambulatory Visit | Attending: Orthopedic Surgery

## 2021-07-19 DIAGNOSIS — Z87891 Personal history of nicotine dependence: Secondary | ICD-10-CM | POA: Diagnosis not present

## 2021-07-19 DIAGNOSIS — I480 Paroxysmal atrial fibrillation: Secondary | ICD-10-CM | POA: Insufficient documentation

## 2021-07-19 DIAGNOSIS — E039 Hypothyroidism, unspecified: Secondary | ICD-10-CM | POA: Insufficient documentation

## 2021-07-19 DIAGNOSIS — Z7901 Long term (current) use of anticoagulants: Secondary | ICD-10-CM | POA: Diagnosis not present

## 2021-07-19 DIAGNOSIS — Z96652 Presence of left artificial knee joint: Secondary | ICD-10-CM | POA: Diagnosis not present

## 2021-07-19 DIAGNOSIS — M1711 Unilateral primary osteoarthritis, right knee: Secondary | ICD-10-CM | POA: Diagnosis present

## 2021-07-19 DIAGNOSIS — M1710 Unilateral primary osteoarthritis, unspecified knee: Secondary | ICD-10-CM | POA: Diagnosis present

## 2021-07-19 DIAGNOSIS — Z79899 Other long term (current) drug therapy: Secondary | ICD-10-CM | POA: Insufficient documentation

## 2021-07-19 HISTORY — PX: TOTAL KNEE ARTHROPLASTY: SHX125

## 2021-07-19 SURGERY — ARTHROPLASTY, KNEE, TOTAL
Anesthesia: Spinal | Site: Knee | Laterality: Right

## 2021-07-19 MED ORDER — BISACODYL 10 MG RE SUPP: 10.0000 mg | Freq: Every day | RECTAL | Status: DC | PRN

## 2021-07-19 MED ORDER — PROPOFOL 10 MG/ML IV BOLUS
INTRAVENOUS | Status: DC | PRN
  Administered 2021-07-19 (×3): 30 mg via INTRAVENOUS
  Administered 2021-07-19: 20 mg via INTRAVENOUS

## 2021-07-19 MED ORDER — PRAVASTATIN SODIUM 20 MG PO TABS
40.0000 mg | ORAL_TABLET | Freq: Every day | ORAL | Status: DC
  Administered 2021-07-20: 40 mg via ORAL
  Filled 2021-07-19: qty 2

## 2021-07-19 MED ORDER — METHOCARBAMOL 500 MG PO TABS
500.0000 mg | ORAL_TABLET | Freq: Four times a day (QID) | ORAL | Status: DC | PRN
  Administered 2021-07-19 – 2021-07-20 (×4): 500 mg via ORAL
  Filled 2021-07-19 (×4): qty 1

## 2021-07-19 MED ORDER — DOCUSATE SODIUM 100 MG PO CAPS
100.0000 mg | ORAL_CAPSULE | Freq: Two times a day (BID) | ORAL | Status: DC
  Administered 2021-07-19 – 2021-07-20 (×2): 100 mg via ORAL
  Filled 2021-07-19 (×2): qty 1

## 2021-07-19 MED ORDER — MENTHOL 3 MG MT LOZG: 1.0000 | LOZENGE | OROMUCOSAL | Status: DC | PRN

## 2021-07-19 MED ORDER — SODIUM CHLORIDE (PF) 0.9 % IJ SOLN
INTRAMUSCULAR | Status: DC | PRN
  Administered 2021-07-19: 60 mL

## 2021-07-19 MED ORDER — OXYCODONE HCL 5 MG PO TABS: 5.0000 mg | ORAL_TABLET | ORAL | Status: DC | PRN

## 2021-07-19 MED ORDER — SODIUM CHLORIDE 0.9 % IR SOLN
Status: DC | PRN
  Administered 2021-07-19: 1000 mL

## 2021-07-19 MED ORDER — DULOXETINE HCL 60 MG PO CPEP
60.0000 mg | ORAL_CAPSULE | Freq: Every day | ORAL | Status: DC
  Administered 2021-07-20: 60 mg via ORAL
  Filled 2021-07-19: qty 1

## 2021-07-19 MED ORDER — CEFAZOLIN SODIUM-DEXTROSE 2-4 GM/100ML-% IV SOLN
2.0000 g | Freq: Four times a day (QID) | INTRAVENOUS | Status: AC
  Administered 2021-07-19 (×2): 2 g via INTRAVENOUS
  Filled 2021-07-19 (×2): qty 100

## 2021-07-19 MED ORDER — PROPOFOL 500 MG/50ML IV EMUL
INTRAVENOUS | Status: AC
  Filled 2021-07-19: qty 50

## 2021-07-19 MED ORDER — 0.9 % SODIUM CHLORIDE (POUR BTL) OPTIME
TOPICAL | Status: DC | PRN
  Administered 2021-07-19: 1000 mL

## 2021-07-19 MED ORDER — DEXAMETHASONE SODIUM PHOSPHATE 10 MG/ML IJ SOLN
10.0000 mg | Freq: Once | INTRAMUSCULAR | Status: AC
  Administered 2021-07-20: 10 mg via INTRAVENOUS
  Filled 2021-07-19: qty 1

## 2021-07-19 MED ORDER — STERILE WATER FOR IRRIGATION IR SOLN
Status: DC | PRN
  Administered 2021-07-19: 2000 mL

## 2021-07-19 MED ORDER — ONDANSETRON HCL 4 MG/2ML IJ SOLN
INTRAMUSCULAR | Status: AC
  Filled 2021-07-19: qty 2

## 2021-07-19 MED ORDER — EPHEDRINE SULFATE-NACL 50-0.9 MG/10ML-% IV SOSY
PREFILLED_SYRINGE | INTRAVENOUS | Status: DC | PRN
  Administered 2021-07-19: 10 mg via INTRAVENOUS

## 2021-07-19 MED ORDER — ACETAMINOPHEN 10 MG/ML IV SOLN
1000.0000 mg | Freq: Four times a day (QID) | INTRAVENOUS | Status: DC
  Administered 2021-07-19: 1000 mg via INTRAVENOUS
  Filled 2021-07-19: qty 100

## 2021-07-19 MED ORDER — SODIUM CHLORIDE 0.9 % IV SOLN: INTRAVENOUS | Status: DC

## 2021-07-19 MED ORDER — THYROID 30 MG PO TABS
30.0000 mg | ORAL_TABLET | Freq: Every day | ORAL | Status: DC
  Administered 2021-07-20: 30 mg via ORAL
  Filled 2021-07-19: qty 1

## 2021-07-19 MED ORDER — FAMOTIDINE 20 MG PO TABS
40.0000 mg | ORAL_TABLET | Freq: Two times a day (BID) | ORAL | Status: DC
  Administered 2021-07-19 – 2021-07-20 (×2): 40 mg via ORAL
  Filled 2021-07-19 (×2): qty 2

## 2021-07-19 MED ORDER — MIDAZOLAM HCL 2 MG/2ML IJ SOLN
INTRAMUSCULAR | Status: AC
  Administered 2021-07-19: 1 mg via INTRAVENOUS
  Filled 2021-07-19: qty 2

## 2021-07-19 MED ORDER — DEXAMETHASONE SODIUM PHOSPHATE 10 MG/ML IJ SOLN
INTRAMUSCULAR | Status: AC
  Filled 2021-07-19: qty 1

## 2021-07-19 MED ORDER — ACETAMINOPHEN 500 MG PO TABS
1000.0000 mg | ORAL_TABLET | Freq: Four times a day (QID) | ORAL | Status: AC
  Administered 2021-07-19 (×2): 1000 mg via ORAL
  Administered 2021-07-20: 500 mg via ORAL
  Administered 2021-07-20: 1000 mg via ORAL
  Filled 2021-07-19 (×4): qty 2

## 2021-07-19 MED ORDER — PROPOFOL 10 MG/ML IV BOLUS
INTRAVENOUS | Status: AC
  Filled 2021-07-19: qty 20

## 2021-07-19 MED ORDER — DEXAMETHASONE SODIUM PHOSPHATE 10 MG/ML IJ SOLN
8.0000 mg | Freq: Once | INTRAMUSCULAR | Status: AC
  Administered 2021-07-19: 8 mg via INTRAVENOUS

## 2021-07-19 MED ORDER — TRANEXAMIC ACID-NACL 1000-0.7 MG/100ML-% IV SOLN
1000.0000 mg | INTRAVENOUS | Status: AC
  Administered 2021-07-19: 1000 mg via INTRAVENOUS
  Filled 2021-07-19: qty 100

## 2021-07-19 MED ORDER — FENTANYL CITRATE PF 50 MCG/ML IJ SOSY
PREFILLED_SYRINGE | INTRAMUSCULAR | Status: AC
  Administered 2021-07-19: 50 ug via INTRAVENOUS
  Filled 2021-07-19: qty 1

## 2021-07-19 MED ORDER — GABAPENTIN 300 MG PO CAPS: 300.0000 mg | ORAL_CAPSULE | Freq: Every evening | ORAL | Status: DC | PRN

## 2021-07-19 MED ORDER — HYDROMORPHONE HCL 1 MG/ML IJ SOLN: 0.2500 mg | INTRAMUSCULAR | Status: DC | PRN

## 2021-07-19 MED ORDER — CEFAZOLIN SODIUM-DEXTROSE 2-4 GM/100ML-% IV SOLN
2.0000 g | INTRAVENOUS | Status: AC
  Administered 2021-07-19: 2 g via INTRAVENOUS
  Filled 2021-07-19: qty 100

## 2021-07-19 MED ORDER — MORPHINE SULFATE (PF) 2 MG/ML IV SOLN
0.5000 mg | INTRAVENOUS | Status: DC | PRN
  Administered 2021-07-19 – 2021-07-20 (×2): 1 mg via INTRAVENOUS
  Filled 2021-07-19 (×2): qty 1

## 2021-07-19 MED ORDER — APIXABAN 2.5 MG PO TABS
2.5000 mg | ORAL_TABLET | Freq: Two times a day (BID) | ORAL | Status: DC
  Administered 2021-07-20: 2.5 mg via ORAL
  Filled 2021-07-19: qty 1

## 2021-07-19 MED ORDER — BUPIVACAINE LIPOSOME 1.3 % IJ SUSP
INTRAMUSCULAR | Status: DC | PRN
  Administered 2021-07-19: 20 mL

## 2021-07-19 MED ORDER — CEFAZOLIN SODIUM-DEXTROSE 2-4 GM/100ML-% IV SOLN: 2.0000 g | Freq: Four times a day (QID) | INTRAVENOUS | Status: DC

## 2021-07-19 MED ORDER — PROPOFOL 500 MG/50ML IV EMUL
INTRAVENOUS | Status: DC | PRN
  Administered 2021-07-19: 75 ug/kg/min via INTRAVENOUS

## 2021-07-19 MED ORDER — ONDANSETRON HCL 4 MG/2ML IJ SOLN: 4.0000 mg | Freq: Four times a day (QID) | INTRAMUSCULAR | Status: DC | PRN

## 2021-07-19 MED ORDER — OXYCODONE HCL 5 MG PO TABS
10.0000 mg | ORAL_TABLET | ORAL | Status: DC | PRN
  Administered 2021-07-19 – 2021-07-20 (×5): 10 mg via ORAL
  Filled 2021-07-19 (×6): qty 2

## 2021-07-19 MED ORDER — FLEET ENEMA 7-19 GM/118ML RE ENEM: 1.0000 | ENEMA | Freq: Once | RECTAL | Status: DC | PRN

## 2021-07-19 MED ORDER — FENTANYL CITRATE PF 50 MCG/ML IJ SOSY
50.0000 ug | PREFILLED_SYRINGE | INTRAMUSCULAR | Status: DC
Start: 2021-07-19 — End: 2021-07-19
  Filled 2021-07-19: qty 1

## 2021-07-19 MED ORDER — EPHEDRINE 5 MG/ML INJ
INTRAVENOUS | Status: AC
  Filled 2021-07-19: qty 5

## 2021-07-19 MED ORDER — LACTATED RINGERS IV SOLN: INTRAVENOUS | Status: DC

## 2021-07-19 MED ORDER — ROPIVACAINE HCL 5 MG/ML IJ SOLN
INTRAMUSCULAR | Status: DC | PRN
  Administered 2021-07-19: 30 mL via PERINEURAL

## 2021-07-19 MED ORDER — DIPHENHYDRAMINE HCL 12.5 MG/5ML PO ELIX: 12.5000 mg | ORAL_SOLUTION | ORAL | Status: DC | PRN

## 2021-07-19 MED ORDER — METHOCARBAMOL 1000 MG/10ML IJ SOLN
500.0000 mg | Freq: Four times a day (QID) | INTRAVENOUS | Status: DC | PRN
  Filled 2021-07-19: qty 5

## 2021-07-19 MED ORDER — POLYETHYLENE GLYCOL 3350 17 G PO PACK: 17.0000 g | PACK | Freq: Every day | ORAL | Status: DC | PRN

## 2021-07-19 MED ORDER — POVIDONE-IODINE 10 % EX SWAB
2.0000 "application " | Freq: Once | CUTANEOUS | Status: AC
  Administered 2021-07-19: 2 via TOPICAL

## 2021-07-19 MED ORDER — SODIUM CHLORIDE (PF) 0.9 % IJ SOLN
INTRAMUSCULAR | Status: AC
  Filled 2021-07-19: qty 10

## 2021-07-19 MED ORDER — MIDAZOLAM HCL 2 MG/2ML IJ SOLN: 1.0000 mg | INTRAMUSCULAR | Status: DC

## 2021-07-19 MED ORDER — PHENOL 1.4 % MT LIQD: 1.0000 | OROMUCOSAL | Status: DC | PRN

## 2021-07-19 MED ORDER — METOCLOPRAMIDE HCL 5 MG PO TABS: 5.0000 mg | ORAL_TABLET | Freq: Three times a day (TID) | ORAL | Status: DC | PRN

## 2021-07-19 MED ORDER — ONDANSETRON HCL 4 MG/2ML IJ SOLN: 4.0000 mg | Freq: Once | INTRAMUSCULAR | Status: DC | PRN

## 2021-07-19 MED ORDER — METOCLOPRAMIDE HCL 5 MG/ML IJ SOLN: 5.0000 mg | Freq: Three times a day (TID) | INTRAMUSCULAR | Status: DC | PRN

## 2021-07-19 MED ORDER — TRAZODONE HCL 50 MG PO TABS
50.0000 mg | ORAL_TABLET | Freq: Every evening | ORAL | Status: DC | PRN
  Administered 2021-07-19: 50 mg via ORAL
  Filled 2021-07-19: qty 1

## 2021-07-19 MED ORDER — ONDANSETRON HCL 4 MG/2ML IJ SOLN
INTRAMUSCULAR | Status: DC | PRN
  Administered 2021-07-19: 4 mg via INTRAVENOUS

## 2021-07-19 MED ORDER — BUPIVACAINE IN DEXTROSE 0.75-8.25 % IT SOLN
INTRATHECAL | Status: DC | PRN
  Administered 2021-07-19: 1.6 mL via INTRATHECAL

## 2021-07-19 MED ORDER — ONDANSETRON HCL 4 MG PO TABS: 4.0000 mg | ORAL_TABLET | Freq: Four times a day (QID) | ORAL | Status: DC | PRN

## 2021-07-19 SURGICAL SUPPLY — 53 items
BAG COUNTER SPONGE SURGICOUNT (BAG) IMPLANT
BAG ZIPLOCK 12X15 (MISCELLANEOUS) ×2 IMPLANT
BLADE SAG 18X100X1.27 (BLADE) ×2 IMPLANT
BLADE SAW SGTL 11.0X1.19X90.0M (BLADE) ×2 IMPLANT
BNDG ELASTIC 6X5.8 VLCR STR LF (GAUZE/BANDAGES/DRESSINGS) ×2 IMPLANT
BOWL SMART MIX CTS (DISPOSABLE) ×2 IMPLANT
CEMENT HV SMART SET (Cement) ×4 IMPLANT
CEMENT TIBIA MBT SIZE 2.5 (Knees) ×1 IMPLANT
COVER SURGICAL LIGHT HANDLE (MISCELLANEOUS) ×2 IMPLANT
CUFF TOURN SGL QUICK 34 (TOURNIQUET CUFF) ×1
CUFF TRNQT CYL 34X4.125X (TOURNIQUET CUFF) ×1 IMPLANT
DECANTER SPIKE VIAL GLASS SM (MISCELLANEOUS) ×2 IMPLANT
DRAPE U-SHAPE 47X51 STRL (DRAPES) ×2 IMPLANT
DRSG AQUACEL AG ADV 3.5X10 (GAUZE/BANDAGES/DRESSINGS) ×2 IMPLANT
DURAPREP 26ML APPLICATOR (WOUND CARE) ×2 IMPLANT
ELECT REM PT RETURN 15FT ADLT (MISCELLANEOUS) ×2 IMPLANT
FEMUR SIGMA PS SZ 3.0 R (Femur) ×2 IMPLANT
GLOVE SRG 8 PF TXTR STRL LF DI (GLOVE) ×1 IMPLANT
GLOVE SURG ENC MOIS LTX SZ6.5 (GLOVE) ×2 IMPLANT
GLOVE SURG ENC MOIS LTX SZ8 (GLOVE) ×4 IMPLANT
GLOVE SURG UNDER POLY LF SZ7 (GLOVE) ×2 IMPLANT
GLOVE SURG UNDER POLY LF SZ8 (GLOVE) ×1
GLOVE SURG UNDER POLY LF SZ8.5 (GLOVE) ×2 IMPLANT
GOWN STRL REUS W/TWL LRG LVL3 (GOWN DISPOSABLE) ×4 IMPLANT
GOWN STRL REUS W/TWL XL LVL3 (GOWN DISPOSABLE) ×2 IMPLANT
HANDPIECE INTERPULSE COAX TIP (DISPOSABLE) ×1
HOLDER FOLEY CATH W/STRAP (MISCELLANEOUS) IMPLANT
IMMOBILIZER KNEE 20 (SOFTGOODS) ×2
IMMOBILIZER KNEE 20 THIGH 36 (SOFTGOODS) ×1 IMPLANT
KIT TURNOVER KIT A (KITS) ×2 IMPLANT
MANIFOLD NEPTUNE II (INSTRUMENTS) ×2 IMPLANT
NS IRRIG 1000ML POUR BTL (IV SOLUTION) ×2 IMPLANT
PACK TOTAL KNEE CUSTOM (KITS) ×2 IMPLANT
PADDING CAST ABS 6INX4YD NS (CAST SUPPLIES) ×1
PADDING CAST ABS COTTON 6X4 NS (CAST SUPPLIES) ×1 IMPLANT
PADDING CAST COTTON 6X4 STRL (CAST SUPPLIES) ×4 IMPLANT
PATELLA DOME PFC 38MM (Knees) ×2 IMPLANT
PENCIL SMOKE EVACUATOR (MISCELLANEOUS) ×2 IMPLANT
PIN STEINMAN FIXATION KNEE (PIN) ×2 IMPLANT
PLATE ROT INSERT 12.5MM SIZE 3 (Plate) ×2 IMPLANT
PROTECTOR NERVE ULNAR (MISCELLANEOUS) ×2 IMPLANT
SET HNDPC FAN SPRY TIP SCT (DISPOSABLE) ×1 IMPLANT
STRIP CLOSURE SKIN 1/2X4 (GAUZE/BANDAGES/DRESSINGS) ×4 IMPLANT
SUT MNCRL AB 4-0 PS2 18 (SUTURE) ×2 IMPLANT
SUT STRATAFIX 0 PDS 27 VIOLET (SUTURE) ×2
SUT VIC AB 2-0 CT1 27 (SUTURE) ×3
SUT VIC AB 2-0 CT1 TAPERPNT 27 (SUTURE) ×3 IMPLANT
SUTURE STRATFX 0 PDS 27 VIOLET (SUTURE) ×1 IMPLANT
TIBIA MBT CEMENT SIZE 2.5 (Knees) ×2 IMPLANT
TRAY FOLEY MTR SLVR 16FR STAT (SET/KITS/TRAYS/PACK) ×2 IMPLANT
TUBE SUCTION HIGH CAP CLEAR NV (SUCTIONS) ×2 IMPLANT
WATER STERILE IRR 1000ML POUR (IV SOLUTION) ×4 IMPLANT
WRAP KNEE MAXI GEL POST OP (GAUZE/BANDAGES/DRESSINGS) ×2 IMPLANT

## 2021-07-19 NOTE — Discharge Instructions (Signed)
 Frank Aluisio, MD Total Joint Specialist [facility] [address] [place], [place] [address] [phone]  TOTAL KNEE REPLACEMENT POSTOPERATIVE DIRECTIONS    Knee Rehabilitation, Guidelines Following Surgery  Results after knee surgery are often greatly improved when you follow the exercise, range of motion and muscle strengthening exercises prescribed by your doctor. Safety measures are also important to protect the knee from further injury. If any of these exercises cause you to have increased pain or swelling in your knee joint, decrease the amount until you are comfortable again and slowly increase them. If you have problems or questions, call your caregiver or physical therapist for advice.   HOME CARE INSTRUCTIONS  Remove items at home which could result in a fall. This includes throw rugs or furniture in walking pathways.  ICE to the affected knee as much as tolerated. Icing helps control swelling. If the swelling is well controlled you will be more comfortable and rehab easier. Continue to use ice on the knee for pain and swelling from surgery. You may notice swelling that will progress down to the foot and ankle. This is normal after surgery. Elevate the leg when you are not up walking on it.    Continue to use the breathing machine which will help keep your temperature down. It is common for your temperature to cycle up and down following surgery, especially at night when you are not up moving around and exerting yourself. The breathing machine keeps your lungs expanded and your temperature down. Do not place pillow under the operative knee, focus on keeping the knee straight while resting  DIET You may resume your previous home diet once you are discharged from the hospital.  DRESSING / WOUND CARE / SHOWERING Keep your bulky bandage on for 2 days. On the third post-operative day you may remove the Ace bandage and gauze. There is a waterproof  adhesive bandage on your skin which will stay in place until your first follow-up appointment. Once you remove this you will not need to place another bandage You may begin showering 3 days following surgery, but do not submerge the incision under water.  ACTIVITY For the first 5 days, the key is rest and control of pain and swelling Do your home exercises twice a day starting on post-operative day 3. On the days you go to physical therapy, just do the home exercises once that day. You should rest, ice and elevate the leg for 50 minutes out of every hour. Get up and walk/stretch for 10 minutes per hour. After 5 days you can increase your activity slowly as tolerated. Walk with your walker as instructed. Use the walker until you are comfortable transitioning to a cane. Walk with the cane in the opposite hand of the operative leg. You may discontinue the cane once you are comfortable and walking steadily. Avoid periods of inactivity such as sitting longer than an hour when not asleep. This helps prevent blood clots.  You may discontinue the knee immobilizer once you are able to perform a straight leg raise while lying down. You may resume a sexual relationship in one month or when given the OK by your doctor.  You may return to work once you are cleared by your doctor.  Do not drive a car for 6 weeks or until released by your surgeon.  Do not drive while taking narcotics.  TED HOSE STOCKINGS Wear the elastic stockings on both legs for three weeks following surgery during the   day. You may remove them at night for sleeping.  WEIGHT BEARING Weight bearing as tolerated with assist device (walker, cane, etc) as directed, use it as long as suggested by your surgeon or therapist, typically at least 4-6 weeks.  POSTOPERATIVE CONSTIPATION PROTOCOL Constipation - defined medically as fewer than three stools per week and severe constipation as less than one stool per week.  One of the most common issues  patients have following surgery is constipation.  Even if you have a regular bowel pattern at home, your normal regimen is likely to be disrupted due to multiple reasons following surgery.  Combination of anesthesia, postoperative narcotics, change in appetite and fluid intake all can affect your bowels.  In order to avoid complications following surgery, here are some recommendations in order to help you during your recovery period.  Colace (docusate) - Pick up an over-the-counter form of Colace or another stool softener and take twice a day as long as you are requiring postoperative pain medications.  Take with a full glass of water daily.  If you experience loose stools or diarrhea, hold the colace until you stool forms back up. If your symptoms do not get better within 1 week or if they get worse, check with your doctor. Dulcolax (bisacodyl) - Pick up over-the-counter and take as directed by the product packaging as needed to assist with the movement of your bowels.  Take with a full glass of water.  Use this product as needed if not relieved by Colace only.  MiraLax (polyethylene glycol) - Pick up over-the-counter to have on hand. MiraLax is a solution that will increase the amount of water in your bowels to assist with bowel movements.  Take as directed and can mix with a glass of water, juice, soda, coffee, or tea. Take if you go more than two days without a movement. Do not use MiraLax more than once per day. Call your doctor if you are still constipated or irregular after using this medication for 7 days in a row.  If you continue to have problems with postoperative constipation, please contact the office for further assistance and recommendations.  If you experience "the worst abdominal pain ever" or develop nausea or vomiting, please contact the office immediatly for further recommendations for treatment.  ITCHING If you experience itching with your medications, try taking only a single pain  pill, or even half a pain pill at a time.  You can also use Benadryl over the counter for itching or also to help with sleep.   MEDICATIONS See your medication summary on the "After Visit Summary" that the nursing staff will review with you prior to discharge.  You may have some home medications which will be placed on hold until you complete the course of blood thinner medication.  It is important for you to complete the blood thinner medication as prescribed by your surgeon.  Continue your approved medications as instructed at time of discharge.  PRECAUTIONS If you experience chest pain or shortness of breath - call 911 immediately for transfer to the hospital emergency department.  If you develop a fever greater that 101 F, purulent drainage from wound, increased redness or drainage from wound, foul odor from the wound/dressing, or calf pain - CONTACT YOUR SURGEON.                                                     FOLLOW-UP APPOINTMENTS Make sure you keep all of your appointments after your operation with your surgeon and caregivers. You should call the office at the above phone number and make an appointment for approximately two weeks after the date of your surgery or on the date instructed by your surgeon outlined in the "After Visit Summary".  RANGE OF MOTION AND STRENGTHENING EXERCISES  Rehabilitation of the knee is important following a knee injury or an operation. After just a few days of immobilization, the muscles of the thigh which control the knee become weakened and shrink (atrophy). Knee exercises are designed to build up the tone and strength of the thigh muscles and to improve knee motion. Often times heat used for twenty to thirty minutes before working out will loosen up your tissues and help with improving the range of motion but do not use heat for the first two weeks following surgery. These exercises can be done on a training (exercise) mat, on the floor, on a table or on a bed.  Use what ever works the best and is most comfortable for you Knee exercises include:  Leg Lifts - While your knee is still immobilized in a splint or cast, you can do straight leg raises. Lift the leg to 60 degrees, hold for 3 sec, and slowly lower the leg. Repeat 10-20 times 2-3 times daily. Perform this exercise against resistance later as your knee gets better.  Quad and Hamstring Sets - Tighten up the muscle on the front of the thigh (Quad) and hold for 5-10 sec. Repeat this 10-20 times hourly. Hamstring sets are done by pushing the foot backward against an object and holding for 5-10 sec. Repeat as with quad sets.  Leg Slides: Lying on your back, slowly slide your foot toward your buttocks, bending your knee up off the floor (only go as far as is comfortable). Then slowly slide your foot back down until your leg is flat on the floor again. Angel Wings: Lying on your back spread your legs to the side as far apart as you can without causing discomfort.  A rehabilitation program following serious knee injuries can speed recovery and prevent re-injury in the future due to weakened muscles. Contact your doctor or a physical therapist for more information on knee rehabilitation.   POST-OPERATIVE OPIOID TAPER INSTRUCTIONS: It is important to wean off of your opioid medication as soon as possible. If you do not need pain medication after your surgery it is ok to stop day one. Opioids include: Codeine, Hydrocodone(Norco, Vicodin), Oxycodone(Percocet, oxycontin) and hydromorphone amongst others.  Long term and even short term use of opiods can cause: Increased pain response Dependence Constipation Depression Respiratory depression And more.  Withdrawal symptoms can include Flu like symptoms Nausea, vomiting And more Techniques to manage these symptoms Hydrate well Eat regular healthy meals Stay active Use relaxation techniques(deep breathing, meditating, yoga) Do Not substitute Alcohol to help  with tapering If you have been on opioids for less than two weeks and do not have pain than it is ok to stop all together.  Plan to wean off of opioids This plan should start within one week post op of your joint replacement. Maintain the same interval or time between taking each dose and first decrease the dose.  Cut the total daily intake of opioids by one tablet each day Next start to increase the time between doses. The last dose that should be eliminated is the evening dose.   IF YOU ARE TRANSFERRED TO   A SKILLED REHAB FACILITY If the patient is transferred to a skilled rehab facility following release from the hospital, a list of the current medications will be sent to the facility for the patient to continue.  When discharged from the skilled rehab facility, please have the facility set up the patient's Home Health Physical Therapy prior to being released. Also, the skilled facility will be responsible for providing the patient with their medications at time of release from the facility to include their pain medication, the muscle relaxants, and their blood thinner medication. If the patient is still at the rehab facility at time of the two week follow up appointment, the skilled rehab facility will also need to assist the patient in arranging follow up appointment in our office and any transportation needs.  MAKE SURE YOU:  Understand these instructions.  Get help right away if you are not doing well or get worse.   DENTAL ANTIBIOTICS:  In most cases prophylactic antibiotics for Dental procdeures after total joint surgery are not necessary.  Exceptions are as follows:  1. History of prior total joint infection  2. Severely immunocompromised (Organ Transplant, cancer chemotherapy, Rheumatoid biologic meds such as Humera)  3. Poorly controlled diabetes (A1C &gt; 8.0, blood glucose over 200)  If you have one of these conditions, contact your surgeon for an antibiotic prescription,  prior to your dental procedure.    Pick up stool softner and laxative for home use following surgery while on pain medications. Do not submerge incision under water. Please use good hand washing techniques while changing dressing each day. May shower starting three days after surgery. Please use a clean towel to pat the incision dry following showers. Continue to use ice for pain and swelling after surgery. Do not use any lotions or creams on the incision until instructed by your surgeon.  

## 2021-07-19 NOTE — Interval H&P Note (Signed)
History and Physical Interval Note:  07/19/2021 9:41 AM  Frances Smith  has presented today for surgery, with the diagnosis of right knee osteoarthritis.  The various methods of treatment have been discussed with the patient and family. After consideration of risks, benefits and other options for treatment, the patient has consented to  Procedure(s): TOTAL KNEE ARTHROPLASTY (Right) as a surgical intervention.  The patient's history has been reviewed, patient examined, no change in status, stable for surgery.  I have reviewed the patient's chart and labs.  Questions were answered to the patient's satisfaction.     Pilar Plate Kalab Camps

## 2021-07-19 NOTE — Anesthesia Procedure Notes (Addendum)
  Anesthesia Regional Block: Adductor canal block   Pre-Anesthetic Checklist: , timeout performed,  Correct Patient, Correct Site, Correct Laterality,  Correct Procedure, Correct Position, site marked,  Risks and benefits discussed,  Surgical consent,  Pre-op evaluation,  At surgeon's request and post-op pain management  Laterality: Right  Prep: chloraprep       Needles:  Injection technique: Single-shot  Needle Type: Echogenic Stimulator Needle     Needle Length: 10cm  Needle Gauge: 21   Needle insertion depth: 7 cm   Additional Needles:   Procedures:,,,, ultrasound used (permanent image in chart),,    Narrative:  Start time: 07/19/2021 11:04 AM End time: 07/19/2021 11:09 AM Injection made incrementally with aspirations every 5 mL.  Performed by: Personally  Anesthesiologist: Josephine Igo, MD  Additional Notes: Timeout performed. Patient sedated. Relevant anatomy ID'd using [place]. Incremental 2-56m injection of LA with frequent aspiration. Patient tolerated procedure well.

## 2021-07-19 NOTE — Anesthesia Postprocedure Evaluation (Signed)
Anesthesia Post Note  Patient: Frances Smith  Procedure(s) Performed: TOTAL KNEE ARTHROPLASTY (Right: Knee)     Patient location during evaluation: PACU Anesthesia Type: Spinal Level of consciousness: oriented and awake and alert Pain management: pain level controlled Vital Signs Assessment: post-procedure vital signs reviewed and stable Respiratory status: spontaneous breathing, respiratory function stable and nonlabored ventilation Cardiovascular status: blood pressure returned to baseline and stable Postop Assessment: no headache, no backache, no apparent nausea or vomiting, spinal receding and patient able to bend at knees Anesthetic complications: no   No notable events documented.  Last Vitals:  Vitals:   07/19/21 1345 07/19/21 1400  BP: 130/73 136/75  Pulse: 63 63  Resp: 10 15  Temp:    SpO2: 98% 95%    Last Pain:  Vitals:   07/19/21 1400  TempSrc:   PainSc: 0-No pain                 [name] A.

## 2021-07-19 NOTE — Anesthesia Procedure Notes (Signed)
Spinal  Patient location during procedure: OR Start time: 07/19/2021 11:37 AM End time: 07/19/2021 11:40 AM Reason for block: surgical anesthesia Staffing Performed: anesthesiologist  Anesthesiologist: Josephine Igo, MD Preanesthetic Checklist Completed: patient identified, IV checked, site marked, risks and benefits discussed, surgical consent, monitors and equipment checked, pre-op evaluation and timeout performed Spinal Block Patient position: sitting Prep: DuraPrep and site prepped and draped Patient monitoring: heart rate, cardiac monitor, continuous pulse ox and blood pressure Approach: midline Location: L3-4 Injection technique: single-shot Needle Needle type: Pencan  Needle gauge: 24 G Needle length: 9 cm Needle insertion depth: 6 cm Assessment Sensory level: T4 Events: CSF return Additional Notes Patient tolerated procedure well. Adequate sensory level.

## 2021-07-19 NOTE — Transfer of Care (Signed)
Immediate Anesthesia Transfer of Care Note  Patient: AMIYRAH LAMERE  Procedure(s) Performed: TOTAL KNEE ARTHROPLASTY (Right: Knee)  Patient Location: PACU  Anesthesia Type:Spinal and MAC combined with regional for post-op pain  Level of Consciousness: awake, alert , oriented and patient cooperative  Airway & Oxygen Therapy: Patient Spontanous Breathing and Patient connected to face mask oxygen  Post-op Assessment: Report given to RN and Post -op Vital signs reviewed and stable  Post vital signs: Reviewed and stable  Last Vitals:  Vitals Value Taken Time  BP 105/64 07/19/21 1315  Temp    Pulse 72 07/19/21 1316  Resp 16 07/19/21 1316  SpO2 100 % 07/19/21 1316  Vitals shown include unvalidated device data.  Last Pain:  Vitals:   07/19/21 0917  TempSrc:   PainSc: 0-No pain      Patients Stated Pain Goal: 4 (11/13/48 7530)  Complications: No notable events documented.

## 2021-07-19 NOTE — Op Note (Signed)
OPERATIVE REPORT-TOTAL KNEE ARTHROPLASTY   Pre-operative diagnosis- Osteoarthritis  Right knee(s)  Post-operative diagnosis- Osteoarthritis Right knee(s)  Procedure-  Right  Total Knee Arthroplasty  Surgeon- Dione Plover. Makaelyn Aponte, MD  Assistant- Molli Barrows, PA-C   Anesthesia-   Adductor canal block and spinal  EBL-25 mL   Drains None  Tourniquet time-  Total Tourniquet Time Documented: Thigh (Right) - 39 minutes Total: Thigh (Right) - 39 minutes     Complications- None  Condition-PACU - hemodynamically stable.   Brief Clinical Note  Frances Smith is a 75 y.o. year old female with end stage OA of her right knee with progressively worsening pain and dysfunction. She has constant pain, with activity and at rest and significant functional deficits with difficulties even with ADLs. She has had extensive non-op management including analgesics, injections of cortisone and viscosupplements, and home exercise program, but remains in significant pain with significant dysfunction.Radiographs show bone on bone arthritis medial and patellofemoral. She presents now for right Total Knee Arthroplasty.     Procedure in detail---   The patient is brought into the operating room and positioned supine on the operating table. After successful administration of  Adductor canal block and spinal,   a tourniquet is placed high on the  Right thigh(s) and the lower extremity is prepped and draped in the usual sterile fashion. Time out is performed by the operating team and then the  Right lower extremity is wrapped in Esmarch, knee flexed and the tourniquet inflated to 300 mmHg.       A midline incision is made with a ten blade through the subcutaneous tissue to the level of the extensor mechanism. A fresh blade is used to make a medial parapatellar arthrotomy. Soft tissue over the proximal medial tibia is subperiosteally elevated to the joint line with a knife and into the semimembranosus bursa with a Cobb  elevator. Soft tissue over the proximal lateral tibia is elevated with attention being paid to avoiding the patellar tendon on the tibial tubercle. The patella is everted, knee flexed 90 degrees and the ACL and PCL are removed. Findings are bone on bone medial and patellofemoral with large global osteophytes.        The drill is used to create a starting hole in the distal femur and the canal is thoroughly irrigated with sterile saline to remove the fatty contents. The 5 degree Right  valgus alignment guide is placed into the femoral canal and the distal femoral cutting block is pinned to remove 10 mm off the distal femur. Resection is made with an oscillating saw.      The tibia is subluxed forward and the menisci are removed. The extramedullary alignment guide is placed referencing proximally at the medial aspect of the tibial tubercle and distally along the second metatarsal axis and tibial crest. The block is pinned to remove 67m off the more deficient medial  side. Resection is made with an oscillating saw. Size 2.5is the most appropriate size for the tibia and the proximal tibia is prepared with the modular drill and keel punch for that size.      The femoral sizing guide is placed and size 3 is most appropriate. Rotation is marked off the epicondylar axis and confirmed by creating a rectangular flexion gap at 90 degrees. The size 3 cutting block is pinned in this rotation and the anterior, posterior and chamfer cuts are made with the oscillating saw. The intercondylar block is then placed and that cut is made.  Trial size 2.5 tibial component, trial size 3 posterior stabilized femur and a 12.5  mm posterior stabilized rotating platform insert trial is placed. Full extension is achieved with excellent varus/valgus and anterior/posterior balance throughout full range of motion. The patella is everted and thickness measured to be 23  mm. Free hand resection is taken to 13 mm, a 38 template is placed, lug  holes are drilled, trial patella is placed, and it tracks normally. Osteophytes are removed off the posterior femur with the trial in place. All trials are removed and the cut bone surfaces prepared with pulsatile lavage. Cement is mixed and once ready for implantation, the size 2.5 tibial implant, size  3 posterior stabilized femoral component, and the size 38 patella are cemented in place and the patella is held with the clamp. The trial insert is placed and the knee held in full extension. The Exparel (20 ml mixed with 60 ml saline) is injected into the extensor mechanism, posterior capsule, medial and lateral gutters and subcutaneous tissues.  All extruded cement is removed and once the cement is hard the permanent 12.5 mm posterior stabilized rotating platform insert is placed into the tibial tray.      The wound is copiously irrigated with saline solution and the extensor mechanism closed with # 0 Stratofix suture. The tourniquet is released for a total tourniquet time of 39  minutes. Flexion against gravity is 140 degrees and the patella tracks normally. Subcutaneous tissue is closed with 2.0 vicryl and subcuticular with running 4.0 Monocryl. The incision is cleaned and dried and steri-strips and a bulky sterile dressing are applied. The limb is placed into a knee immobilizer and the patient is awakened and transported to recovery in stable condition.      Please note that a surgical assistant was a medical necessity for this procedure in order to perform it in a safe and expeditious manner. Surgical assistant was necessary to retract the ligaments and vital neurovascular structures to prevent injury to them and also necessary for proper positioning of the limb to allow for anatomic placement of the prosthesis.   Dione Plover Eivin Mascio, MD    07/19/2021, 12:52 PM

## 2021-07-19 NOTE — Progress Notes (Signed)
AssistedDr. Foster with right, ultrasound guided, adductor canal block. Side rails up, monitors on throughout procedure. See vital signs in flow sheet. Tolerated Procedure well.  

## 2021-07-19 NOTE — Progress Notes (Signed)
Orthopedic Tech Progress Note Patient Details:  Frances Smith 1946/04/18 [id number]  CPM Right Knee CPM Right Knee: On Right Knee Flexion (Degrees): 40 Right Knee Extension (Degrees): 10  Post Interventions Patient Tolerated: Well Instructions Provided: Care of device  [place] Pink 07/19/2021, 1:35 PM

## 2021-07-19 NOTE — Anesthesia Procedure Notes (Signed)
Procedure Name: MAC Date/Time: 07/19/2021 11:42 AM Performed by: Lollie Sails, CRNA Pre-anesthesia Checklist: Patient identified, Emergency Drugs available, Suction available, Patient being monitored and Timeout performed Oxygen Delivery Method: Simple face mask Placement Confirmation: positive ETCO2

## 2021-07-19 NOTE — Evaluation (Signed)
Physical Therapy Evaluation Patient Details Name: Frances Smith MRN: [id number] DOB: 05-25-1946 Today's Date: 07/19/2021   History of Present Illness  Patient is 75 y.o. female s/p Rt TKA on 07/19/21 with PMH significant for OA, anxiety, Afib, depression GER, HTN, HLD, hypothyroidism, Lt UKA in 2017.   Clinical Impression  Frances Smith is a 75 y.o. female POD 0 s/p Rt TKA. Patient reports independence with mobility at baseline. Patient is now limited by functional impairments (see PT problem list below) and requires min assist for transfers and gait with RW. Patient was able to ambulate ~45 feet with RW and min assist. Patient instructed in exercise to facilitate ROM and circulation to manage edema and reduce risk of DVT. Patient will benefit from continued skilled PT interventions to address impairments and progress towards PLOF. Acute PT will follow to progress mobility and stair training in preparation for safe discharge home.     Follow Up Recommendations Follow surgeon's recommendation for DC plan and follow-up therapies    Equipment Recommendations  3in1 (PT)    Recommendations for Other Services       Precautions / Restrictions Precautions Precautions: Fall Restrictions Weight Bearing Restrictions: No RLE Weight Bearing: Weight bearing as tolerated      Mobility  Bed Mobility Overal bed mobility: Needs Assistance Bed Mobility: Supine to Sit     Supine to sit: Min assist;HOB elevated     General bed mobility comments: cues to use bed rail, assist to bring LE's off EOB and raise trunk. pt able to use UE's to scoot to EOB.    Transfers Overall transfer level: Needs assistance Equipment used: Rolling walker (2 wheeled) Transfers: Sit to/from Stand Sit to Stand: Min assist         General transfer comment: cues for hand placement and technique with RW. Assist for power up and to steady with RW.  Ambulation/Gait Ambulation/Gait assistance: Min assist Gait  Distance (Feet): 45 Feet Assistive device: Rolling walker (2 wheeled) Gait Pattern/deviations: Step-to pattern;Decreased stride length;Decreased weight shift to right Gait velocity: decr   General Gait Details: cues for step to pattern and safe proximity to RW. no overt LOB or buckling at Rt knee, assist to [facility] walker with turns.  Stairs            Wheelchair Mobility    Modified Rankin (Stroke Patients Only)       Balance                                             Pertinent Vitals/Pain Pain Assessment: 0-10 Pain Location: Rt knee Pain Descriptors / Indicators: Aching Pain Intervention(s): Limited activity within patient's tolerance;Monitored during session;Repositioned;Ice applied    Home Living Family/patient expects to be discharged to:: Private residence Living Arrangements: Spouse/significant other Available Help at Discharge: Family Type of Home: House Home Access: Stairs to enter Entrance Stairs-Rails: Can reach both Entrance Stairs-Number of Steps: 2+1 Home Layout: One level Home Equipment: Environmental consultant - 2 wheels;Walker - 4 wheels;Cane - single point;Grab bars - toilet;Grab bars - tub/shower;Shower seat      Prior Function Level of Independence: Independent with assistive device(s)               Hand Dominance   Dominant Hand: Right    Extremity/Trunk Assessment   Upper Extremity Assessment Upper Extremity Assessment: Overall WFL for tasks assessed  Lower Extremity Assessment Lower Extremity Assessment: RLE deficits/detail RLE Deficits / Details: good quad acitvation, no extensor lag RLE Sensation: WNL RLE Coordination: WNL    Cervical / Trunk Assessment Cervical / Trunk Assessment: Normal  Communication   Communication: No difficulties  Cognition Arousal/Alertness: Awake/alert Behavior During Therapy: WFL for tasks assessed/performed Overall Cognitive Status: Within Functional Limits for tasks assessed                                         General Comments      Exercises Total Joint Exercises Ankle Circles/Pumps: AROM;Both;Seated;20 reps Quad Sets: AROM;Right;10 reps;Seated Heel Slides: AROM;Right;10 reps;Seated   Assessment/Plan    PT Assessment Patient needs continued PT services  PT Problem List Decreased strength;Decreased range of motion;Decreased activity tolerance;Decreased balance;Decreased mobility;Decreased knowledge of use of DME;Pain;Decreased safety awareness;Decreased knowledge of precautions       PT Treatment Interventions Therapeutic exercise;DME instruction;Gait training;Stair training;Functional mobility training;Therapeutic activities;Balance training;Patient/family education    PT Goals (Current goals can be found in the Care Plan section)  Acute Rehab PT Goals Patient Stated Goal: get back to moving independently PT Goal Formulation: With patient Time For Goal Achievement: 07/26/21 Potential to Achieve Goals: Good    Frequency 7X/week   Barriers to discharge        Co-evaluation               AM-PAC PT "6 Clicks" Mobility  Outcome Measure Help needed turning from your back to your side while in a flat bed without using bedrails?: A Little Help needed moving from lying on your back to sitting on the side of a flat bed without using bedrails?: A Little Help needed moving to and from a bed to a chair (including a wheelchair)?: A Little Help needed standing up from a chair using your arms (e.g., wheelchair or bedside chair)?: A Little Help needed to walk in hospital room?: A Little Help needed climbing 3-5 steps with a railing? : A Little 6 Click Score: 18    End of Session Equipment Utilized During Treatment: Gait belt Activity Tolerance: Patient tolerated treatment well Patient left: in chair;with call bell/phone within reach;with chair alarm set;with family/visitor present Nurse Communication: Mobility status PT Visit Diagnosis:  Other abnormalities of gait and mobility (R26.89);Muscle weakness (generalized) (M62.81);Difficulty in walking, not elsewhere classified (R26.2)    Time: [id number] PT Time Calculation (min) (ACUTE ONLY): 25 min   Charges:   PT Evaluation $PT Eval Low Complexity: 1 Low PT Treatments $Gait Training: 8-22 mins        Verner Mould, DPT Acute Rehabilitation Services Office [phone] Pager [phone]   Jacques Navy 07/19/2021, 7:04 PM

## 2021-07-20 DIAGNOSIS — M1711 Unilateral primary osteoarthritis, right knee: Secondary | ICD-10-CM | POA: Diagnosis not present

## 2021-07-20 LAB — CBC
HCT: 36.5 % (ref 36.0–46.0)
Hemoglobin: 11.9 g/dL — ABNORMAL LOW (ref 12.0–15.0)
MCH: 30.1 pg (ref 26.0–34.0)
MCHC: 32.6 g/dL (ref 30.0–36.0)
MCV: 92.2 fL (ref 80.0–100.0)
Platelets: 178 10*3/uL (ref 150–400)
RBC: 3.96 MIL/uL (ref 3.87–5.11)
RDW: 13.5 % (ref 11.5–15.5)
WBC: 8.1 10*3/uL (ref 4.0–10.5)
nRBC: 0 % (ref 0.0–0.2)

## 2021-07-20 LAB — BASIC METABOLIC PANEL
Anion gap: 7 (ref 5–15)
BUN: 12 mg/dL (ref 8–23)
CO2: 24 mmol/L (ref 22–32)
Calcium: 9 mg/dL (ref 8.9–10.3)
Chloride: 109 mmol/L (ref 98–111)
Creatinine, Ser: 0.77 mg/dL (ref 0.44–1.00)
GFR, Estimated: >60 mL/min (ref >60)
Glucose, Bld: 132 mg/dL — ABNORMAL HIGH (ref 70–99)
Potassium: 4.1 mmol/L (ref 3.5–5.1)
Sodium: 140 mmol/L (ref 135–145)

## 2021-07-20 MED ORDER — OXYCODONE HCL 5 MG PO TABS: 5.0000 mg | ORAL_TABLET | Freq: Four times a day (QID) | ORAL | 0 refills | Status: DC | PRN

## 2021-07-20 MED ORDER — METHOCARBAMOL 500 MG PO TABS: 500.0000 mg | ORAL_TABLET | Freq: Four times a day (QID) | ORAL | 0 refills | Status: DC | PRN

## 2021-07-20 NOTE — Progress Notes (Signed)
Physical Therapy Treatment Patient Details Name: Frances Smith MRN: [id number] DOB: 11-Mar-1946 Today's Date: 07/20/2021    History of Present Illness Patient is 75 y.o. female s/p Rt TKA on 07/19/21 with PMH significant for OA, anxiety, Afib, depression GER, HTN, HLD, hypothyroidism, Lt UKA in 2017.    PT Comments    Patient is progressing well with acute PT and was able to ambulate ~130' with RW and min assist/guard. Pt completed stair mobility to simulate home with 2 steps and bil rails. No overt LOB noted and pt verbalized safe guarding/assist for family to provide. Educated on HEP for supine exercises. Will follow up for PM session to complete family training in preparation for safe discharge home.    Follow Up Recommendations  Follow surgeon's recommendation for DC plan and follow-up therapies     Equipment Recommendations  3in1 (PT)    Recommendations for Other Services       Precautions / Restrictions Precautions Precautions: Fall Restrictions Weight Bearing Restrictions: No RLE Weight Bearing: Weight bearing as tolerated    Mobility  Bed Mobility Overal bed mobility: Needs Assistance Bed Mobility: Supine to Sit     Supine to sit: HOB elevated;Min guard     General bed mobility comments: cues to use bed rail, assist to bring LE's off EOB and raise trunk. pt able to use UE's to scoot to EOB.    Transfers Overall transfer level: Needs assistance Equipment used: Rolling walker (2 wheeled) Transfers: Sit to/from Stand Sit to Stand: Min assist         General transfer comment: cues for hand placement and technique with RW. Assist for power up.  Ambulation/Gait Ambulation/Gait assistance: Min assist Gait Distance (Feet): 130 Feet Assistive device: Rolling walker (2 wheeled) Gait Pattern/deviations: Step-to pattern;Decreased stride length;Decreased weight shift to right Gait velocity: decr   General Gait Details: cues for step to pattern and safe proximity  to RW. no overt LOB or buckling at Rt knee.   Stairs Stairs: Yes Stairs assistance: Min guard;Min assist Stair Management: Two rails;Step to pattern;Forwards Number of Stairs: 2 General stair comments: cues for sequencing "up with good, down with bad" and pt veralized safe guaridng for family to provide.   Wheelchair Mobility    Modified Rankin (Stroke Patients Only)       Balance Overall balance assessment: Needs assistance Sitting-balance support: Feet supported Sitting balance-Leahy Scale: Good     Standing balance support: During functional activity;Bilateral upper extremity supported Standing balance-Leahy Scale: Fair                              Cognition Arousal/Alertness: Awake/alert Behavior During Therapy: WFL for tasks assessed/performed Overall Cognitive Status: Within Functional Limits for tasks assessed                                        Exercises Total Joint Exercises Ankle Circles/Pumps: AROM;Both;Seated;15 reps Quad Sets: AROM;Right;Seated;5 reps Short Arc Quad: AROM;Right;Seated;5 reps Heel Slides: AROM;Right;Seated;5 reps Hip ABduction/ADduction: AROM;Right;Seated;5 reps Straight Leg Raises: AROM;Right;Seated;5 reps    General Comments        Pertinent Vitals/Pain Pain Assessment: 0-10 Pain Score: 5  Pain Location: Rt knee Pain Descriptors / Indicators: Aching Pain Intervention(s): Limited activity within patient's tolerance;Monitored during session;Premedicated before session;Repositioned;Ice applied    Home Living  Prior Function            PT Goals (current goals can now be found in the care plan section) Acute Rehab PT Goals Patient Stated Goal: get back to moving independently PT Goal Formulation: With patient Time For Goal Achievement: 07/26/21 Potential to Achieve Goals: Good Progress towards PT goals: Progressing toward goals    Frequency    7X/week       PT Plan Current plan remains appropriate    Co-evaluation              AM-PAC PT "6 Clicks" Mobility   Outcome Measure  Help needed turning from your back to your side while in a flat bed without using bedrails?: A Little Help needed moving from lying on your back to sitting on the side of a flat bed without using bedrails?: A Little Help needed moving to and from a bed to a chair (including a wheelchair)?: A Little Help needed standing up from a chair using your arms (e.g., wheelchair or bedside chair)?: A Little Help needed to walk in hospital room?: A Little Help needed climbing 3-5 steps with a railing? : A Little 6 Click Score: 18    End of Session Equipment Utilized During Treatment: Gait belt Activity Tolerance: Patient tolerated treatment well Patient left: in chair;with call bell/phone within reach;with chair alarm set;with family/visitor present Nurse Communication: Mobility status PT Visit Diagnosis: Other abnormalities of gait and mobility (R26.89);Muscle weakness (generalized) (M62.81);Difficulty in walking, not elsewhere classified (R26.2)     Time: [id number] PT Time Calculation (min) (ACUTE ONLY): 27 min  Charges:  $Gait Training: 8-22 mins $Therapeutic Exercise: 8-22 mins                     Verner Mould, DPT Acute Rehabilitation Services Office [phone] Pager [phone]    Jacques Navy 07/20/2021, 4:01 PM

## 2021-07-20 NOTE — Progress Notes (Signed)
Subjective: 1 Day Post-Op Procedure(s) (LRB): TOTAL KNEE ARTHROPLASTY (Right) Patient reports pain as mild.   Patient seen in rounds by Dr. Wynelle Link. Patient is well, and has had no acute complaints or problems other than pain in the right knee. Denies chest pain or SOB. No issues overnight. Foley catheter removed this AM. We will continue therapy today, ambulated 77' yesterday.   Objective: Vital signs in last 24 hours: Temp:  [97.7 F (36.5 C)-98.8 F (37.1 C)] 97.7 F (36.5 C) (08/30 0618) Pulse Rate:  [63-81] 66 (08/30 0618) Resp:  [10-22] 18 (08/30 0618) BP: (105-151)/(64-90) 111/65 (08/30 0618) SpO2:  [94 %-100 %] 98 % (08/30 0618) Weight:  [84.4 kg] 84.4 kg (08/29 0917)  Intake/Output from previous day:  Intake/Output Summary (Last 24 hours) at 07/20/2021 0746 Last data filed at 07/20/2021 0700 Gross per 24 hour  Intake 3367.83 ml  Output 3175 ml  Net 192.83 ml     Intake/Output this shift: No intake/output data recorded.  Labs: Recent Labs    07/20/21 0337  HGB 11.9*   Recent Labs    07/20/21 0337  WBC 8.1  RBC 3.96  HCT 36.5  PLT 178   Recent Labs    07/20/21 0337  NA 140  K 4.1  CL 109  CO2 24  BUN 12  CREATININE 0.77  GLUCOSE 132*  CALCIUM 9.0   No results for input(s): LABPT, INR in the last 72 hours.  Exam: General - Patient is Alert and Oriented Extremity - Neurologically intact Neurovascular intact Sensation intact distally Dorsiflexion/Plantar flexion intact Dressing - dressing C/D/I Motor Function - intact, moving foot and toes well on exam.   Past Medical History:  Diagnosis Date   Anal fissure    Anxiety    Arthritis    Atrial fibrillation (HCC)    Colon polyps    Depression    Diverticulosis    Dysrhythmia    GERD (gastroesophageal reflux disease)    H/O: hysterectomy    Heart murmur    as a child   History of bronchitis as a child    History of head injury    History of measles    History of mumps     Hyperlipidemia    Hypothyroidism    IBS (irritable bowel syndrome)    Menopause    OSA on CPAP    Osteopenia    Paroxysmal atrial fibrillation (HCC)    Restless leg syndrome    Thyroid disease    Venous insufficiency    right leg    Assessment/Plan: 1 Day Post-Op Procedure(s) (LRB): TOTAL KNEE ARTHROPLASTY (Right) Principal Problem:   Primary osteoarthritis of knee Active Problems:   Primary osteoarthritis of right knee  Estimated body mass index is 34.02 kg/m as calculated from the following:   Height as of this encounter: '5\' 2"'$  (1.575 m).   Weight as of this encounter: 84.4 kg. Advance diet Up with therapy D/C IV fluids   Patient's anticipated LOS is less than 2 midnights, meeting these requirements: - Lives within 1 hour of care - Has a competent adult at home to recover with post-op recover - NO history of  - Chronic pain requiring opiods  - Diabetes  - Coronary Artery Disease  - Heart failure  - Heart attack  - Stroke  - DVT/VTE  - Respiratory Failure/COPD  - Renal failure  - Anemia  - Advanced Liver disease  DVT Prophylaxis -  Eliquis Weight bearing as tolerated. Continue therapy.  Plan is to go Home after hospital stay. Plan for discharge later today if meeting goals. Scheduled for OPPT with EO. Follow-up in the office in 2 weeks.   The PDMP database was reviewed today prior to any opioid medications being prescribed to this patient.  Theresa Duty, PA-C Orthopedic Surgery [phone] 07/20/2021, 7:46 AM

## 2021-07-20 NOTE — Progress Notes (Signed)
Physical Therapy Treatment Patient Details Name: Frances Smith MRN: [id number] DOB: 1946/02/24 Today's Date: 07/20/2021    History of Present Illness Patient is 75 y.o. female s/p Rt TKA on 07/19/21 with PMH significant for OA, anxiety, Afib, depression GER, HTN, HLD, hypothyroidism, Lt UKA in 2017.    PT Comments    Patient limited by significant increase in pain and declining to attempt stair mobility with family for training initially. Pt's daughter and husband worked with PT at start for family training/education on safe guarding with stairs with rails and with steps for reverse step up to navigate curb. Pt then agreeable to perform curb training with step brought close to her. She required min assist to steady walker and guarding from family. Therapist monitored Rt knee to ensure no buckling. EOS applied knee immobilizer for safety with stair negotiation at home. Discussed pt's pain management needs and pt reports already discussed with RN and wishes to discharge home. Acute PT will continue to progress pt during her acute stay.    Follow Up Recommendations  Follow surgeon's recommendation for DC plan and follow-up therapies     Equipment Recommendations  3in1 (PT)    Recommendations for Other Services       Precautions / Restrictions Precautions Precautions: Fall Restrictions Weight Bearing Restrictions: No RLE Weight Bearing: Weight bearing as tolerated    Mobility  Bed Mobility Overal bed mobility: Needs Assistance             General bed mobility comments: pt sitting EOB at therapist arrival.    Transfers Overall transfer level: Needs assistance Equipment used: Rolling walker (2 wheeled) Transfers: Sit to/from Stand Sit to Stand: Min assist         General transfer comment: pt required min assist from spouse/therapist to rise from EOB with cues for safe hand placement to power up. pt limitd with initiating rise by incresaed pain this  session.  Ambulation/Gait Ambulation/Gait assistance: Min assist;Min guard Gait Distance (Feet): 7 Feet Assistive device: Rolling walker (2 wheeled) Gait Pattern/deviations: Step-to pattern;Decreased stride length;Decreased weight shift to right Gait velocity: decr   General Gait Details: cues for management of RW to turn and step back for single curb step up.   Stairs Stairs: Yes Stairs assistance: Min assist Stair Management: No rails;Step to pattern;Backwards;With walker Number of Stairs: 1 General stair comments: cues for sequencing reverse step up with RW. pt's husband and daughter provided safe guarding to stabilize RW and therapist blocked knee to ensure no buckling due to increased pain. pt able to complete, no buckling noted.   Wheelchair Mobility    Modified Rankin (Stroke Patients Only)       Balance Overall balance assessment: Needs assistance Sitting-balance support: Feet supported Sitting balance-Leahy Scale: Good     Standing balance support: During functional activity;Bilateral upper extremity supported Standing balance-Leahy Scale: Fair                              Cognition Arousal/Alertness: Awake/alert Behavior During Therapy: WFL for tasks assessed/performed Overall Cognitive Status: Within Functional Limits for tasks assessed                                        Exercises      General Comments        Pertinent Vitals/Pain Pain Assessment: 0-10 Pain Score: 9  Pain Location: Rt knee Pain Descriptors / Indicators: Aching Pain Intervention(s): Limited activity within patient's tolerance;Monitored during session    Home Living                      Prior Function            PT Goals (current goals can now be found in the care plan section) Acute Rehab PT Goals Patient Stated Goal: get back to moving independently PT Goal Formulation: With patient Time For Goal Achievement: 07/26/21 Potential to  Achieve Goals: Good Progress towards PT goals: Progressing toward goals    Frequency    7X/week      PT Plan Current plan remains appropriate    Co-evaluation              AM-PAC PT "6 Clicks" Mobility   Outcome Measure  Help needed turning from your back to your side while in a flat bed without using bedrails?: A Little Help needed moving from lying on your back to sitting on the side of a flat bed without using bedrails?: A Little Help needed moving to and from a bed to a chair (including a wheelchair)?: A Little Help needed standing up from a chair using your arms (e.g., wheelchair or bedside chair)?: A Little Help needed to walk in hospital room?: A Little Help needed climbing 3-5 steps with a railing? : A Little 6 Click Score: 18    End of Session Equipment Utilized During Treatment: Gait belt Activity Tolerance: Patient tolerated treatment well Patient left: in chair;with call bell/phone within reach;with chair alarm set;with family/visitor present Nurse Communication: Mobility status PT Visit Diagnosis: Other abnormalities of gait and mobility (R26.89);Muscle weakness (generalized) (M62.81);Difficulty in walking, not elsewhere classified (R26.2)     Time: [id number] PT Time Calculation (min) (ACUTE ONLY): 17 min  Charges:  $Gait Training: 8-22 mins                     Verner Mould, DPT Acute Rehabilitation Services Office [phone] Pager [phone]    Jacques Navy 07/20/2021, 5:08 PM

## 2021-07-20 NOTE — TOC Transition Note (Signed)
Transition of Care [facility]) - CM/SW Discharge Note  Patient Details  Name: Frances Smith MRN: [id number] Date of Birth: 01-22-1946  Transition of Care [facility]) CM/SW Contact:  Sherie Don, LCSW Phone Number: 07/20/2021, 10:25 AM  Clinical Narrative: Patient is expected to discharge after working with PT. CSW met with patient to review discharge plan. Patient will discharge home with OPPT through [facility]. Patient has a rolling walker and 3N1 at home, so there are no DME needs at this time. TOC signing off.  Final next level of care: OP Rehab Barriers to Discharge: No Barriers Identified  Patient Goals and CMS Choice Patient states their goals for this hospitalization and ongoing recovery are:: Discharge home with [facility] CMS Medicare.gov Compare Post Acute Care list provided to:: Patient Choice offered to / list presented to : NA  Discharge Plan and Services        DME Arranged: N/A DME Agency: NA  Readmission Risk Interventions No flowsheet data found.

## 2021-07-20 NOTE — Progress Notes (Signed)
Orthopedic Tech Progress Note Patient Details:  Frances Smith [birth year] [id number]  Patient ID: [name], female   DOB: May 06, 1946, 75 y.o.   MRN: [id number]  [place] Pink 07/20/2021, 1:02 PMCPM pickup

## 2021-07-20 NOTE — Plan of Care (Signed)
Plan of care reviewed and discussed with the patient. 

## 2021-07-22 ENCOUNTER — Encounter (HOSPITAL_COMMUNITY): Payer: Self-pay | Admitting: Orthopedic Surgery

## 2021-07-28 NOTE — Discharge Summary (Signed)
Physician Discharge Summary   Patient ID: [name] MRN: [id number] DOB/AGE: 1946-04-08 74 y.o.  Admit date: 07/19/2021 Discharge date: 07/20/2021  Primary Diagnosis: Osteoarthritis, right knee   Admission Diagnoses:  Past Medical History:  Diagnosis Date   Anal fissure    Anxiety    Arthritis    Atrial fibrillation ([place])    Colon polyps    Depression    Diverticulosis    Dysrhythmia    GERD (gastroesophageal reflux disease)    H/O: hysterectomy    Heart murmur    as a child   History of bronchitis as a child    History of head injury    History of measles    History of mumps    Hyperlipidemia    Hypothyroidism    IBS (irritable bowel syndrome)    Menopause    OSA on CPAP    Osteopenia    Paroxysmal atrial fibrillation (HCC)    Restless leg syndrome    Thyroid disease    Venous insufficiency    right leg   Discharge Diagnoses:   Principal Problem:   Primary osteoarthritis of knee Active Problems:   Primary osteoarthritis of right knee  Estimated body mass index is 34.02 kg/m as calculated from the following:   Height as of this encounter: '5\' 2"'$  (1.575 m).   Weight as of this encounter: 84.4 kg.  Procedure:  Procedure(s) (LRB): TOTAL KNEE ARTHROPLASTY (Right)   Consults: None  HPI: Frances Smith is a 75 y.o. year old female with end stage OA of her right knee with progressively worsening pain and dysfunction. She has constant pain, with activity and at rest and significant functional deficits with difficulties even with ADLs. She has had extensive non-op management including analgesics, injections of cortisone and viscosupplements, and home exercise program, but remains in significant pain with significant dysfunction.Radiographs show bone on bone arthritis medial and patellofemoral. She presents now for right Total Knee Arthroplasty.     Laboratory Data: Admission on 07/19/2021, Discharged on 07/20/2021  Component Date Value Ref Range Status   WBC  07/20/2021 8.1  4.0 - 10.5 K/uL Final   RBC 07/20/2021 3.96  3.87 - 5.11 MIL/uL Final   Hemoglobin 07/20/2021 11.9 (A) 12.0 - 15.0 g/dL Final   HCT 07/20/2021 36.5  36.0 - 46.0 % Final   MCV 07/20/2021 92.2  80.0 - 100.0 fL Final   MCH 07/20/2021 30.1  26.0 - 34.0 pg Final   MCHC 07/20/2021 32.6  30.0 - 36.0 g/dL Final   RDW 07/20/2021 13.5  11.5 - 15.5 % Final   Platelets 07/20/2021 178  150 - 400 K/uL Final   nRBC 07/20/2021 0.0  0.0 - 0.2 % Final   Performed at [facility], [place] [address], [place], [place] [address]   Sodium 07/20/2021 140  135 - 145 mmol/L Final   Potassium 07/20/2021 4.1  3.5 - 5.1 mmol/L Final   Chloride 07/20/2021 109  98 - 111 mmol/L Final   CO2 07/20/2021 24  22 - 32 mmol/L Final   Glucose, Bld 07/20/2021 132 (A) 70 - 99 mg/dL Final   Glucose reference range applies only to samples taken after fasting for at least 8 hours.   BUN 07/20/2021 12  8 - 23 mg/dL Final   Creatinine, Ser 07/20/2021 0.77  0.44 - 1.00 mg/dL Final   Calcium 07/20/2021 9.0  8.9 - 10.3 mg/dL Final   GFR, Estimated 07/20/2021 >60  >60 mL/min Final  Comment: (NOTE) Calculated using the CKD-EPI Creatinine Equation (2021)    Anion gap 07/20/2021 7  5 - 15 Final   Performed at [facility], [place] [address] [place], [place] [address]  Orders Only on 07/15/2021  Component Date Value Ref Range Status   SARS Coronavirus 2 07/15/2021 RESULT: NEGATIVE   Final   Comment: RESULT: NEGATIVESARS-CoV-2 INTERPRETATION:A NEGATIVE  test result means that SARS-CoV-2 RNA was not present in the specimen above the limit of detection of this test. This does not preclude a possible SARS-CoV-2 infection and should not be used as the  sole basis for patient management decisions. Negative results must be combined with clinical observations, patient history, and epidemiological information. Optimum specimen types and timing for peak viral levels during infections caused by  SARS-CoV-2  have not been determined. Collection of multiple specimens or types of specimens may be necessary to detect virus. Improper specimen collection and handling, sequence variability under primers/probes, or organism present below the limit of detection may  lead to false negative results. Positive and negative predictive values of testing are highly dependent on prevalence. False negative test results are more likely when prevalence of disease is high.The expected result is NEGATIVE.Fact S                          heet for  Healthcare Providers: LocalChronicle.no Sheet for Patients: SalonLookup.es Reference Range - Negative   Hospital Outpatient Visit on 07/14/2021  Component Date Value Ref Range Status   MRSA, PCR 07/14/2021 NEGATIVE  NEGATIVE Final   Staphylococcus aureus 07/14/2021 NEGATIVE  NEGATIVE Final   Comment: (NOTE) The Xpert SA Assay (FDA approved for NASAL specimens in patients 97 years of age and older), is one component of a comprehensive surveillance program. It is not intended to diagnose infection nor to guide or monitor treatment. Performed at [facility], [place] [address], [place], [place] [address]    WBC 07/14/2021 4.9  4.0 - 10.5 K/uL Final   RBC 07/14/2021 4.85  3.87 - 5.11 MIL/uL Final   Hemoglobin 07/14/2021 14.7  12.0 - 15.0 g/dL Final   HCT 07/14/2021 45.1  36.0 - 46.0 % Final   MCV 07/14/2021 93.0  80.0 - 100.0 fL Final   MCH 07/14/2021 30.3  26.0 - 34.0 pg Final   MCHC 07/14/2021 32.6  30.0 - 36.0 g/dL Final   RDW 07/14/2021 13.7  11.5 - 15.5 % Final   Platelets 07/14/2021 221  150 - 400 K/uL Final   nRBC 07/14/2021 0.0  0.0 - 0.2 % Final   Performed at [facility], [place] [address] [place], [place] [address]   Sodium 07/14/2021 145  135 - 145 mmol/L Final   Potassium 07/14/2021 4.1  3.5 - 5.1 mmol/L Final   Chloride 07/14/2021 108  98 - 111 mmol/L  Final   CO2 07/14/2021 27  22 - 32 mmol/L Final   Glucose, Bld 07/14/2021 94  70 - 99 mg/dL Final   Glucose reference range applies only to samples taken after fasting for at least 8 hours.   BUN 07/14/2021 23  8 - 23 mg/dL Final   Creatinine, Ser 07/14/2021 0.98  0.44 - 1.00 mg/dL Final   Calcium 07/14/2021 9.6  8.9 - 10.3 mg/dL Final   Total Protein 07/14/2021 7.4  6.5 - 8.1 g/dL Final   Albumin 07/14/2021 4.8  3.5 - 5.0 g/dL Final   AST 07/14/2021 20  15 -  41 U/L Final   ALT 07/14/2021 23  0 - 44 U/L Final   Alkaline Phosphatase 07/14/2021 51  38 - 126 U/L Final   Total Bilirubin 07/14/2021 0.7  0.3 - 1.2 mg/dL Final   GFR, Estimated 07/14/2021 59 (A) >60 mL/min Final   Comment: (NOTE) Calculated using the CKD-EPI Creatinine Equation (2021)    Anion gap 07/14/2021 10  5 - 15 Final   Performed at [facility], [place] [address] [place], [place] [address]   Prothrombin Time 07/14/2021 13.9  11.4 - 15.2 seconds Final   INR 07/14/2021 1.1  0.8 - 1.2 Final   Comment: (NOTE) INR goal varies based on device and disease states. Performed at [facility], [place] [address], [place], [place] [address]      X-Rays:No results found.  EKG: Orders placed or performed in visit on 06/30/21   EKG 12-Lead     Hospital Course: Frances Smith is a 75 y.o. who was admitted to [facility]. They were brought to the operating room on 07/19/2021 and underwent Procedure(s): TOTAL KNEE ARTHROPLASTY.  Patient tolerated the procedure well and was later transferred to the recovery room and then to the orthopaedic floor for postoperative care. They were given PO and IV analgesics for pain control following their surgery. They were given 24 hours of postoperative antibiotics of  Anti-infectives (From admission, onward)    Start     Dose/Rate Route Frequency Ordered Stop   07/19/21 1800  ceFAZolin (ANCEF) IVPB 2g/100 mL premix        2 g 200 mL/hr over 30 Minutes  Intravenous Every 6 hours 07/19/21 1451 07/19/21 2345   07/19/21 1545  ceFAZolin (ANCEF) IVPB 2g/100 mL premix  Status:  Discontinued        2 g 200 mL/hr over 30 Minutes Intravenous Every 6 hours 07/19/21 1447 07/19/21 1451   07/19/21 0915  ceFAZolin (ANCEF) IVPB 2g/100 mL premix        2 g 200 mL/hr over 30 Minutes Intravenous On call to O.R. 07/19/21 [id number] 07/19/21 1156      and started on DVT prophylaxis in the form of  Eliquis .   PT and OT were ordered for total joint protocol. Discharge planning consulted to help with postop disposition and equipment needs.  Patient had a good night on the evening of surgery. They started to get up OOB with therapy on POD #0. Pt was seen during rounds and was ready to go home pending progress with therapy. She worked with therapy on POD #1 and was meeting her goals. Pt was discharged to home later that day in stable condition.  Diet: Cardiac diet Activity: WBAT Follow-up: in 2 weeks Disposition: Home with OPPT at [facility] Discharged Condition: stable   Discharge Instructions     Call MD / Call 911   Complete by: As directed    If you experience chest pain or shortness of breath, CALL 911 and be transported to the hospital emergency room.  If you develope a fever above 101 F, pus (white drainage) or increased drainage or redness at the wound, or calf pain, call your surgeon's office.   Change dressing   Complete by: As directed    You may remove the bulky bandage (ACE wrap and gauze) two days after surgery. You will have an adhesive waterproof bandage underneath. Leave this in place until your first follow-up appointment.   Constipation Prevention   Complete by: As directed  Drink plenty of fluids.  Prune juice may be helpful.  You may use a stool softener, such as Colace (over the counter) 100 mg twice a day.  Use MiraLax (over the counter) for constipation as needed.   Diet - low sodium heart healthy   Complete by: As directed    Do not put a pillow  under the knee. Place it under the heel.   Complete by: As directed    Driving restrictions   Complete by: As directed    No driving for two weeks   Post-operative opioid taper instructions:   Complete by: As directed    POST-OPERATIVE OPIOID TAPER INSTRUCTIONS: It is important to wean off of your opioid medication as soon as possible. If you do not need pain medication after your surgery it is ok to stop day one. Opioids include: Codeine, Hydrocodone(Norco, Vicodin), Oxycodone(Percocet, oxycontin) and hydromorphone amongst others.  Long term and even short term use of opiods can cause: Increased pain response Dependence Constipation Depression Respiratory depression And more.  Withdrawal symptoms can include Flu like symptoms Nausea, vomiting And more Techniques to manage these symptoms Hydrate well Eat regular healthy meals Stay active Use relaxation techniques(deep breathing, meditating, yoga) Do Not substitute Alcohol to help with tapering If you have been on opioids for less than two weeks and do not have pain than it is ok to stop all together.  Plan to wean off of opioids This plan should start within one week post op of your joint replacement. Maintain the same interval or time between taking each dose and first decrease the dose.  Cut the total daily intake of opioids by one tablet each day Next start to increase the time between doses. The last dose that should be eliminated is the evening dose.      TED hose   Complete by: As directed    Use stockings (TED hose) for three weeks on both leg(s).  You may remove them at night for sleeping.   Weight bearing as tolerated   Complete by: As directed       Allergies as of 07/20/2021       Reactions   Codeine Itching, Rash, Hives        Medication List     TAKE these medications    acetaminophen 500 MG tablet Commonly known as: TYLENOL Take 1,000 mg by mouth every 8 (eight) hours as needed for moderate  pain.   b complex vitamins tablet Take 1 tablet by mouth daily.   budesonide 3 MG 24 hr capsule Commonly known as: ENTOCORT EC Take 3 capsules (9 mg total) by mouth daily. What changed: additional instructions   Cholecalciferol 50 MCG (2000 UT) Tabs Take 2,000 Units by mouth daily.   DULoxetine 60 MG capsule Commonly known as: CYMBALTA Take 60 mg by mouth daily.   Eliquis 5 MG Tabs tablet Generic drug: apixaban TAKE 1 TABLET TWICE A DAY   famotidine 40 MG tablet Commonly known as: PEPCID Take 40 mg by mouth 2 (two) times daily.   gabapentin 300 MG capsule Commonly known as: NEURONTIN Take 300 mg by mouth at bedtime as needed (pain).   MAGNESIUM CHLORIDE-CALCIUM PO Take 1 tablet by mouth in the morning and at bedtime.   melatonin 5 MG Tabs Take 5 mg by mouth at bedtime.   methocarbamol 500 MG tablet Commonly known as: ROBAXIN Take 1 tablet (500 mg total) by mouth every 6 (six) hours as needed for muscle spasms.  MULTIVITAMIN ADULT PO Take 1 tablet by mouth daily.   oxyCODONE 5 MG immediate release tablet Commonly known as: Oxy IR/ROXICODONE Take 1-2 tablets (5-10 mg total) by mouth every 6 (six) hours as needed for severe pain.   pravastatin 40 MG tablet Commonly known as: PRAVACHOL Take 40 mg by mouth daily.   thyroid 30 MG tablet Commonly known as: ARMOUR Take 30 mg by mouth daily before breakfast.   traZODone 50 MG tablet Commonly known as: DESYREL Take 50 mg by mouth at bedtime as needed for sleep.   vitamin C 1000 MG tablet Take 1,000 mg by mouth in the morning and at bedtime.               Discharge Care Instructions  (From admission, onward)           Start     Ordered   07/20/21 0000  Weight bearing as tolerated        07/20/21 0749   07/20/21 0000  Change dressing       Comments: You may remove the bulky bandage (ACE wrap and gauze) two days after surgery. You will have an adhesive waterproof bandage underneath. Leave this in  place until your first follow-up appointment.   07/20/21 0749            Follow-up Information     Gaynelle Arabian, MD. Schedule an appointment as soon as possible for a visit on 08/03/2021.   Specialty: Orthopedic Surgery Contact information: [address] [place] [address] [phone]                 Signed: Theresa Duty, PA-C Orthopedic Surgery 07/28/2021, 8:47 AM

## 2021-09-20 ENCOUNTER — Ambulatory Visit
Admission: RE | Admit: 2021-09-20 | Discharge: 2021-09-20 | Disposition: A | Discharge status: Home / Self Care | Payer: Medicare Other | Source: Ambulatory Visit | Attending: Family Medicine | Admitting: Family Medicine

## 2021-09-20 ENCOUNTER — Other Ambulatory Visit: Payer: Self-pay

## 2021-09-20 DIAGNOSIS — Z780 Asymptomatic menopausal state: Secondary | ICD-10-CM

## 2021-10-31 NOTE — Progress Notes (Signed)
Cardiology Office Note Date:  10/31/2021  Patient ID:  Frances Smith, Frances Smith 1946/05/02, MRN [id number] PCP:  Chesley Noon, MD  Cardiologist/Electrophysiologist:  Dr. Rayann Heman    Chief Complaint:  palpitations  History of Present Illness: Frances Smith is a 75 y.o. female with history of PAFlutter/ablated, PAfib > ablated 1999 and 2020 , HLD, hypothyroidism, OSA w/CPAP  I saw her back in 2018  She is feeling very well.  In fact, states that with her PMD and the weight loss clinic there she has been started on Contrave and has lost 12 pounds, feels like she has increased energy, is exercising more and looking forward to a trip to [place] his summer.  She denies any CP or SOB, no exertional incapacities, again, feels like she can and is doing more.  She has infrequent palpitations, a couple weeks prior to the Contrave had a day with about 2 hours of palpitations, this occurs infrequently and not associated with any other symptoms.  No dizziness, near syncope or syncope.  No bleeding or signs of bleeding. She reports her lipids/labs are monitored by her PMD, she brings copy of her last lipid profile, HDL was 80, LDL was 70.  EKG noted stable intervals Labs updated, no changes were made  She comes in today to be seen for Dr. Rayann Heman, last seen by him via telehealth visit April this year.  Complants of knee pain with DJD and some venous insufficiency RLE as well. Felt to be doing well off AAD post ablation.  She was referred to Dr Linus Mako at [facility] as per his discussion with Dr Melford Aase. She saw [facility] so far no plans for surgical intervention, she is wearing support stockings   I saw her 09/10/20 she is doing well, exercising regularly with water aerobics when weather permits and recumbent elliptical machine (she has a bad knee and these work for that). No CP, SOB, unusual DOE> She has rare and fleeting little palpitations only, no symptoms of recurrent Afib No dizzy  spells ,near syncope or syncope. She goes to a weight loss clinic and the last few times SBP has been 150's.  She is a retired Therapist, sports and started to monitor at home with both arm and wrist cuffs that usually correlated pretty well with each other and gets readings 110's-140's/70's Occassionally gets outlier low readings At her last primary visit her BP was 150  She felt like her diet was pretty healy and well rounded though when looking with an eye towards salt noted pretty salt heavy.  Recently had house guest and let that slip again with observation of rise in her BP again. She mentions her weight loss has plateaued and they are considering resuming Contrave We discussed her BP and intermittent high readings, Contrave (not yet started) could elevated HRs and BPs, recommended she discuss further with her weight loss team prior to starting Planned for 40mo visit  She saw Dr. Rayann Heman in Aug 2022, doing well, cleared for an upcoming knee surgery, no changes were made, recommended annual visit  TODAY She had her knee surgery, went well and recently just completed her therapy. Since her surgery though she has had some palpitations They are brief, maybe 30 seconds, perhaps a couple times a week, wonders of the surgery has provoked some AFib. Prior to her knee surgery had not been bothered at all by her AFib off the propafenone  She wonders if she should go back on the AAD. She did  catch one episode of her palpitations on her watch APP but forgot her phone today  NO CP, no SOB No dizzy spells, near syncope or syncope No bleeding or signs of bleeding  She is looking forward to a month trip to [place], mentions she and her husband really need the break  Sees her PMD 2x/year, has labs done regularly  AFib Hx Diagnosed as far back as 1999 AFIB ablation 1999 in [place] [facility]VI and CTI ablation 07/19/2019, here, Dr. Rayann Heman AAD Propafenone goes as far back as 2010 >> stopped post ablation Nov 2020  Past  Medical History:  Diagnosis Date   Anal fissure    Anxiety    Arthritis    Atrial fibrillation ([facility])    Colon polyps    Depression    Diverticulosis    Dysrhythmia    GERD (gastroesophageal reflux disease)    H/O: hysterectomy    Heart murmur    as a child   History of bronchitis as a child    History of head injury    History of measles    History of mumps    Hyperlipidemia    Hypothyroidism    IBS (irritable bowel syndrome)    Menopause    OSA on CPAP    Osteopenia    Paroxysmal atrial fibrillation ([facility])    Restless leg syndrome    Thyroid disease    Venous insufficiency    right leg    Past Surgical History:  Procedure Laterality Date   ATRIAL FIBRILLATION ABLATION N/A 07/19/2019   Procedure: ATRIAL FIBRILLATION ABLATION;  Surgeon: Thompson Grayer, MD;  Location: [facility];  Service: Cardiovascular;  Laterality: N/A;   BREAST CYST ASPIRATION     multiple but not sure when   BREAST CYST INCISION AND DRAINAGE Left 1985   BREAST SURGERY     aspiration of cyst / left    COLONOSCOPY  2010   ENDOMETRIAL ABLATION     PARTIAL KNEE ARTHROPLASTY Left 08/10/2016   Procedure: LEFT KNEE UNICOMPARTMENTAL ARTHROPLASTY;  Surgeon: Gaynelle Arabian, MD;  Location: [facility];  Service: Orthopedics;  Laterality: Left;   TONSILLECTOMY AND ADENOIDECTOMY  1949   TOTAL KNEE ARTHROPLASTY Right 07/19/2021   Procedure: TOTAL KNEE ARTHROPLASTY;  Surgeon: Gaynelle Arabian, MD;  Location: [facility];  Service: Orthopedics;  Laterality: Right;   TREATMENT FISTULA ANAL  1988   VAGINAL HYSTERECTOMY  1984    Current Outpatient Medications  Medication Sig Dispense Refill   acetaminophen (TYLENOL) 500 MG tablet Take 1,000 mg by mouth every 8 (eight) hours as needed for moderate pain.     Ascorbic Acid (VITAMIN C) 1000 MG tablet Take 1,000 mg by mouth in the morning and at bedtime.     b complex vitamins tablet Take 1 tablet by mouth daily.     budesonide (ENTOCORT EC) 3 MG 24 hr capsule Take 3  capsules (9 mg total) by mouth daily. (Patient taking differently: Take 9 mg by mouth daily. As needed) 270 capsule 3   Cholecalciferol 50 MCG (2000 UT) TABS Take 2,000 Units by mouth daily.     DULoxetine (CYMBALTA) 60 MG capsule Take 60 mg by mouth daily.     ELIQUIS 5 MG TABS tablet TAKE 1 TABLET TWICE A DAY 180 tablet 2   famotidine (PEPCID) 40 MG tablet Take 40 mg by mouth 2 (two) times daily.     gabapentin (NEURONTIN) 300 MG capsule Take 300 mg by mouth at bedtime as needed (pain).  MAGNESIUM CHLORIDE-CALCIUM PO Take 1 tablet by mouth in the morning and at bedtime.     melatonin 5 MG TABS Take 5 mg by mouth at bedtime.     methocarbamol (ROBAXIN) 500 MG tablet Take 1 tablet (500 mg total) by mouth every 6 (six) hours as needed for muscle spasms. 40 tablet 0   Multiple Vitamins-Minerals (MULTIVITAMIN ADULT PO) Take 1 tablet by mouth daily.     oxyCODONE (OXY IR/ROXICODONE) 5 MG immediate release tablet Take 1-2 tablets (5-10 mg total) by mouth every 6 (six) hours as needed for severe pain. 42 tablet 0   pravastatin (PRAVACHOL) 40 MG tablet Take 40 mg by mouth daily.       thyroid (ARMOUR) 30 MG tablet Take 30 mg by mouth daily before breakfast.     traZODone (DESYREL) 50 MG tablet Take 50 mg by mouth at bedtime as needed for sleep.     No current facility-administered medications for this visit.    Allergies:   Codeine   Social History:  The patient  reports that she quit smoking about 56 years ago. Her smoking use included cigarettes. She has a 2.50 pack-year smoking history. She has never used smokeless tobacco. She reports that she does not drink alcohol and does not use drugs.   Family History:  The patient's family history includes Allergies in her mother; Anxiety disorder in her brother, daughter, and son; Asthma in her mother; Atrial fibrillation in her father; Breast cancer in her maternal aunt, maternal grandmother, and mother; Colon cancer (age of onset: 2) in her cousin;  Colon cancer (age of onset: 42) in her maternal aunt; Depression in her daughter and son; Heart failure (age of onset: 24) in her father; Thyroid disease in her brother.  ROS:  Please see the history of present illness.   All other systems are reviewed and otherwise negative.   PHYSICAL EXAM:  VS:  There were no vitals taken for this visit. BMI: There is no height or weight on file to calculate BMI. Well nourished, well developed, in no acute distress  HEENT: normocephalic, atraumatic  Neck: no JVD, carotid bruits or masses Cardiac:  RRR, no significant murmurs, no rubs, or gallops Lungs:  CTA b/l, no wheezing, rhonchi or rales  Abd: soft, nontender MS: no deformity or atrophy Ext: no edema Skin: warm and dry, no rash Neuro:  No gross deficits appreciated Psych: euthymic mood, full affect    EKG:  not done today  07/19/2019: EPS/ablation, Dr. Rayann Heman CONCLUSIONS: 1. Sinus rhythm upon presentation.   2. Intracardiac echo reveals a moderate sized left atrium with four separate pulmonary veins without evidence of pulmonary vein stenosis. 3. Return of electrical conduction within all four PVs at baseline. 4, Successful electrical re-isolation and anatomical encircling of all four pulmonary veins with radiofrequency current.   A WACA approach was used 4. Cavo-tricuspid isthmus ablation was performed with complete bidirectional isthmus block achieved.  5. No inducible arrhythmias following ablation both on and off of Isuprel 6. No early apparent complications.    10/02/2018; TTE Study Conclusions  - Left ventricle: The cavity size was mildly reduced. Wall    thickness was increased in a pattern of mild LVH. Systolic    function was normal. The estimated ejection fraction was in the    range of 60% to 65%. Wall motion was normal; there were no    regional wall motion abnormalities. Left ventricular diastolic    function parameters were normal for the patient&'s  age.  - Aortic valve:  There was no regurgitation.  - Mitral valve: There was trivial regurgitation.  - Left atrium: The atrium was mildly dilated.  - Right ventricle: Systolic function was normal.  - Atrial septum: No defect or patent foramen ovale was identified.  - Tricuspid valve: There was trivial regurgitation.  - Pulmonic valve: There was no significant regurgitation.   Impressions:  - Normal LV EF, no significant diastolic dysfunction. No    significant valvular abnormalities.    07/28/16: [facility] Call Study Highlights  Nuclear stress EF: 71%. There was no ST segment deviation noted during stress. The study is normal. This is a low risk study. The left ventricular ejection fraction is hyperdynamic (>65%).       09/21/11: stress myoview Overall Impression:  Normal stress nuclear study EF: 77% NL LV Function; NL Wall Motion   Recent Labs: 07/14/2021: ALT 23 07/20/2021: BUN 12; Creatinine, Ser 0.77; Hemoglobin 11.9; Platelets 178; Potassium 4.1; Sodium 140  No results found for requested labs within last 8760 hours.   CrCl cannot be calculated (Patient's most recent lab result is older than the maximum 21 days allowed.).   Wt Readings from Last 3 Encounters:  07/19/21 186 lb (84.4 kg)  07/14/21 186 lb (84.4 kg)  06/30/21 193 lb 12.8 oz (87.9 kg)     Other studies reviewed: Additional studies/records reviewed today include: summarized above  ASSESSMENT AND PLAN:   1. PAfib     CHA2DS2Vasc is  3 (4 if developed HTN), on Eliquis, appropriately dosed     A recent up tick in palpitations   2. Some higher BPs intermittently at home/clinic    No established diagnosis of HTN    Disposition:  she will email me her watch tracing, I think if PACs we can start with low dose BB, vs resuming her AAD, pending this, though plan for 93mo, will establish care with Dr. Quentin Ore.    Current medicines are reviewed at length with the patient today.  The patient did not have any concerns regarding  medicines.  Haywood Lasso, PA-C 10/31/2021 2:08 PM     [address] [place] [address] [phone] (office)  [phone] (fax)

## 2021-11-02 ENCOUNTER — Ambulatory Visit (INDEPENDENT_AMBULATORY_CARE_PROVIDER_SITE_OTHER): Payer: Medicare Other | Admitting: Physician Assistant

## 2021-11-02 ENCOUNTER — Other Ambulatory Visit: Payer: Self-pay

## 2021-11-02 ENCOUNTER — Encounter: Payer: Self-pay | Admitting: Physician Assistant

## 2021-11-02 VITALS — BP 138/78 | HR 74 | Ht 62.0 in | Wt 189.4 lb

## 2021-11-02 DIAGNOSIS — I480 Paroxysmal atrial fibrillation: Secondary | ICD-10-CM | POA: Diagnosis not present

## 2021-11-02 NOTE — Patient Instructions (Signed)
Medication Instructions:   Your physician recommends that you continue on your current medications as directed. Please refer to the Current Medication list given to you today.  *If you need a refill on your cardiac medications before your next appointment, please call your pharmacy*   Lab Work: [place]   If you have labs (blood work) drawn today and your tests are completely normal, you will receive your results only by: [place] (if you have MyChart) OR A paper copy in the mail If you have any lab test that is abnormal or we need to change your treatment, we will call you to review the results.   Testing/Procedures: NONE ORDERED  TODAY    Follow-Up: At [facility], you and your health needs are our priority.  As part of our continuing mission to provide you with exceptional heart care, we have created designated Provider Care Teams.  These Care Teams include your primary Cardiologist (physician) and Advanced Practice Providers (APPs -  Physician Assistants and Nurse Practitioners) who all work together to provide you with the care you need, when you need it.  We recommend signing up for the patient portal called "MyChart".  Sign up information is provided on this After Visit Summary.  MyChart is used to connect with patients for Virtual Visits (Telemedicine).  Patients are able to view lab/test results, encounter notes, upcoming appointments, etc.  Non-urgent messages can be sent to your provider as well.   To learn more about what you can do with MyChart, go to [email or web address].    Your next appointment:   6 month(s)  The format for your next appointment:   In Person  Provider:   Lars Mage, MD{    Other Instructions:

## 2021-11-05 ENCOUNTER — Telehealth: Payer: Self-pay | Admitting: *Deleted

## 2021-11-05 NOTE — Telephone Encounter (Signed)
Spoke with patient aware of feedback and verbalized understanding. Patient will be in [place] for a while so February the best time for appointment Patient scheduled for 01-10-22. Patient will continue to follow up with tracking rhythms.

## 2021-11-05 NOTE — Telephone Encounter (Signed)
-----   Message from [place], [place] sent at 11/05/2021 10:23 AM EST ----- I have reviewed her tracing she emailed, It does not look like Afib to me and don't think we need to resume her antiarrhythmic drug (propafenone) yet.  I would like to have her continue to monitor symptoms and rhythm with her watch as she has been and make sure she has follow up in place for the next couple months.

## 2021-12-09 ENCOUNTER — Other Ambulatory Visit: Payer: Self-pay | Admitting: Internal Medicine

## 2021-12-09 DIAGNOSIS — I480 Paroxysmal atrial fibrillation: Secondary | ICD-10-CM

## 2021-12-09 NOTE — Telephone Encounter (Signed)
Eliquis 5mg  refill request received. Patient is 76 years old, weight-85.9kg, Crea-0.77 on 07/20/2021, Diagnosis-Afib, and last seen by Tommye Standard on 11/02/2021. Dose is appropriate based on dosing criteria. Will send in refill to requested pharmacy.

## 2022-01-08 NOTE — Progress Notes (Signed)
Cardiology Office Note Date:  01/08/2022  Patient ID:  Frances, Smith May 04, 1946, MRN [id number] PCP:  Chesley Noon, MD  Cardiologist/Electrophysiologist:  Dr. Rayann Heman    Chief Complaint:  planned f/u  History of Present Illness: Frances Smith is a 76 y.o. female with history of PAFlutter/ablated, PAfib > ablated 1999 and 2020 , HLD, hypothyroidism, OSA w/CPAP   She saw Dr. Rayann Heman in Aug 2022, doing well, cleared for an upcoming knee surgery, no changes were made, recommended annual visit  I saw her Nov 2022 She had her knee surgery, went well and recently just completed her therapy. Since her surgery though she has had some palpitations They are brief, maybe 30 seconds, perhaps a couple times a week, wonders of the surgery has provoked some AFib. Prior to her knee surgery had not been bothered at all by her AFib off the propafenone  She wonders if she should go back on the AAD. She did catch one episode of her palpitations on her watch APP but forgot her phone today NO CP, no SOB No dizzy spells, near syncope or syncope No bleeding or signs of bleeding She is looking forward to a month trip to [place], mentions she and her husband really need the break Sees her PMD 2x/year, has labs done regularly Planned to have her email me her kardia tracings for plan, perhaps BB vs resumption of AAD pending findings  Her tracings were reviewed by myself, not felt to be AFib, not planned to resume her propafenone or add on BB, monitor symptoms and f/u in a few months  TODAY She is doing well. She has had some brief palpitations, many very quick and unable to catch on her Jodelle Red, a couple she did and read as Afib These are not day in or day out, described as occasional and not overly symptomatic. She denies CP, SOB She enjoyed her vacation in [place], unfortunately while there her brother suffered a serious MVA and remains in the hospital 8 weeks later having yet another surgery. She  denies any bleeding or signs of bleeding  AFib Hx Diagnosed as far back as 1999 AFIB ablation 1999 in [place] PVI and CTI ablation 07/19/2019, Dr. Rayann Heman AAD Propafenone goes as far back as 2010 >> stopped post ablation Nov 2020  Past Medical History:  Diagnosis Date   Anal fissure    Anxiety    Arthritis    Atrial fibrillation ([place])    Colon polyps    Depression    Diverticulosis    Dysrhythmia    GERD (gastroesophageal reflux disease)    H/O: hysterectomy    Heart murmur    as a child   History of bronchitis as a child    History of head injury    History of measles    History of mumps    Hyperlipidemia    Hypothyroidism    IBS (irritable bowel syndrome)    Menopause    OSA on CPAP    Osteopenia    Paroxysmal atrial fibrillation (H[facility])    Restless leg syndrome    Thyroid disease    Venous insufficiency    right leg    Past Surgical History:  Procedure Laterality Date   ATRIAL FIBRILLATION ABLATION N/A 07/19/2019   Procedure: ATRIAL FIBRILLATION ABLATION;  Surgeon: Thompson Grayer, MD;  Location: [facility];  Service: Cardiovascular;  Laterality: N/A;   BREAST CYST ASPIRATION     multiple but not sure when  BREAST CYST INCISION AND DRAINAGE Left 1985   BREAST SURGERY     aspiration of cyst / left    COLONOSCOPY  2010   ENDOMETRIAL ABLATION     PARTIAL KNEE ARTHROPLASTY Left 08/10/2016   Procedure: LEFT KNEE UNICOMPARTMENTAL ARTHROPLASTY;  Surgeon: Gaynelle Arabian, MD;  Location: [facility];  Service: Orthopedics;  Laterality: Left;   TONSILLECTOMY AND ADENOIDECTOMY  1949   TOTAL KNEE ARTHROPLASTY Right 07/19/2021   Procedure: TOTAL KNEE ARTHROPLASTY;  Surgeon: Gaynelle Arabian, MD;  Location: [facility];  Service: Orthopedics;  Laterality: Right;   TREATMENT FISTULA ANAL  1988   VAGINAL HYSTERECTOMY  1984    Current Outpatient Medications  Medication Sig Dispense Refill   acetaminophen (TYLENOL) 500 MG tablet Take 1,000 mg by mouth every 8 (eight) hours as needed  for moderate pain.     apixaban (ELIQUIS) 5 MG TABS tablet TAKE 1 TABLET TWICE A DAY 180 tablet 2   Ascorbic Acid (VITAMIN C) 1000 MG tablet Take 1,000 mg by mouth in the morning and at bedtime.     b complex vitamins tablet Take 1 tablet by mouth daily.     budesonide (ENTOCORT EC) 3 MG 24 hr capsule Take 3 capsules (9 mg total) by mouth daily. (Patient not taking: Reported on 11/02/2021) 270 capsule 3   Cholecalciferol 50 MCG (2000 UT) TABS Take 2,000 Units by mouth daily.     DULoxetine (CYMBALTA) 60 MG capsule Take 60 mg by mouth daily.     famotidine (PEPCID) 40 MG tablet Take 40 mg by mouth 2 (two) times daily.     gabapentin (NEURONTIN) 300 MG capsule Take 300 mg by mouth at bedtime as needed (pain).     MAGNESIUM CHLORIDE-CALCIUM PO Take 1 tablet by mouth in the morning and at bedtime.     melatonin 5 MG TABS Take 5 mg by mouth at bedtime. (Patient not taking: Reported on 11/02/2021)     methocarbamol (ROBAXIN) 500 MG tablet Take 1 tablet (500 mg total) by mouth every 6 (six) hours as needed for muscle spasms. (Patient not taking: Reported on 11/02/2021) 40 tablet 0   Multiple Vitamins-Minerals (MULTIVITAMIN ADULT PO) Take 1 tablet by mouth daily.     oxyCODONE (OXY IR/ROXICODONE) 5 MG immediate release tablet Take 1-2 tablets (5-10 mg total) by mouth every 6 (six) hours as needed for severe pain. (Patient not taking: Reported on 11/02/2021) 42 tablet 0   pravastatin (PRAVACHOL) 40 MG tablet Take 40 mg by mouth daily.       thyroid (ARMOUR) 30 MG tablet Take 30 mg by mouth daily before breakfast.     traZODone (DESYREL) 50 MG tablet Take 50 mg by mouth at bedtime as needed for sleep.     No current facility-administered medications for this visit.    Allergies:   Codeine   Social History:  The patient  reports that she quit smoking about 57 years ago. Her smoking use included cigarettes. She has a 2.50 pack-year smoking history. She has never used smokeless tobacco. She reports that  she does not drink alcohol and does not use drugs.   Family History:  The patient's family history includes Allergies in her mother; Anxiety disorder in her brother, daughter, and son; Asthma in her mother; Atrial fibrillation in her father; Breast cancer in her maternal aunt, maternal grandmother, and mother; Colon cancer (age of onset: 48) in her cousin; Colon cancer (age of onset: 63) in her maternal aunt; Depression in her daughter and  son; Heart failure (age of onset: 14) in her father; Thyroid disease in her brother.  ROS:  Please see the history of present illness.   All other systems are reviewed and otherwise negative.   PHYSICAL EXAM:  VS:  There were no vitals taken for this visit. BMI: There is no height or weight on file to calculate BMI. Well nourished, well developed, in no acute distress  HEENT: normocephalic, atraumatic  Neck: no JVD, carotid bruits or masses Cardiac:  RRR, no significant murmurs, no rubs, or gallops Lungs:  CTA b/l, no wheezing, rhonchi or rales  Abd: soft, nontender MS: no deformity or atrophy Ext: no edema Skin: warm and dry, no rash Neuro:  No gross deficits appreciated Psych: euthymic mood, full affect    EKG:  done today and reviewed by myself SR 69bpm  07/19/2019: EPS/ablation, Dr. Rayann Heman CONCLUSIONS: 1. Sinus rhythm upon presentation.   2. Intracardiac echo reveals a moderate sized left atrium with four separate pulmonary veins without evidence of pulmonary vein stenosis. 3. Return of electrical conduction within all four PVs at baseline. 4, Successful electrical re-isolation and anatomical encircling of all four pulmonary veins with radiofrequency current.   A WACA approach was used 4. Cavo-tricuspid isthmus ablation was performed with complete bidirectional isthmus block achieved.  5. No inducible arrhythmias following ablation both on and off of Isuprel 6. No early apparent complications.    10/02/2018; TTE Study Conclusions  - Left  ventricle: The cavity size was mildly reduced. Wall    thickness was increased in a pattern of mild LVH. Systolic    function was normal. The estimated ejection fraction was in the    range of 60% to 65%. Wall motion was normal; there were no    regional wall motion abnormalities. Left ventricular diastolic    function parameters were normal for the patient&'s age.  - Aortic valve: There was no regurgitation.  - Mitral valve: There was trivial regurgitation.  - Left atrium: The atrium was mildly dilated.  - Right ventricle: Systolic function was normal.  - Atrial septum: No defect or patent foramen ovale was identified.  - Tricuspid valve: There was trivial regurgitation.  - Pulmonic valve: There was no significant regurgitation.   Impressions:  - Normal LV EF, no significant diastolic dysfunction. No    significant valvular abnormalities.    07/28/16: Leane Call Study Highlights  Nuclear stress EF: 71%. There was no ST segment deviation noted during stress. The study is normal. This is a low risk study. The left ventricular ejection fraction is hyperdynamic (>65%).       09/21/11: stress myoview Overall Impression:  Normal stress nuclear study EF: 77% NL LV Function; NL Wall Motion   Recent Labs: 07/14/2021: ALT 23 07/20/2021: BUN 12; Creatinine, Ser 0.77; Hemoglobin 11.9; Platelets 178; Potassium 4.1; Sodium 140  No results found for requested labs within last 8760 hours.   CrCl cannot be calculated (Patient's most recent lab result is older than the maximum 21 days allowed.).   Wt Readings from Last 3 Encounters:  11/02/21 189 lb 6.4 oz (85.9 kg)  07/19/21 186 lb (84.4 kg)  07/14/21 186 lb (84.4 kg)     Other studies reviewed: Additional studies/records reviewed today include: summarized above  ASSESSMENT AND PLAN:   1. PAfib     CHA2DS2Vasc is  3 (4 if developed HTN), on Eliquis, appropriately dosed     Her burden of palpitations Afib is OK with her right  now, does not  think she has enough or is bothered enough by it to resume/add medicines back yet.  Labs done regularly with her PMD Today's Jodelle Red tracing is regular with no clear P wave her EKG is clearly SR   2. Some higher BPs intermittently at home/clinic     No established diagnosis of HTN     No changes    Disposition:  With Dr. Jackalyn Lombard slow down/departure, she is most comfortable establishing with one of our other EP MD's along with myself.  Will have her see Dr. Quentin Ore in 66mo, in clniic sooner if needed    Current medicines are reviewed at length with the patient today.  The patient did not have any concerns regarding medicines.  Haywood Lasso, PA-C 01/08/2022 9:12 AM     [facility] [address] [place] [address] [phone] (office)  [phone] (fax)

## 2022-01-10 ENCOUNTER — Other Ambulatory Visit: Payer: Self-pay

## 2022-01-10 ENCOUNTER — Encounter: Payer: Self-pay | Admitting: Physician Assistant

## 2022-01-10 ENCOUNTER — Ambulatory Visit (INDEPENDENT_AMBULATORY_CARE_PROVIDER_SITE_OTHER): Payer: Medicare Other | Admitting: Physician Assistant

## 2022-01-10 VITALS — BP 132/76 | HR 77 | Ht 62.0 in | Wt 195.4 lb

## 2022-01-10 DIAGNOSIS — I480 Paroxysmal atrial fibrillation: Secondary | ICD-10-CM

## 2022-01-10 NOTE — Patient Instructions (Signed)
Medication Instructions:   Your physician recommends that you continue on your current medications as directed. Please refer to the Current Medication list given to you today.  *If you need a refill on your cardiac medications before your next appointment, please call your pharmacy*  Lab Work:  [place]   If you have labs (blood work) drawn today and your tests are completely normal, you will receive your results only by: [place] [place] (if you have MyChart) OR A paper copy in the mail If you have any lab test that is abnormal or we need to change your treatment, we will call you to review the results.   Testing/Procedures: NONE ORDERED  TODAY     Follow-Up: At [facility], you and your health needs are our priority.  As part of our continuing mission to provide you with exceptional heart care, we have created designated Provider Care Teams.  These Care Teams include your primary Cardiologist (physician) and Advanced Practice Providers (APPs -  Physician Assistants and Nurse Practitioners) who all work together to provide you with the care you need, when you need it.  We recommend signing up for the patient portal called "MyChart".  Sign up information is provided on this After Visit Summary.  MyChart is used to connect with patients for Virtual Visits (Telemedicine).  Patients are able to view lab/test results, encounter notes, upcoming appointments, etc.  Non-urgent messages can be sent to your provider as well.   To learn more about what you can do with MyChart, go to [email or web address].    Your next appointment:   6 month(s)  The format for your next appointment:   In Person  Provider:   Allegra Lai, MD

## 2022-01-11 NOTE — Addendum Note (Signed)
Addended by: Jordan Likes on: 01/11/2022 12:05 PM   Modules accepted: Orders

## 2022-03-01 ENCOUNTER — Other Ambulatory Visit: Payer: Self-pay | Admitting: Family Medicine

## 2022-03-01 DIAGNOSIS — Z1231 Encounter for screening mammogram for malignant neoplasm of breast: Secondary | ICD-10-CM

## 2022-03-04 ENCOUNTER — Ambulatory Visit
Admission: RE | Admit: 2022-03-04 | Discharge: 2022-03-04 | Disposition: A | Discharge status: Home / Self Care | Payer: Medicare Other | Source: Ambulatory Visit | Attending: Family Medicine | Admitting: Family Medicine

## 2022-03-04 DIAGNOSIS — Z1231 Encounter for screening mammogram for malignant neoplasm of breast: Secondary | ICD-10-CM

## 2022-07-04 ENCOUNTER — Ambulatory Visit: Payer: Medicare Other | Admitting: Internal Medicine

## 2022-08-04 NOTE — Progress Notes (Unsigned)
Cardiology Office Note Date:  08/04/2022  Patient ID:  Frances, Smith July 29, 1946, MRN [id number] PCP:  Chesley Noon, MD  Cardiologist/Electrophysiologist:  Dr. Rayann Heman    Chief Complaint:  *** 70mo History of Present Illness: Frances ANSELMOis a 76y.o. female with history of PAFlutter/ablated, PAfib > ablated 1999 and 2020 , HLD, hypothyroidism, OSA w/CPAP   She saw Dr. ARayann Hemanin Aug 2022, doing well, cleared for an upcoming knee surgery, no changes were made, recommended annual visit  I saw her Nov 2022 She had her knee surgery, went well and recently just completed her therapy. Since her surgery though she has had some palpitations They are brief, maybe 30 seconds, perhaps a couple times a week, wonders of the surgery has provoked some AFib. Prior to her knee surgery had not been bothered at all by her AFib off the propafenone  She wonders if she should go back on the AAD. She did catch one episode of her palpitations on her watch APP but forgot her phone today NO CP, no SOB No dizzy spells, near syncope or syncope No bleeding or signs of bleeding She [place] really [place] the break Sees her PMD 2x/year, has labs done regularly Planned to have her email me her kardia tracings for plan, perhaps BB vs resumption of AAD pending findings  Her tracings were reviewed by myself, not felt to be AFib, not planned to resume her propafenone or add on BB, monitor symptoms and f/u in a few months  I saw her 01/10/22 She is doing well. She has had some brief palpitations, many very quick and unable to catch on her KJodelle Red a couple she did and read as Afib These are not day in or day out, described as occasional and not overly symptomatic. She denies CP, SOB [place] her brother suffered a serious MVA and remains in the hospital 8 weeks later having yet another  surgery. She denies any bleeding or signs of bleeding She was happy with her current arrhythmia burden, no changes were made. Planned to transition to Dr. LQuentin Ore *** eliquis, bleeding, labs, dose *** burden *** symptoms  AFib Hx Diagn[place] as far back [place] 1999 [place], Dr. ARayann HemanAAD Propafenone goes as far back as 2010 >> stopped post ablation Nov 2020  Past Medical History:  Diagnosis Date   Anal fissure    Anxiety    Arthritis    [place]    Colon polyps    Depression    Diverticulosis    Dysrhythmia    GERD (gastroesophageal reflux disease)    H/O: hysterectomy    Heart murmur    as a child   History of bronchitis as a child    History of head injury    History of measles    History of mumps    Hyperlipidemia    Hypothyroidism    IBS (irritable bowel syndrome)    Menopause    OSA on CPAP    Osteopenia    Paroxysmal atrial fibrillation ([facility])    Restless leg syndrome    Thyroid disease    Venous insufficiency    right leg    Past Surgical History:  Procedure Laterality Date   ATRIAL FIBRILLATION ABLATION N/A 07/19/2019   Procedure: ATRIAL FIBRILLATION ABLATION;  Surgeon: Thompson Grayer, MD;  Location: [facility] [facility];  Service: Cardiovascular;  Laterality: N/A;   BREAST CYST ASPIRATION     multiple but not sure when   BREAST CYST INCISION AND DRAINAGE Left 1985   BREAST SURGERY     aspiration of cyst / left    COLONOSCOPY  2010   ENDOMETRIAL ABLATION     PARTIAL KNEE ARTHROPLASTY Left 08/10/2016   Procedure: LEFT KNEE UNICOMPARTMENTAL ARTHROPLASTY;  Surgeon: Gaynelle Arabian, MD;  [facility]: [facility];  Service: Orthopedics;  Laterality: Left;   TONSILLECTOMY AND ADENOIDECTOMY  1949   TOTAL KNEE ARTHROPLASTY Right 07/19/2021   Procedure: TOTAL KNEE ARTHROPLASTY;  Surgeon: Gaynelle Arabian, MD;  [facility];  Service: Orthopedics;  Laterality: Right;   TREATMENT FISTULA ANAL  1988    VAGINAL HYSTERECTOMY  1984    Current Outpatient Medications  Medication Sig Dispense Refill   acetaminophen (TYLENOL) 500 MG tablet Take 1,000 mg by mouth every 8 (eight) hours as needed for moderate pain.     apixaban (ELIQUIS) 5 MG TABS tablet TAKE 1 TABLET TWICE A DAY 180 tablet 2   Ascorbic Acid (VITAMIN C) 1000 MG tablet Take 1,000 mg by mouth in the morning and at bedtime.     b complex vitamins tablet Take 1 tablet by mouth daily.     budesonide (ENTOCORT EC) 3 MG 24 hr capsule Take 3 capsules (9 mg total) by mouth daily. 270 capsule 3   Cholecalciferol 50 MCG (2000 UT) TABS Take 2,000 Units by mouth daily.     DULoxetine (CYMBALTA) 60 MG capsule Take 60 mg by mouth daily.     famotidine (PEPCID) 40 MG tablet Take 40 mg by mouth 2 (two) times daily.     gabapentin (NEURONTIN) 300 MG capsule Take 300 mg by mouth at bedtime as needed (pain).     MAGNESIUM CHLORIDE-CALCIUM PO Take 1 tablet by mouth in the morning and at bedtime.     melatonin 5 MG TABS Take 5 mg by mouth at bedtime.     Multiple Vitamins-Minerals (MULTIVITAMIN ADULT PO) Take 1 tablet by mouth daily.     pravastatin (PRAVACHOL) 40 MG tablet Take 40 mg by mouth daily.       thyroid (ARMOUR) 30 MG tablet Take 30 mg by mouth daily before breakfast.     traZODone (DESYREL) 50 MG tablet Take 50 mg by mouth at bedtime as needed for sleep.     No current facility-administered medications for this visit.    Allergies:   Codeine   Social History:  The patient  reports that she quit smoking about 57 years ago. Her smoking use included cigarettes. She has a 2.50 pack-year smoking history. She has never been exposed to tobacco smoke. She has never used smokeless tobacco. She reports that she does not drink alcohol and does not use drugs.   Family History:  The patient's family history includes Allergies in her mother; Anxiety disorder in her brother, daughter, and son; Asthma in her mother; Atrial fibrillation in her father;  Breast cancer in her maternal aunt, maternal grandmother, and mother; Colon cancer (age of onset: 39) in her cousin; Colon cancer (age of onset: 66) in her maternal aunt; Depression in her daughter and son; Heart failure (age of onset: 64) in her father; Thyroid disease in her brother.  ROS:  Please see the history of present illness.   All other systems are reviewed and otherwise negative.   PHYSICAL EXAM:  VS:  There were no vitals taken for this visit. BMI: There is no height or weight on file to calculate BMI. Well nourished, well developed, in no acute distress  HEENT: normocephalic, atraumatic  Neck: no JVD, carotid bruits or masses Cardiac:  RRR, no significant murmurs, no rubs, or gallops Lungs:  CTA b/l, no wheezing, rhonchi or rales  Abd: soft, nontender MS: no deformity or atrophy Ext: no edema Skin: warm and dry, no rash Neuro:  No gross deficits appreciated Psych: euthymic mood, full affect    EKG:  not done today   07/19/2019: EPS/ablation, Dr. Rayann Heman CONCLUSIONS: 1. Sinus rhythm upon presentation.   2. Intracardiac echo reveals a moderate sized left atrium with four separate pulmonary veins without evidence of pulmonary vein stenosis. 3. Return of electrical conduction within all four PVs at baseline. 4, Successful electrical re-isolation and anatomical encircling of all four pulmonary veins with radiofrequency current.   A WACA approach was used 4. Cavo-tricuspid isthmus ablation was performed with complete bidirectional isthmus block achieved.  5. No inducible arrhythmias following ablation both on and off of Isuprel 6. No early apparent complications.    10/02/2018; TTE Study Conclusions  - Left ventricle: The cavity size was mildly reduced. Wall    thickness was increased in a pattern of mild LVH. Systolic    function was normal. The estimated ejection fraction was in the    range of 60% to 65%. Wall motion was normal; there were no    regional wall motion  abnormalities. Left ventricular diastolic    function parameters were normal for the patient&'s age.  - Aortic valve: There was no regurgitation.  - Mitral valve: There was trivial regurgitation.  - Left atrium: The atrium was mildly dilated.  - Right ventricle: Systolic function was normal.  - Atrial septum: No defect or patent foramen ovale was identified.  - Tricuspid valve: There was trivial regurgitation.  - Pulmonic valve: There was no significant regurgitation.   Impressions:  - Normal LV EF, no significant diastolic dysfunction. No    significant valvular abnormalities.    07/28/16: Leane Call Study Highlights  Nuclear stress EF: 71%. There was no ST segment deviation noted during stress. The study is normal. This is a low risk study. The left ventricular ejection fraction is hyperdynamic (>65%).       09/21/11: stress myoview Overall Impression:  Normal stress nuclear study EF: 77% NL LV Function; NL Wall Motion   Recent Labs: No results found for requested labs within last 365 days.  No results found for requested labs within last 365 days.   CrCl cannot be calculated (Patient's most recent lab result is older than the maximum 21 days allowed.).   Wt Readings from Last 3 Encounters:  01/10/22 195 lb 6.4 oz (88.6 kg)  11/02/21 189 lb 6.4 oz (85.9 kg)  07/19/21 186 lb (84.4 kg)     Other studies reviewed: Additional studies/records reviewed today include: summarized above  ASSESSMENT AND PLAN:   1. PAfib     CHA2DS2Vasc is  3 (4 if developed HTN), on Eliquis, *** appropriately dosed     ***   2. Some higher BPs intermittently at home/clinic     *** No established diagnosis of HTN     No changes    Disposition:  ***    Current medicines are reviewed at length with the patient today.  The patient did not have any concerns regarding medicines.  Haywood Lasso, PA-C 08/04/2022 3:39 PM  [address] [place] [address] [phone] (office)  [phone] (fax)

## 2022-08-05 ENCOUNTER — Ambulatory Visit: Payer: Medicare Other | Admitting: Primary Care

## 2022-08-07 NOTE — Progress Notes (Unsigned)
$'@Patient'j$  ID: Frances Smith, female    DOB: 08-19-1946, 76 y.o.   MRN: [id number]  No chief complaint on file.   Referring provider: Chesley Noon, MD  HPI: 76 year old female, former smoker. PMH significant for OSA, afib, GERD, hypothyroidism, hyperlipidemia, GAD. Patient of Dr. Halford Chessman, last seen on 10/01/19. Maintained on auto CPAP.   08/08/2022 Patient presents today for OSA follow-up. Needing supplies renewed.       PSG 08/01/12 >> AHI 0 HST 02/22/18 >> AHI 10.6, SaO2 low 86% Auto CPAP 06/13/18 to 07/12/18 >> used on 29 of 30 nights with average 6 hrs 37 min.  Average AHI 0.8 with median CPAP 7 and 95 th percentile CPAP 9 cm H2O         Allergies  Allergen Reactions   Codeine Itching, Rash and Hives    Immunization History  Administered Date(s) Administered   Hep A / Hep B 02/12/2015   Hepatitis B, PED/ADOLESCENT 03/16/2015, 09/15/2015   Influenza Split 10/17/2011, 08/03/2017   Influenza, High Dose Seasonal PF 10/18/2016, 09/19/2017, 08/29/2018, 08/21/2019   Influenza, Seasonal, Injecte, Preservative Fre 02/12/2015, 09/21/2015   Influenza,inj,quad, With Preservative 09/19/2017   Influenza,trivalent, recombinat, inj, PF 10/17/2011, 08/24/2012   PFIZER(Purple Top)SARS-COV-2 Vaccination 12/05/2019, 12/24/2019   Pneumococcal Conjugate-13 10/14/2015   Pneumococcal Polysaccharide-23 11/25/2011   Pneumococcal-Unspecified 11/25/2011   Tdap 11/25/2011   Zoster Recombinat (Shingrix) 08/21/2019   Zoster, Live 10/18/2016    Past Medical History:  Diagnosis Date   Anal fissure    Anxiety    Arthritis    Atrial fibrillation (HCC)    Colon polyps    Depression    Diverticulosis    Dysrhythmia    GERD (gastroesophageal reflux disease)    H/O: hysterectomy    Heart murmur    as a child   History of bronchitis as a child    History of head injury    History of measles    History of mumps    Hyperlipidemia    Hypothyroidism    IBS (irritable bowel syndrome)     Menopause    OSA on CPAP    Osteopenia    Paroxysmal atrial fibrillation (HCC)    Restless leg syndrome    Thyroid disease    Venous insufficiency    right leg    Tobacco History: Social History   Tobacco Use  Smoking Status Former   Packs/day: 0.50   Years: 5.00   Total pack years: 2.50   Types: Cigarettes   Quit date: 11/21/1964   Years since quitting: 57.7   Passive exposure: Never  Smokeless Tobacco Never   Counseling given: Not Answered   Outpatient Medications Prior to Visit  Medication Sig Dispense Refill   acetaminophen (TYLENOL) 500 MG tablet Take 1,000 mg by mouth every 8 (eight) hours as needed for moderate pain.     apixaban (ELIQUIS) 5 MG TABS tablet TAKE 1 TABLET TWICE A DAY 180 tablet 2   Ascorbic Acid (VITAMIN C) 1000 MG tablet Take 1,000 mg by mouth in the morning and at bedtime.     b complex vitamins tablet Take 1 tablet by mouth daily.     budesonide (ENTOCORT EC) 3 MG 24 hr capsule Take 3 capsules (9 mg total) by mouth daily. 270 capsule 3   Cholecalciferol 50 MCG (2000 UT) TABS Take 2,000 Units by mouth daily.     DULoxetine (CYMBALTA) 60 MG capsule Take 60 mg by mouth daily.     famotidine (  PEPCID) 40 MG tablet Take 40 mg by mouth 2 (two) times daily.     gabapentin (NEURONTIN) 300 MG capsule Take 300 mg by mouth at bedtime as needed (pain).     MAGNESIUM CHLORIDE-CALCIUM PO Take 1 tablet by mouth in the morning and at bedtime.     melatonin 5 MG TABS Take 5 mg by mouth at bedtime.     Multiple Vitamins-Minerals (MULTIVITAMIN ADULT PO) Take 1 tablet by mouth daily.     pravastatin (PRAVACHOL) 40 MG tablet Take 40 mg by mouth daily.       thyroid (ARMOUR) 30 MG tablet Take 30 mg by mouth daily before breakfast.     traZODone (DESYREL) 50 MG tablet Take 50 mg by mouth at bedtime as needed for sleep.     No facility-administered medications prior to visit.      Review of Systems  Review of Systems   Physical Exam  There were no vitals taken  for this visit. Physical Exam   Lab Results:  CBC    Component Value Date/Time   WBC 8.1 07/20/2021 0337   RBC 3.96 07/20/2021 0337   HGB 11.9 (L) 07/20/2021 0337   HGB 13.7 07/16/2019 1035   HCT 36.5 07/20/2021 0337   HCT 42.8 07/16/2019 1035   PLT 178 07/20/2021 0337   PLT 202 07/16/2019 1035   MCV 92.2 07/20/2021 0337   MCV 93 07/16/2019 1035   MCH 30.1 07/20/2021 0337   MCHC 32.6 07/20/2021 0337   RDW 13.5 07/20/2021 0337   RDW 13.4 07/16/2019 1035   LYMPHSABS 1.1 07/16/2019 1035   EOSABS 0.2 07/16/2019 1035   BASOSABS 0.1 07/16/2019 1035    BMET    Component Value Date/Time   NA 140 07/20/2021 0337   NA 141 07/16/2019 1035   K 4.1 07/20/2021 0337   CL 109 07/20/2021 0337   CO2 24 07/20/2021 0337   GLUCOSE 132 (H) 07/20/2021 0337   BUN 12 07/20/2021 0337   BUN 18 07/16/2019 1035   CREATININE 0.77 07/20/2021 0337   CALCIUM 9.0 07/20/2021 0337   GFRNONAA >60 07/20/2021 0337   GFRAA 86 07/16/2019 1035    BNP No results found for: "BNP"  ProBNP No results found for: "PROBNP"  Imaging: No results found.   Assessment & Plan:   No problem-specific Assessment & Plan notes found for this encounter.     Martyn Ehrich, NP 08/07/2022

## 2022-08-08 ENCOUNTER — Ambulatory Visit (INDEPENDENT_AMBULATORY_CARE_PROVIDER_SITE_OTHER): Payer: Medicare Other | Admitting: Primary Care

## 2022-08-08 ENCOUNTER — Ambulatory Visit: Payer: Medicare Other | Attending: Physician Assistant | Admitting: Physician Assistant

## 2022-08-08 ENCOUNTER — Encounter: Payer: Self-pay | Admitting: Physician Assistant

## 2022-08-08 ENCOUNTER — Encounter: Payer: Self-pay | Admitting: Primary Care

## 2022-08-08 VITALS — BP 118/80 | HR 88 | Temp 98.4°F | Wt 178.6 lb

## 2022-08-08 VITALS — BP 124/80 | HR 75 | Ht 62.0 in | Wt 178.6 lb

## 2022-08-08 DIAGNOSIS — I480 Paroxysmal atrial fibrillation: Secondary | ICD-10-CM | POA: Insufficient documentation

## 2022-08-08 DIAGNOSIS — G4733 Obstructive sleep apnea (adult) (pediatric): Secondary | ICD-10-CM | POA: Diagnosis not present

## 2022-08-08 NOTE — Patient Instructions (Signed)
Medication Instructions:   Your physician recommends that you continue on your current medications as directed. Please refer to the Current Medication list given to you today.  *If you need a refill on your cardiac medications before your next appointment, please call your pharmacy*   Lab Work: [place]   If you have labs (blood work) drawn today and your tests are completely normal, you will receive your results only by: [place] (if you have MyChart) OR A paper copy in the mail If you have any lab test that is abnormal or we need to change your treatment, we will call you to review the results.   Testing/Procedures: NONE ORDERED  TODAY    Follow-Up: At [facility], you and your health needs are our priority.  As part of our continuing mission to provide you with exceptional heart care, we have created designated Provider Care Teams.  These Care Teams include your primary Cardiologist (physician) and Advanced Practice Providers (APPs -  Physician Assistants and Nurse Practitioners) who all work together to provide you with the care you need, when you need it.  We recommend signing up for the patient portal called "MyChart".  Sign up information is provided on this After Visit Summary.  MyChart is used to connect with patients for Virtual Visits (Telemedicine).  Patients are able to view lab/test results, encounter notes, upcoming appointments, etc.  Non-urgent messages can be sent to your provider as well.   To learn more about what you can do with MyChart, go to [email or web address].    Your next appointment:  AS SCHEDULE   The format for your next appointment:     Provider:   Lars Mage, MD    Other Instructions   Important Information About Sugar

## 2022-08-08 NOTE — Patient Instructions (Addendum)
Excellent compliance with CPAP No changes recommended today Continue to wear CPAP every night for minimum 4 to 6 hours or longer We will renew CPAP supplies   Follow-up 1 year Dr. Halford Chessman or sooner if needed  CPAP and BIPAP Information CPAP and BIPAP are methods that use air pressure to keep your airways open and to help you breathe well. CPAP and BIPAP use different amounts of pressure. Your health care provider will tell you whether CPAP or BIPAP would be more helpful for you. CPAP stands for "continuous positive airway pressure." With CPAP, the amount of pressure stays the same while you breathe in (inhale) and out (exhale). BIPAP stands for "bi-level positive airway pressure." With BIPAP, the amount of pressure will be higher when you inhale and lower when you exhale. This allows you to take larger breaths. CPAP or BIPAP may be used in the hospital, or your health care provider may want you to use it at home. You may need to have a sleep study before your health care provider can order a machine for you to use at home. What are the advantages? CPAP or BIPAP can be helpful if you have: Sleep apnea. Chronic obstructive pulmonary disease (COPD). Heart failure. Medical conditions that cause muscle weakness, including muscular dystrophy or amyotrophic lateral sclerosis (ALS). Other problems that cause breathing to be shallow, weak, abnormal, or difficult. CPAP and BIPAP are most commonly used for obstructive sleep apnea (OSA) to keep the airways from collapsing when the muscles relax during sleep. What are the risks? Generally, this is a safe treatment. However, problems may occur, including: Irritated skin or skin sores if the mask does not fit properly. Dry or stuffy nose or nosebleeds. Dry mouth. Feeling gassy or bloated. Sinus or lung infection if the equipment is not cleaned properly. When should CPAP or BIPAP be used? In most cases, the mask only needs to be worn during sleep.  Generally, the mask needs to be worn throughout the night and during any daytime naps. People with certain medical conditions may also need to wear the mask at other times, such as when they are awake. Follow instructions from your health care provider about when to use the machine. What happens during CPAP or BIPAP?  Both CPAP and BIPAP are provided by a small machine with a flexible plastic tube that attaches to a plastic mask that you wear. Air is blown through the mask into your nose or mouth. The amount of pressure that is used to blow the air can be adjusted on the machine. Your health care provider will set the pressure setting and help you find the best mask for you. Tips for using the mask Because the mask needs to be snug, some people feel trapped or closed-in (claustrophobic) when first using the mask. If you feel this way, you may need to get used to the mask. One way to do this is to hold the mask loosely over your nose or mouth and then gradually apply the mask more snugly. You can also gradually increase the amount of time that you use the mask. Masks are available in various types and sizes. If your mask does not fit well, talk with your health care provider about getting a different one. Some common types of masks include: Full face masks, which fit over the mouth and nose. Nasal masks, which fit over the nose. Nasal pillow or prong masks, which fit into the nostrils. If you are using a mask that fits over  your nose and you tend to breathe through your mouth, a chin strap may be applied to help keep your mouth closed. Use a skin barrier to protect your skin as told by your health care provider. Some CPAP and BIPAP machines have alarms that may sound if the mask comes off or develops a leak. If you have trouble with the mask, it is very important that you talk with your health care provider about finding a way to make the mask easier to tolerate. Do not stop using the mask. There could  be a negative impact on your health if you stop using the mask. Tips for using the machine Place your CPAP or BIPAP machine on a secure table or stand near an electrical outlet. Know where the on/off switch is on the machine. Follow instructions from your health care provider about how to set the pressure on your machine and when you should use it. Do not eat or drink while the CPAP or BIPAP machine is on. Food or fluids could get pushed into your lungs by the pressure of the CPAP or BIPAP. For home use, CPAP and BIPAP machines can be rented or purchased through home health care companies. Many different brands of machines are available. Renting a machine before purchasing may help you find out which particular machine works well for you. Your health insurance company may also decide which machine you may get. Keep the CPAP or BIPAP machine and attachments clean. Ask your health care provider for specific instructions. Check the humidifier if you have a dry stuffy nose or nosebleeds. Make sure it is working correctly. Follow these instructions at home: Take over-the-counter and prescription medicines only as told by your health care provider. Ask if you can take sinus medicine if your sinuses are blocked. Do not use any products that contain nicotine or tobacco. These products include cigarettes, chewing tobacco, and vaping devices, such as e-cigarettes. If you need help quitting, ask your health care provider. Keep all follow-up visits. This is important. Contact a health care provider if: You have redness or pressure sores on your head, face, mouth, or nose from the mask or head gear. You have trouble using the CPAP or BIPAP machine. You cannot tolerate wearing the CPAP or BIPAP mask. Someone tells you that you snore even when wearing your CPAP or BIPAP. Get help right away if: You have trouble breathing. You feel confused. Summary CPAP and BIPAP are methods that use air pressure to keep your  airways open and to help you breathe well. If you have trouble with the mask, it is very important that you talk with your health care provider about finding a way to make the mask easier to tolerate. Do not stop using the mask. There could be a negative impact to your health if you stop using the mask. Follow instructions from your health care provider about when to use the machine. This information is not intended to replace advice given to you by your health care provider. Make sure you discuss any questions you have with your health care provider. Document Revised: 06/16/2021 Document Reviewed: 10/16/2020 Elsevier Patient Education  [place].

## 2022-08-08 NOTE — Assessment & Plan Note (Addendum)
-   Well controlled; Patient is 100% compliant with CPAP use, usage 7 hours 34 minutes.  Auto setting  5- 15 cm H2O (7.4cm h20-95%) without residual apneas (AHI 0.3/hour). No changes.  Encourage patient continue to wear CPAP every night for minimum 4 to 6 hours or longer.  Encourage patient maintain normal BMI range.  DME order placed to renew CPAP supplies with [employer].  Follow-up in 1 year or sooner if needed.

## 2022-08-22 ENCOUNTER — Telehealth: Payer: Self-pay | Admitting: Primary Care

## 2022-08-22 NOTE — Telephone Encounter (Signed)
Patient states she has not heard anything regarding her CPAP supplies. Informed her that the order was placed 9/18 and sent to [facility]. Patient states she uses [facility]- are they the same now?  Please advise.

## 2022-08-22 NOTE — Telephone Encounter (Signed)
I spoke with Brad at [facility]. He's going to contact the team that has taken over the supplies order and figure out what's going on. He is also going to have them contact the patient so she's aware of where their at in the process. Nothing further needed at this time

## 2022-08-22 NOTE — Telephone Encounter (Signed)
[employer] bought [employer] out. I will call [employer] and see what is going on

## 2022-09-05 ENCOUNTER — Other Ambulatory Visit: Payer: Self-pay | Admitting: Physician Assistant

## 2022-09-05 DIAGNOSIS — I480 Paroxysmal atrial fibrillation: Secondary | ICD-10-CM

## 2022-09-05 NOTE — Telephone Encounter (Signed)
Eliquis '5mg'$  refill request received. Patient is 76 years old, weight-81kg, Crea-0.85 on 05/20/2022 via [facility] from [facility] [place], and last seen by Tommye Standard on 08/08/2022. Dose is appropriate based on dosing criteria. Will send in refill to requested pharmacy.

## 2023-01-23 NOTE — Progress Notes (Unsigned)
  Electrophysiology Office Follow up Visit Note:    Date:  01/23/2023   ID:  Frances Smith, DOB 06-Jul-1946, MRN [id number]  PCP:  Chesley Noon, MD  [facility] Cardiologist:  None  [facility] Electrophysiologist:  Vickie Epley, MD    Interval History:    Frances Smith is a 77 y.o. female who presents for a follow up visit.  Last saw [facility] 08/08/2022.   AFib Hx Diagnosed as far back as 1999 AFIB ablation 1999 in [place] PVI and CTI ablation 07/19/2019, Dr. Rayann Heman AAD Propafenone goes as far back as 2010 >> stopped post ablation Nov 2020     Past medical, surgical, social and family history were reviewed.  ROS:   Please see the history of present illness.    All other systems reviewed and are negative.  EKGs/Labs/Other Studies Reviewed:    The following studies were reviewed today:    Physical Exam:    VS:  There were no vitals taken for this visit.    Wt Readings from Last 3 Encounters:  08/08/22 178 lb 9.6 oz (81 kg)  08/08/22 178 lb 9.6 oz (81 kg)  01/10/22 195 lb 6.4 oz (88.6 kg)     GEN: *** Well nourished, well developed in no acute distress CARDIAC: ***RRR, no murmurs, rubs, gallops RESPIRATORY:  Clear to auscultation without rales, wheezing or rhonchi       ASSESSMENT:    No diagnosis found. PLAN:    In order of problems listed above:  #pAF On eliquis. Low burden.     Follow up 22yrwith APP.       Signed, CLars Mage MD, F[facility] St. [facility]3/02/2023 10:00 PM    [facility]

## 2023-01-24 ENCOUNTER — Encounter: Payer: Self-pay | Admitting: Cardiology

## 2023-01-24 ENCOUNTER — Ambulatory Visit: Payer: Medicare Other | Attending: Cardiology | Admitting: Cardiology

## 2023-01-24 VITALS — BP 138/80 | HR 88 | Ht 62.0 in | Wt 182.0 lb

## 2023-01-24 DIAGNOSIS — I480 Paroxysmal atrial fibrillation: Secondary | ICD-10-CM | POA: Diagnosis present

## 2023-01-24 NOTE — Patient Instructions (Signed)
Medication Instructions:  Your physician recommends that you continue on your current medications as directed. Please refer to the Current Medication list given to you today.  *If you need a refill on your cardiac medications before your next appointment, please call your pharmacy*  Follow-Up: At [facility], you and your health needs are our priority.  As part of our continuing mission to provide you with exceptional heart care, we have created designated Provider Care Teams.  These Care Teams include your primary Cardiologist (physician) and Advanced Practice Providers (APPs -  Physician Assistants and Nurse Practitioners) who all work together to provide you with the care you need, when you need it.  Your next appointment:   1 year(s)  Provider:   You will see one of the following Advanced Practice Providers on your designated Care Team:   Tommye Standard, [place]" [place], [place], NP

## 2023-01-24 NOTE — Progress Notes (Signed)
  Electrophysiology Office Follow up Visit Note:    Date:  01/24/2023   ID:  Frances Smith, DOB 1946/02/11, MRN [id number]  PCP:  Chesley Noon, MD  [facility] Cardiologist:  None  [facility] Electrophysiologist:  Vickie Epley, MD    Interval History:    Frances Smith is a 77 y.o. female who presents for a follow up visit.  Last saw [facility] 08/08/2022.   AFib Hx Diagnosed as far back as 1999 AFIB ablation 1999 in [place] PVI and CTI ablation 07/19/2019, Dr. Rayann Heman AAD Propafenone goes as far back as 2010 >> stopped post ablation Nov 2020  Today, she reports having palpitations every once in a while. Otherwise she denies any recurring arrhythmias. She monitors her heart rhythm with her smart watch.  She is compliant with her Eliquis 5 mg BID. No bleeding issues.  She presents records of her blood work from 11/2022 which is personally reviewed and reveals normal kidney function and hemoglobin levels.  Also she is on Contrave weight loss medication.   She denies any palpitations, chest pain, shortness of breath, or peripheral edema. No lightheadedness, headaches, syncope, orthopnea, or PND.     Past medical, surgical, social and family history were reviewed.  ROS:   Please see the history of present illness.    All other systems reviewed and are negative.  EKGs/Labs/Other Studies Reviewed:    The following studies were reviewed today:    Physical Exam:    VS:  BP 138/80   Pulse 88   Ht '5\' 2"'$  (1.575 m)   Wt 182 lb (82.6 kg)   SpO2 99%   BMI 33.29 kg/m     Wt Readings from Last 3 Encounters:  01/24/23 182 lb (82.6 kg)  08/08/22 178 lb 9.6 oz (81 kg)  08/08/22 178 lb 9.6 oz (81 kg)     GEN: Well nourished, well developed in no acute distress.  Obese CARDIAC: RRR, no murmurs, rubs, gallops RESPIRATORY:  Clear to auscultation without rales, wheezing or rhonchi       ASSESSMENT:    1. Paroxysmal atrial fibrillation (HCC)    PLAN:    In  order of problems listed above:  #pAF On eliquis. Low burden.   Follow up 65yrwith APP.   I,Mathew Stumpf,acting as a sEducation administratorfor CVickie Epley MD.,have documented all relevant documentation on the behalf of CVickie Epley MD,as directed by  CVickie Epley MD while in the presence of CVickie Epley MD.  I, CVickie Epley MD, have reviewed all documentation for this visit. The documentation on 01/24/23 for the exam, diagnosis, procedures, and orders are all accurate and complete.   Signed, CLars Mage MD, [facility]3/03/2023 11:03 AM    Electrophysiology [facility]

## 2023-02-28 ENCOUNTER — Other Ambulatory Visit: Payer: Self-pay | Admitting: Family Medicine

## 2023-02-28 DIAGNOSIS — Z1231 Encounter for screening mammogram for malignant neoplasm of breast: Secondary | ICD-10-CM

## 2023-04-10 ENCOUNTER — Ambulatory Visit
Admission: RE | Admit: 2023-04-10 | Discharge: 2023-04-10 | Disposition: A | Discharge status: Home / Self Care | Payer: Medicare Other | Source: Ambulatory Visit | Attending: Family Medicine | Admitting: Family Medicine

## 2023-04-10 DIAGNOSIS — Z1231 Encounter for screening mammogram for malignant neoplasm of breast: Secondary | ICD-10-CM

## 2023-06-02 ENCOUNTER — Other Ambulatory Visit: Payer: Self-pay | Admitting: Physician Assistant

## 2023-06-02 DIAGNOSIS — I480 Paroxysmal atrial fibrillation: Secondary | ICD-10-CM

## 2023-06-02 NOTE — Telephone Encounter (Signed)
Prescription refill request for Eliquis received. Indication:afib Last office visit:3/24 Scr:0.85  7/23 Age: 77 Weight:82.6  kg  Prescription refilled

## 2024-01-18 NOTE — Progress Notes (Unsigned)
 Cardiology Office Note Date:  01/18/2024  Patient ID:  Frances Smith, Frances Smith 02/17/1946, MRN [id number] PCP:  Eartha Inch, MD  Cardiologist/Electrophysiologist:  Dr. Lalla Brothers    Chief Complaint:   *** annual visit   History of Present Illness: Frances Smith is a 78 y.o. female with history of PAFlutter/ablated, PAfib > ablated 1999 and 2020 , HLD, hypothyroidism, OSA w/CPAP   She saw Dr. Johney Frame in Aug 2022, doing well, cleared for an upcoming knee surgery, no changes were made, recommended annual visit  I have seen her a couple times Last on 08/08/22 She is doing GREAT! Swimming, using the elliptical for exercise, great exertional capacity and has lost  No CP, SOB No syncope or syncope. Rare fleeting palpitations only, no Afib No bleeding or signs of bleeding Sees her PMD 2x a year with labs there Planned to see Dr. Simmie Davies Dr. Lalla Brothers 01/24/23, palpitations once in a while, no symptoms of AFib, recent labs OK, on Contrave for weight management No changes made Annual visit recommended  TODAY  *** eliquis, dose, labs, bleeding *** symptoms, Afib, otherwise   AFib Hx Diagnosed as far back as 1999 AFIB ablation 1999 in [place] P[place] and CTI ablation 07/19/2019, Dr. Johney Frame [facility] Propafenone goes as far back as 2010 >> stopped post ablation Nov 2020  Past Medical History:  Diagnosis Date   Anal fissure    Anxiety    Arthritis    Atrial fibrillation (HCC)    Colon polyps    Depression    Diverticulosis    Dysrhythmia    GERD (gastroesophageal reflux disease)    H/O: hysterectomy    Heart murmur    as a child   History of bronchitis as a child    History of head injury    History of measles    History of mumps    Hyperlipidemia    Hypothyroidism    IBS (irritable bowel syndrome)    Menopause    OSA on CPAP    Osteopenia    Paroxysmal atrial fibrillation (HCC)    Restless leg syndrome    Thyroid disease    Venous insufficiency    right leg     Past Surgical History:  Procedure Laterality Date   ATRIAL FIBRILLATION ABLATION N/A 07/19/2019   Procedure: ATRIAL FIBRILLATION ABLATION;  Surgeon: Hillis Range, MD;  Location: [facility];  Service: Cardiovascular;  Laterality: N/A;   BREAST CYST ASPIRATION     multiple but not sure when   BREAST CYST INCISION AND DRAINAGE Left 1985   BREAST SURGERY     aspiration of cyst / left    COLONOSCOPY  2010   ENDOMETRIAL ABLATION     PARTIAL KNEE ARTHROPLASTY Left 08/10/2016   Procedure: LEFT KNEE UNICOMPARTMENTAL ARTHROPLASTY;  Surgeon: Ollen Gross, MD;  Location: [facility];  Service: Orthopedics;  Laterality: Left;   TONSILLECTOMY AND ADENOIDECTOMY  1949   TOTAL KNEE ARTHROPLASTY Right 07/19/2021   Procedure: TOTAL KNEE ARTHROPLASTY;  Surgeon: Ollen Gross, MD;  Location: [facility];  Service: Orthopedics;  Laterality: Right;   TREATMENT FISTULA ANAL  1988   VAGINAL HYSTERECTOMY  1984    Current Outpatient Medications  Medication Sig Dispense Refill   acetaminophen (TYLENOL) 500 MG tablet Take 1,000 mg by mouth every 8 (eight) hours as needed for moderate pain.     ALPRAZolam (XANAX) 0.25 MG tablet Take 0.25 mg by mouth as needed for anxiety.     Ascorbic  Acid (VITAMIN C) 1000 MG tablet Take 1,000 mg by mouth in the morning and at bedtime.     b complex vitamins tablet Take 1 tablet by mouth daily.     Cholecalciferol 50 MCG (2000 UT) TABS Take 2,000 Units by mouth daily.     CONTRAVE 8-90 MG TB12 TAKE AS INSTRUCTED BY YOUR PRESCRIBER     DULoxetine (CYMBALTA) 60 MG capsule Take 60 mg by mouth daily.     ELIQUIS 5 MG TABS tablet TAKE 1 TABLET TWICE A DAY 180 tablet 3   famotidine (PEPCID) 40 MG tablet Take 40 mg by mouth 2 (two) times daily.     MAGNESIUM CHLORIDE-CALCIUM PO Take 1 tablet by mouth in the morning and at bedtime.     melatonin 5 MG TABS Take 5 mg by mouth at bedtime.     Multiple Vitamins-Minerals (MULTIVITAMIN ADULT PO) Take 1 tablet by mouth daily.      pravastatin (PRAVACHOL) 40 MG tablet Take 40 mg by mouth daily.       thyroid (ARMOUR) 30 MG tablet Take 30 mg by mouth daily before breakfast.     traZODone (DESYREL) 50 MG tablet Take 50 mg by mouth at bedtime as needed for sleep.     No current facility-administered medications for this visit.    Allergies:   Codeine   Social History:  The patient  reports that she quit smoking about 59 years ago. Her smoking use included cigarettes. She started smoking about 64 years ago. She has a 2.5 pack-year smoking history. She has never been exposed to tobacco smoke. She has never used smokeless tobacco. She reports that she does not drink alcohol and does not use drugs.   Family History:  The patient's family history includes Allergies in her mother; Anxiety disorder in her brother, daughter, and son; Asthma in her mother; Atrial fibrillation in her father; Breast cancer in her maternal aunt, maternal grandmother, and mother; Colon cancer (age of onset: 32) in her cousin; Colon cancer (age of onset: 35) in her maternal aunt; Depression in her daughter and son; Heart failure (age of onset: 74) in her father; Thyroid disease in her brother.  ROS:  Please see the history of present illness.   All other systems are reviewed and otherwise negative.   PHYSICAL EXAM:  VS:  There were no vitals taken for this visit. BMI: There is no height or weight on file to calculate BMI. Well nourished, well developed, in no acute distress  HEENT: normocephalic, atraumatic  Neck: no JVD, carotid bruits or masses Cardiac: *** RRR, no significant murmurs, no rubs, or gallops Lungs: *** CTA b/l, no wheezing, rhonchi or rales  Abd: soft, nontender MS: no deformity or atrophy Ext: *** no edema Skin: warm and dry, no rash Neuro:  No gross deficits appreciated Psych: euthymic mood, full affect    EKG:  done today and reviewed by myself ***   07/19/2019: EPS/ablation, Dr. Johney Frame CONCLUSIONS: 1. Sinus rhythm upon  presentation.   2. Intracardiac echo reveals a moderate sized left atrium with four separate pulmonary veins without evidence of pulmonary vein stenosis. 3. Return of electrical conduction within all four PVs at baseline. 4, Successful electrical re-isolation and anatomical encircling of all four pulmonary veins with radiofrequency current.   A WACA approach was used 4. Cavo-tricuspid isthmus ablation was performed with complete bidirectional isthmus block achieved.  5. No inducible arrhythmias following ablation both on and off of Isuprel 6. No early apparent complications.  10/02/2018; TTE Study Conclusions  - Left ventricle: The cavity size was mildly reduced. Wall    thickness was increased in a pattern of mild LVH. Systolic    function was normal. The estimated ejection fraction was in the    range of 60% to 65%. Wall motion was normal; there were no    regional wall motion abnormalities. Left ventricular diastolic    function parameters were normal for the patient&'s age.  - Aortic valve: There was no regurgitation.  - Mitral valve: There was trivial regurgitation.  - Left atrium: The atrium was mildly dilated.  - Right ventricle: Systolic function was normal.  - Atrial septum: No defect or patent foramen ovale was identified.  - Tricuspid valve: There was trivial regurgitation.  - Pulmonic valve: There was no significant regurgitation.   Impressions:  - Normal LV EF, no significant diastolic dysfunction. No    significant valvular abnormalities.    07/28/16: Steffanie Dunn Study Highlights  Nuclear stress EF: 71%. There was no ST segment deviation noted during stress. The study is normal. This is a low risk study. The left ventricular ejection fraction is hyperdynamic (>65%).       09/21/11: stress myoview Overall Impression:  Normal stress nuclear study EF: 77% NL LV Function; NL Wall Motion   Recent Labs: No results found for requested labs within last 365  days.  No results found for requested labs within last 365 days.   CrCl cannot be calculated (Patient's most recent lab result is older than the maximum 21 days allowed.).   Wt Readings from Last 3 Encounters:  01/24/23 182 lb (82.6 kg)  08/08/22 178 lb 9.6 oz (81 kg)  08/08/22 178 lb 9.6 oz (81 kg)     Other studies reviewed: Additional studies/records reviewed today include: summarized above  ASSESSMENT AND PLAN:   1. PAFib     CHA2DS2Vasc is  *** 3, on Eliquis, *** appropriately dosed     *** Low/no burden by symptoms   Disposition:  *** see Dr. Lalla Brothers as scheduled, sooner if needed    Current medicines are reviewed at length with the patient today.  The patient did not have any concerns regarding medicines.  Judith Blonder, PA-C 01/18/2024 1:14 PM      [address] [place] [address] [phone] (office)  [phone] (fax)

## 2024-01-19 ENCOUNTER — Encounter: Payer: Self-pay | Admitting: Physician Assistant

## 2024-01-19 ENCOUNTER — Ambulatory Visit: Payer: Medicare Other | Attending: Physician Assistant | Admitting: Physician Assistant

## 2024-01-19 VITALS — BP 120/88 | HR 67 | Ht 62.0 in | Wt 155.4 lb

## 2024-01-19 DIAGNOSIS — D6869 Other thrombophilia: Secondary | ICD-10-CM | POA: Diagnosis present

## 2024-01-19 DIAGNOSIS — I480 Paroxysmal atrial fibrillation: Secondary | ICD-10-CM | POA: Diagnosis present

## 2024-01-19 NOTE — Patient Instructions (Signed)
 Medication Instructions:   Your physician recommends that you continue on your current medications as directed. Please refer to the Current Medication list given to you today.   *If you need a refill on your cardiac medications before your next appointment, please call your pharmacy*   Lab Work: NONE ORDERED  TODAY     If you have labs (blood work) drawn today and your tests are completely normal, you will receive your results only by: MyChart Message (if you have MyChart) OR A paper copy in the mail If you have any lab test that is abnormal or we need to change your treatment, we will call you to review the results.   Testing/Procedures:  NONE ORDERED  TODAY     Follow-Up: At [facility], you and your health needs are our priority.  As part of our continuing mission to provide you with exceptional heart care, we have created designated Provider Care Teams.  These Care Teams include your primary Cardiologist (physician) and Advanced Practice Providers (APPs -  Physician Assistants and Nurse Practitioners) who all work together to provide you with the care you need, when you need it.  We recommend signing up for the patient portal called "MyChart".  Sign up information is provided on this After Visit Summary.  MyChart is used to connect with patients for Virtual Visits (Telemedicine).  Patients are able to view lab/test results, encounter notes, upcoming appointments, etc.  Non-urgent messages can be sent to your provider as well.   To learn more about what you can do with MyChart, go to [email or web address].    Your next appointment:    1 year(s)   Provider:    Steffanie Dunn, MD or Francis Dowse, PA-C   Other Instructions    1st Floor: - Lobby - Registration  - Pharmacy  - Lab - Cafe  2nd Floor: - PV Lab - Diagnostic Testing (echo, CT, nuclear med)  3rd Floor: - Vacant  4th Floor: - TCTS (cardiothoracic surgery) - AFib Clinic - Structural Heart  Clinic - Vascular Surgery  - Vascular Ultrasound  5th Floor: - [facility] (general and EP) - Clinical Pharmacy for coumadin, hypertension, lipid, weight-loss medications, and med management appointments    Valet parking services will be available as well.

## 2024-02-01 ENCOUNTER — Ambulatory Visit (INDEPENDENT_AMBULATORY_CARE_PROVIDER_SITE_OTHER): Payer: Medicare Other | Admitting: Primary Care

## 2024-02-01 VITALS — BP 108/70 | HR 71 | Ht 61.0 in | Wt 152.2 lb

## 2024-02-01 DIAGNOSIS — G4733 Obstructive sleep apnea (adult) (pediatric): Secondary | ICD-10-CM | POA: Diagnosis not present

## 2024-02-01 NOTE — Progress Notes (Signed)
 @Patient  ID: Frances Smith, female    DOB: 1946-05-14, 78 y.o.   MRN: [id number]  Chief Complaint  Patient presents with   Follow-up    Cpap f/u    Referring provider: Eartha Inch, MD  HPI: 78 year old female, former smoker. PMH significant for OSA, afib, GERD, hypothyroidism, hyperlipidemia, GAD. Patient of Dr. Craige Cotta. Maintained on auto CPAP.   Previous [facility] pulmonary encounter:  08/08/2022 Patient presents today for OSA follow-up. She is doing well. She is no longer snoring. She is 100% compliant with use. Took her some time to get used to wearing CPAP. She is using small size nasal cradle mask. No significant daytime sleepiness. Needing CPAP supplies renewed, DME company is [employer].   Airview download 07/09/22-08/07/22 Usage 30/30 days; 25 days (83%) > 4 hours Usage 7 hours 34 mins Pressure 5-15cm h20 (7.4cm h20-95%) Airleaks 16.3L/min AHI 0.3  02/01/2024- Interim hx  Discussed the use of AI scribe software for clinical note transcription with the patient, who gave verbal consent to proceed.  History of Present Illness   The patient, with obstructive sleep apnea, presents for follow-up of sleep apnea management. She is accompanied by her husband. She is a former patient of [employer].  She has a history of mild sleep apnea diagnosed in April 2019, with a sleep study showing an average of 10.6 apneic or hypopneic events per hour. At that time, her weight was 197 pounds. She has been using an auto CPAP with settings of 5 to 15 cm H2O and has 93% compliance over the last 30 days, averaging 8.5 hours of use per night. Her residual apnea score is 0.2. Since the last visit in September 2023, her husband has noted a reduction in snoring on nights when she does not use the CPAP.  She has experienced significant weight loss, approximately 45 pounds, and currently maintains a stable weight. This weight loss may have impacted the severity of her sleep apnea.  She has been on a  medication regimen of Zepound, currently on a maintenance dose of 15 mg, which she started in November. The medication has been effective, contributing to her weight loss, although she experiences some challenges with maintaining adequate nutrition due to reduced appetite. Her insurance covers the medication, and she pays $38 for a three-month supply.      Airview download 01/01/2024 - 01/30/2024 Usage days 28/30 days (93%) greater than 4 hours Average usage days used 8 hours 35 minutes Pressure 5 to 15 cm H2O (8.9 cm H2O-95%) Air leaks 18.6 L/min (95%) AHI 0.2  Sleep testing:  PSG 08/01/12 >> AHI 0 HST 02/22/18 >> AHI 10.6, SaO2 low 86%   Allergies  Allergen Reactions   Codeine Itching, Rash and Hives    Immunization History  Administered Date(s) Administered   Hep A / Hep B 02/12/2015   Hepatitis B, PED/ADOLESCENT 03/16/2015, 09/15/2015   Influenza Split 10/17/2011, 08/03/2017   Influenza, High Dose Seasonal PF 10/18/2016, 09/19/2017, 08/29/2018, 08/21/2019   Influenza, Seasonal, Injecte, Preservative Fre 02/12/2015, 09/21/2015   Influenza,inj,Quad PF,6+ Mos 02/12/2015, 09/21/2015, 09/19/2017, 08/22/2019   Influenza,inj,quad, With Preservative 09/19/2017   Influenza,trivalent, recombinat, inj, PF 10/17/2011, 08/24/2012, 08/03/2017   Influenza-Unspecified 10/03/2023   PFIZER Comirnaty(Gray Top)Covid-19 Tri-Sucrose Vaccine 10/03/2023   PFIZER(Purple Top)SARS-COV-2 Vaccination 12/05/2019, 12/24/2019   Pfizer(Comirnaty)Fall Seasonal Vaccine 12 years and older 09/30/2022   Pneumococcal Conjugate-13 10/14/2015   Pneumococcal Polysaccharide-23 11/25/2011   Pneumococcal-Unspecified 11/25/2011   Tdap 11/25/2011   Zoster Recombinant(Shingrix) 08/21/2019   Zoster,  Live 10/18/2016    Past Medical History:  Diagnosis Date   Anal fissure    Anxiety    Arthritis    Atrial fibrillation (HCC)    Colon polyps    Depression    Diverticulosis    Dysrhythmia    GERD (gastroesophageal  reflux disease)    H/O: hysterectomy    Heart murmur    as a child   History of bronchitis as a child    History of head injury    History of measles    History of mumps    Hyperlipidemia    Hypothyroidism    IBS (irritable bowel syndrome)    Menopause    OSA on CPAP    Osteopenia    Paroxysmal atrial fibrillation (HCC)    Restless leg syndrome    Thyroid disease    Venous insufficiency    right leg    Tobacco History: Social History   Tobacco Use  Smoking Status Former   Current packs/day: 0.00   Average packs/day: 0.5 packs/day for 5.0 years (2.5 ttl pk-yrs)   Types: Cigarettes   Start date: 11/22/1959   Quit date: 11/21/1964   Years since quitting: 59.2   Passive exposure: Never  Smokeless Tobacco Never   Counseling given: Not Answered   Outpatient Medications Prior to Visit  Medication Sig Dispense Refill   acetaminophen (TYLENOL) 500 MG tablet Take 1,000 mg by mouth every 8 (eight) hours as needed for moderate pain.     ALPRAZolam (XANAX) 0.25 MG tablet Take 0.25 mg by mouth as needed for anxiety.     Ascorbic Acid (VITAMIN C) 1000 MG tablet Take 1,000 mg by mouth in the morning and at bedtime.     b complex vitamins tablet Take 1 tablet by mouth daily.     Cholecalciferol 50 MCG (2000 UT) TABS Take 2,000 Units by mouth daily.     DULoxetine (CYMBALTA) 60 MG capsule Take 60 mg by mouth daily.     ELIQUIS 5 MG TABS tablet TAKE 1 TABLET TWICE A DAY 180 tablet 3   famotidine (PEPCID) 40 MG tablet Take 40 mg by mouth 2 (two) times daily.     MAGNESIUM CHLORIDE-CALCIUM PO Take 1 tablet by mouth in the morning and at bedtime.     melatonin 5 MG TABS Take 5 mg by mouth at bedtime.     Multiple Vitamins-Minerals (MULTIVITAMIN ADULT PO) Take 1 tablet by mouth daily.     pravastatin (PRAVACHOL) 40 MG tablet Take 40 mg by mouth daily.       thyroid (ARMOUR) 30 MG tablet Take 30 mg by mouth daily before breakfast.     traZODone (DESYREL) 50 MG tablet Take 50 mg by mouth at  bedtime as needed for sleep.     ZEPBOUND 15 MG/0.5ML Pen Inject 15 mg into the skin once a week.     No facility-administered medications prior to visit.      Review of Systems  Review of Systems   Physical Exam  BP 108/70 (BP Location: Left Arm, Patient Position: Sitting, Cuff Size: Normal)   Pulse 71   Ht 5\' 1"  (1.549 m)   Wt 152 lb 3.2 oz (69 kg)   SpO2 98%   BMI 28.76 kg/m  Physical Exam   Lab Results:  CBC    Component Value Date/Time   WBC 8.1 07/20/2021 0337   RBC 3.96 07/20/2021 0337   HGB 11.9 (L) 07/20/2021 0337   HGB 13.7 07/16/2019  1035   HCT 36.5 07/20/2021 0337   HCT 42.8 07/16/2019 1035   PLT 178 07/20/2021 0337   PLT 202 07/16/2019 1035   MCV 92.2 07/20/2021 0337   MCV 93 07/16/2019 1035   MCH 30.1 07/20/2021 0337   MCHC 32.6 07/20/2021 0337   RDW 13.5 07/20/2021 0337   RDW 13.4 07/16/2019 1035   LYMPHSABS 1.1 07/16/2019 1035   EOSABS 0.2 07/16/2019 1035   BASOSABS 0.1 07/16/2019 1035    BMET    Component Value Date/Time   NA 140 07/20/2021 0337   NA 141 07/16/2019 1035   K 4.1 07/20/2021 0337   CL 109 07/20/2021 0337   CO2 24 07/20/2021 0337   GLUCOSE 132 (H) 07/20/2021 0337   BUN 12 07/20/2021 0337   BUN 18 07/16/2019 1035   CREATININE 0.77 07/20/2021 0337   CALCIUM 9.0 07/20/2021 0337   GFRNONAA >60 07/20/2021 0337   GFRAA 86 07/16/2019 1035    BNP No results found for: "BNP"  ProBNP No results found for: "PROBNP"  Imaging: No results found.   Assessment & Plan:   1. OSA (obstructive sleep apnea) (Primary) - Home sleep test; Future     Obstructive Sleep Apnea (OSA) Mild OSA with AHI of 10.6. Significant weight loss. Patient remains compliant with CPAP use. Potential to discontinue CPAP therapy. Repeat sleep study needed to reassess OSA severity. - Order home sleep study through [facility]. - Review sleep study results in 2-3 weeks. - Send MyChart message two weeks post-study to prompt result review. -  Determine need for continued CPAP therapy based on results.  Obesity Significant weight loss achieved with Zepbound. Current medication regimen effective and affordable. Discussed medication's effectiveness and potential side effects. - Continue current medication regimen for weight management.       Glenford Bayley, NP 02/01/2024

## 2024-02-01 NOTE — Patient Instructions (Signed)
 -  OBSTRUCTIVE SLEEP APNEA (OSA): Obstructive Sleep Apnea is a condition where the airway becomes blocked during sleep, causing breathing to stop and start repeatedly. Given your significant weight loss and high compliance with CPAP therapy, we will conduct a home sleep study to reassess the severity of your sleep apnea. Based on the results, we will determine if you can discontinue CPAP therapy. Please complete the home sleep study through [facility], and we will review the results in 2-3 weeks. I will also send you a message via MyChart two weeks after the study to prompt a review of the results.  -OBESITY: Obesity is a condition characterized by excessive body fat. You have achieved significant weight loss and your current medication regimen has been effective and affordable. Continue with your current medication regimen for weight management.  Follow-up Please reach out to [place] by MyChart approximately 2 to 3 weeks after completing home sleep study review results and discuss potential discontinuation of CPAP

## 2024-02-02 LAB — COLOGUARD: COLOGUARD: NEGATIVE

## 2024-02-02 LAB — EXTERNAL GENERIC LAB PROCEDURE: COLOGUARD: NEGATIVE

## 2024-02-12 ENCOUNTER — Ambulatory Visit

## 2024-02-12 DIAGNOSIS — G4733 Obstructive sleep apnea (adult) (pediatric): Secondary | ICD-10-CM

## 2024-02-20 NOTE — Telephone Encounter (Signed)
 Spoke with patient, currently in AF according to her apple watch with rates in the 70's. Patients only complaint is fatigue but wanted to check with Dr Lalla Brothers (or Luster Landsberg) about if she should be started back on her propafenone, she used this prior to ablation with Dr Johney Frame in 06/2019.  Confirmed she is taking her eliquis twice daily and stressed the importance of utilizing her CPAP machine on a nightly basis.

## 2024-02-21 MED ORDER — METOPROLOL SUCCINATE ER 25 MG PO TB24: 25.0000 mg | ORAL_TABLET | Freq: Every day | ORAL | 3 refills | Status: DC

## 2024-02-23 NOTE — Telephone Encounter (Signed)
 Spoke with patient, aware that Toprol 25 mg daily has been sent in to her pharmacy already and her appointment at the AF clinic is on 03/01/24. No needs at this time

## 2024-02-28 DIAGNOSIS — G4733 Obstructive sleep apnea (adult) (pediatric): Secondary | ICD-10-CM | POA: Diagnosis not present

## 2024-02-28 DIAGNOSIS — R0683 Snoring: Secondary | ICD-10-CM | POA: Diagnosis not present

## 2024-03-01 ENCOUNTER — Ambulatory Visit (HOSPITAL_COMMUNITY)
Admission: RE | Admit: 2024-03-01 | Discharge: 2024-03-01 | Disposition: A | Discharge status: Home / Self Care | Source: Ambulatory Visit | Attending: Physician Assistant | Admitting: Physician Assistant

## 2024-03-01 ENCOUNTER — Encounter (HOSPITAL_COMMUNITY): Payer: Self-pay | Admitting: Physician Assistant

## 2024-03-01 VITALS — BP 134/88 | HR 71 | Ht 61.0 in | Wt 150.6 lb

## 2024-03-01 DIAGNOSIS — G4733 Obstructive sleep apnea (adult) (pediatric): Secondary | ICD-10-CM | POA: Diagnosis not present

## 2024-03-01 DIAGNOSIS — E039 Hypothyroidism, unspecified: Secondary | ICD-10-CM | POA: Insufficient documentation

## 2024-03-01 DIAGNOSIS — Z79899 Other long term (current) drug therapy: Secondary | ICD-10-CM | POA: Diagnosis not present

## 2024-03-01 DIAGNOSIS — I480 Paroxysmal atrial fibrillation: Secondary | ICD-10-CM | POA: Insufficient documentation

## 2024-03-01 DIAGNOSIS — I4892 Unspecified atrial flutter: Secondary | ICD-10-CM | POA: Insufficient documentation

## 2024-03-01 DIAGNOSIS — D6869 Other thrombophilia: Secondary | ICD-10-CM | POA: Diagnosis not present

## 2024-03-01 DIAGNOSIS — Z7901 Long term (current) use of anticoagulants: Secondary | ICD-10-CM | POA: Diagnosis not present

## 2024-03-01 DIAGNOSIS — E785 Hyperlipidemia, unspecified: Secondary | ICD-10-CM | POA: Diagnosis not present

## 2024-03-01 MED ORDER — METOPROLOL SUCCINATE ER 25 MG PO TB24: 25.0000 mg | ORAL_TABLET | Freq: Every day | ORAL | Status: DC | PRN

## 2024-03-01 NOTE — Progress Notes (Signed)
 Primary Care Physician: Medicine, [facility] Primary Cardiologist: None Electrophysiologist: Lanier Prude, MD  Referring Physician: Francis Dowse PA   Frances Smith is a 78 y.o. female with a history of HLD, hypothyroidism, OSA, atrial flutter, atrial fibrillation who presents for follow up in the [facility].  The patient was initially diagnosed with atrial fibrillation remotely and had an ablation in 1999 in [place] and repeat afib and flutter ablation with Dr Johney Frame in 2020. Her propafenone was discontinued after her last ablation. Patient is on Eliquis for stroke prevention.   Patient presents today for follow up for atrial fibrillation. She reports that she had an increase in her afib burden with brief but frequent episodes about two weeks ago. She admits she was not sleeping well or using her CPAP because she was helping her husband who had recent surgery. She was prescribed metoprolol but has not starting taking it. She states that her afib has "settled down" lately.   Today, she denies symptoms of chest pain, shortness of breath, orthopnea, PND, lower extremity edema, dizziness, presyncope, syncope, bleeding, or neurologic sequela. The patient is tolerating medications without difficulties and is otherwise without complaint today.    Atrial Fibrillation Risk Factors:  she does have symptoms or diagnosis of sleep apnea. she does not have a history of rheumatic fever.   Atrial Fibrillation Management history:  Previous antiarrhythmic drugs: propafenone  Previous cardioversions: none Previous ablations: 1999 in [place], PVI and CTI 07/19/19 Dr Johney Frame Anticoagulation history: Eliquis  ROS- All systems are reviewed and negative except as per the HPI above.  Past Medical History:  Diagnosis Date   Anal fissure    Anxiety    Arthritis    Atrial fibrillation (HCC)    Colon polyps    Depression    Diverticulosis     Dysrhythmia    GERD (gastroesophageal reflux disease)    H/O: hysterectomy    Heart murmur    as a child   History of bronchitis as a child    History of head injury    History of measles    History of mumps    Hyperlipidemia    Hypothyroidism    IBS (irritable bowel syndrome)    Menopause    OSA on CPAP    Osteopenia    Paroxysmal atrial fibrillation (HCC)    Restless leg syndrome    Thyroid disease    Venous insufficiency    right leg    Current Outpatient Medications  Medication Sig Dispense Refill   acetaminophen (TYLENOL) 500 MG tablet Take 1,000 mg by mouth every 8 (eight) hours as needed for moderate pain.     ALPRAZolam (XANAX) 0.25 MG tablet Take 0.25 mg by mouth as needed for anxiety.     Ascorbic Acid (VITAMIN C) 1000 MG tablet Take 1,000 mg by mouth in the morning and at bedtime.     b complex vitamins tablet Take 1 tablet by mouth daily.     Cholecalciferol 50 MCG (2000 UT) TABS Take 2,000 Units by mouth daily.     DULoxetine (CYMBALTA) 60 MG capsule Take 60 mg by mouth daily.     ELIQUIS 5 MG TABS tablet TAKE 1 TABLET TWICE A DAY 180 tablet 3   famotidine (PEPCID) 40 MG tablet Take 40 mg by mouth 2 (two) times daily.     MAGNESIUM CHLORIDE-CALCIUM PO Take 1 tablet by mouth in the morning and at bedtime.  melatonin 5 MG TABS Take 5 mg by mouth at bedtime.     Multiple Vitamins-Minerals (MULTIVITAMIN ADULT PO) Take 1 tablet by mouth daily.     pravastatin (PRAVACHOL) 40 MG tablet Take 40 mg by mouth daily.       thyroid (ARMOUR) 30 MG tablet Take 30 mg by mouth daily before breakfast.     traZODone (DESYREL) 50 MG tablet Take 50 mg by mouth at bedtime as needed for sleep.     ZEPBOUND 15 MG/0.5ML Pen Inject 15 mg into the skin once a week.     metoprolol succinate (TOPROL XL) 25 MG 24 hr tablet Take 1 tablet (25 mg total) by mouth daily as needed (afib).     No current facility-administered medications for this encounter.    Physical Exam: BP 134/88    Pulse 71   Ht 5\' 1"  (1.549 m)   Wt 68.3 kg   BMI 28.46 kg/m   GEN: Well nourished, well developed in no acute distress CARDIAC: Regular rate and rhythm, no murmurs, rubs, gallops RESPIRATORY:  Clear to auscultation without rales, wheezing or rhonchi  ABDOMEN: Soft, non-tender, non-distended EXTREMITIES:  No edema; No deformity   Wt Readings from Last 3 Encounters:  03/01/24 68.3 kg  02/01/24 69 kg  01/19/24 70.5 kg     EKG today demonstrates  SR Vent. rate 71 BPM PR interval 182 ms QRS duration 80 ms QT/QTcB 386/419 ms   Echo 10/02/18 demonstrated  - Left ventricle: The cavity size was mildly reduced. Wall    thickness was increased in a pattern of mild LVH. Systolic    function was normal. The estimated ejection fraction was in the    range of 60% to 65%. Wall motion was normal; there were no    regional wall motion abnormalities. Left ventricular diastolic    function parameters were normal for the patient&'s age.  - Aortic valve: There was no regurgitation.  - Mitral valve: There was trivial regurgitation.  - Left atrium: The atrium was mildly dilated.  - Right ventricle: Systolic function was normal.  - Atrial septum: No defect or patent foramen ovale was identified.  - Tricuspid valve: There was trivial regurgitation.  - Pulmonic valve: There was no significant regurgitation.   Impressions:   - Normal LV EF, no significant diastolic dysfunction. No    significant valvular abnormalities.    CHA2DS2-VASc Score = 3  The patient's score is based upon: CHF History: 0 HTN History: 0 Diabetes History: 0 Stroke History: 0 Vascular Disease History: 0 Age Score: 2 Gender Score: 1       ASSESSMENT AND PLAN: Paroxysmal Atrial Fibrillation/atrial flutter The patient's CHA2DS2-VASc score is 3, indicating a 3.2% annual risk of stroke.   S/p ablation 1999, afib and flutter ablation 06/2019 Patient in SR today. We discussed rhythm control options including starting  metoprolol, resuming propafenone, or repeat ablation. She would like to continue watchful waiting for now and use the metoprolol as needed if she goes into afib.  Continue Eliquis 5 mg BID Continue Toprol 25 mg daily PRN for heart racing Apple Watch for home monitoring.   Secondary Hypercoagulable State (ICD10:  D68.69) The patient is at significant risk for stroke/thromboembolism based upon her CHA2DS2-VASc Score of 3.  Continue Apixaban (Eliquis). No bleeding issues  OSA  Encouraged nightly CPAP    Follow up in the AF clinic in 3 months.        Jorja Loa PA-C [facility]  [address] [place], [place] [address] [phone]

## 2024-03-05 ENCOUNTER — Telehealth: Payer: Self-pay

## 2024-03-05 NOTE — Telephone Encounter (Signed)
-----   Message from Frances Smith sent at 03/04/2024  8:57 AM EDT ----- HST was normal, she had a couple of apneic events but they were not frequent enough to meet diagnostic criteria. If she would like to discuss further please set up virtual visit

## 2024-03-05 NOTE — Telephone Encounter (Signed)
 Called and informed pt and pt is scheduled.NFN

## 2024-04-23 ENCOUNTER — Other Ambulatory Visit: Payer: Self-pay | Admitting: Obstetrics and Gynecology

## 2024-04-23 DIAGNOSIS — Z1231 Encounter for screening mammogram for malignant neoplasm of breast: Secondary | ICD-10-CM

## 2024-04-24 ENCOUNTER — Ambulatory Visit (INDEPENDENT_AMBULATORY_CARE_PROVIDER_SITE_OTHER): Admitting: Primary Care

## 2024-04-24 ENCOUNTER — Encounter: Payer: Self-pay | Admitting: Primary Care

## 2024-04-24 VITALS — BP 110/68 | HR 65 | Ht 61.0 in | Wt 152.0 lb

## 2024-04-24 DIAGNOSIS — Z87891 Personal history of nicotine dependence: Secondary | ICD-10-CM | POA: Diagnosis not present

## 2024-04-24 DIAGNOSIS — G4733 Obstructive sleep apnea (adult) (pediatric): Secondary | ICD-10-CM | POA: Diagnosis not present

## 2024-04-24 DIAGNOSIS — I4891 Unspecified atrial fibrillation: Secondary | ICD-10-CM

## 2024-04-24 NOTE — Progress Notes (Signed)
 @Patient  ID: Frances Smith, female    DOB: Mar 25, 1946, 78 y.o.   MRN: [id number]  Chief Complaint  Patient presents with   Obstructive Sleep Apnea    Referring provider: Medicine, [facility]*  HPI: 78 year old female, former smoker. PMH significant for OSA, afib, GERD, hypothyroidism, hyperlipidemia, GAD. Patient of Dr. Matilde Son. Maintained on auto CPAP.   Previous [facility] pulmonary encounter:  08/08/2022 Patient presents today for OSA follow-up. She is doing well. She is no longer snoring. She is 100% compliant with use. Took her some time to get used to wearing CPAP. She is using small size nasal cradle mask. No significant daytime sleepiness. Needing CPAP supplies renewed, DME company is [employer].   Airview download 07/09/22-08/07/22 Usage 30/30 days; 25 days (83%) > 4 hours Usage 7 hours 34 mins Pressure 5-15cm h20 (7.4cm h20-95%) Airleaks 16.3L/min AHI 0.3  02/01/2024 Discussed the use of AI scribe software for clinical note transcription with the patient, who gave verbal consent to proceed.  History of Present Illness   The patient, with obstructive sleep apnea, presents for follow-up of sleep apnea management. She is accompanied by her husband. She is a former patient of [employer].  She has a history of mild sleep apnea diagnosed in April 2019, with a sleep study showing an average of 10.6 apneic or hypopneic events per hour. At that time, her weight was 197 pounds. She has been using an auto CPAP with settings of 5 to 15 cm H2O and has 93% compliance over the last 30 days, averaging 8.5 hours of use per night. Her residual apnea score is 0.2. Since the last visit in September 2023, her husband has noted a reduction in snoring on nights when she does not use the CPAP.  She has experienced significant weight loss, approximately 45 pounds, and currently maintains a stable weight. This weight loss may have impacted the severity of her sleep apnea.  She has been on a medication regimen  of Zepound, currently on a maintenance dose of 15 mg, which she started in November. The medication has been effective, contributing to her weight loss, although she experiences some challenges with maintaining adequate nutrition due to reduced appetite. Her insurance covers the medication, and she pays $38 for a three-month supply.      Airview download 01/01/2024 - 01/30/2024 Usage days 28/30 days (93%) greater than 4 hours Average usage days used 8 hours 35 minutes Pressure 5 to 15 cm H2O (8.9 cm H2O-95%) Air leaks 18.6 L/min (95%) AHI 0.2  04/24/2024- INTERIM Discussed the use of AI scribe software for clinical note transcription with the patient, who gave verbal consent to proceed.  History of Present Illness   Frances Smith is an 78 year old female who presents for evaluation of her CPAP use after significant weight loss.  She has a history of mild sleep apnea diagnosed in 2019 with 10.6 apneic events per hour and has been using a CPAP machine since then. Over the past two years, she has lost about 45 pounds and her weight has been stable for the last year. She is currently on Zepbound 15 mg and has reached her weight goal of 147 pounds. A recent sleep study at the end of March showed 4.4 apneic events per hour and minimal time with oxygen levels below 88%. No significant symptoms such as disruptive snoring, gasping, choking, or excessive daytime sleepiness. She occasionally goes off the CPAP for a week at a time and feels fine  without it.  She has a history of atrial fibrillation and underwent ablation in 1999 and again in 2020. She is currently on Eliquis  5 mg twice a day and has been prescribed Toprol  25 mg daily as needed for racing heart, although she has not started taking it yet. She experiences very short spurts of atrial fibrillation occasionally. She also has an Apple Watch to monitor her heart rhythm.  She does not consume alcohol or take unprescribed sedatives before bed.       Sleep testing:  PSG 08/01/12 >> AHI 0 HST 02/22/18 >> AHI 10.6, SaO2 low 86% HST 01/2024>> AHI 4.4, Sp O2 low 87% / Weight 152 lbs  Allergies  Allergen Reactions   Codeine Itching, Rash and Hives    Immunization History  Administered Date(s) Administered   Hep A / Hep B 02/12/2015   Hepatitis B, PED/ADOLESCENT 03/16/2015, 09/15/2015   Influenza Split 10/17/2011, 08/03/2017   Influenza, High Dose Seasonal PF 10/18/2016, 09/19/2017, 08/29/2018, 08/21/2019   Influenza, Seasonal, Injecte, Preservative Fre 02/12/2015, 09/21/2015   Influenza,inj,Quad PF,6+ Mos 02/12/2015, 09/21/2015, 09/19/2017, 08/22/2019   Influenza,inj,quad, With Preservative 09/19/2017   Influenza,trivalent, recombinat, inj, PF 10/17/2011, 08/24/2012, 08/03/2017   Influenza-Unspecified 10/03/2023   PFIZER Comirnaty(Gray Top)Covid-19 Tri-Sucrose Vaccine 10/03/2023   PFIZER(Purple Top)SARS-COV-2 Vaccination 12/05/2019, 12/24/2019   Pfizer(Comirnaty)Fall Seasonal Vaccine 12 years and older 09/30/2022   Pneumococcal Conjugate-13 10/14/2015   Pneumococcal Polysaccharide-23 11/25/2011   Pneumococcal-Unspecified 11/25/2011   Tdap 11/25/2011   Zoster Recombinant(Shingrix) 08/21/2019   Zoster, Live 10/18/2016    Past Medical History:  Diagnosis Date   Anal fissure    Anxiety    Arthritis    Atrial fibrillation (HCC)    Colon polyps    Depression    Diverticulosis    Dysrhythmia    GERD (gastroesophageal reflux disease)    H/O: hysterectomy    Heart murmur    as a child   History of bronchitis as a child    History of head injury    History of measles    History of mumps    Hyperlipidemia    Hypothyroidism    IBS (irritable bowel syndrome)    Menopause    OSA on CPAP    Osteopenia    Paroxysmal atrial fibrillation (HCC)    Restless leg syndrome    Thyroid  disease    Venous insufficiency    right leg    Tobacco History: Social History   Tobacco Use  Smoking Status Former   Current packs/day:  0.00   Average packs/day: 0.5 packs/day for 5.0 years (2.5 ttl pk-yrs)   Types: Cigarettes   Start date: 11/22/1959   Quit date: 11/21/1964   Years since quitting: 59.4   Passive exposure: Never  Smokeless Tobacco Never  Tobacco Comments   Former smoker 03/01/24   Counseling given: Not Answered Tobacco comments: Former smoker 03/01/24   Outpatient Medications Prior to Visit  Medication Sig Dispense Refill   acetaminophen  (TYLENOL ) 500 MG tablet Take 1,000 mg by mouth every 8 (eight) hours as needed for moderate pain.     ALPRAZolam (XANAX) 0.25 MG tablet Take 0.25 mg by mouth as needed for anxiety.     Ascorbic Acid (VITAMIN C) 1000 MG tablet Take 1,000 mg by mouth in the morning and at bedtime.     b complex vitamins tablet Take 1 tablet by mouth daily.     Cholecalciferol 50 MCG (2000 UT) TABS Take 2,000 Units by mouth daily.     DULoxetine  (CYMBALTA )  60 MG capsule Take 60 mg by mouth daily.     ELIQUIS  5 MG TABS tablet TAKE 1 TABLET TWICE A DAY 180 tablet 3   famotidine  (PEPCID ) 40 MG tablet Take 40 mg by mouth 2 (two) times daily.     MAGNESIUM  CHLORIDE-CALCIUM PO Take 1 tablet by mouth in the morning and at bedtime.     melatonin 5 MG TABS Take 5 mg by mouth at bedtime.     metoprolol  succinate (TOPROL  XL) 25 MG 24 hr tablet Take 1 tablet (25 mg total) by mouth daily as needed (afib).     Multiple Vitamins-Minerals (MULTIVITAMIN ADULT PO) Take 1 tablet by mouth daily.     pravastatin  (PRAVACHOL ) 40 MG tablet Take 40 mg by mouth daily.       thyroid  (ARMOUR) 30 MG tablet Take 30 mg by mouth daily before breakfast.     traZODone  (DESYREL ) 50 MG tablet Take 50 mg by mouth at bedtime as needed for sleep.     ZEPBOUND 15 MG/0.5ML Pen Inject 15 mg into the skin once a week.     No facility-administered medications prior to visit.    Review of Systems  Review of Systems  Constitutional: Negative.   HENT: Negative.    Respiratory: Negative.     Physical Exam  BP 110/68   Pulse  65   Ht 5\' 1"  (1.549 m)   Wt 152 lb (68.9 kg)   SpO2 99%   BMI 28.72 kg/m  Physical Exam Constitutional:      Appearance: Normal appearance.  HENT:     Head: Normocephalic and atraumatic.  Cardiovascular:     Rate and Rhythm: Normal rate and regular rhythm.  Pulmonary:     Effort: Pulmonary effort is normal.     Breath sounds: Normal breath sounds.  Musculoskeletal:        General: Normal range of motion.  Skin:    General: Skin is warm and dry.  Neurological:     General: No focal deficit present.     Mental Status: She is alert and oriented to person, place, and time. Mental status is at baseline.  Psychiatric:        Mood and Affect: Mood normal.        Behavior: Behavior normal.        Thought Content: Thought content normal.        Judgment: Judgment normal.      Lab Results:  CBC    Component Value Date/Time   WBC 8.1 07/20/2021 0337   RBC 3.96 07/20/2021 0337   HGB 11.9 (L) 07/20/2021 0337   HGB 13.7 07/16/2019 1035   HCT 36.5 07/20/2021 0337   HCT 42.8 07/16/2019 1035   PLT 178 07/20/2021 0337   PLT 202 07/16/2019 1035   MCV 92.2 07/20/2021 0337   MCV 93 07/16/2019 1035   MCH 30.1 07/20/2021 0337   MCHC 32.6 07/20/2021 0337   RDW 13.5 07/20/2021 0337   RDW 13.4 07/16/2019 1035   LYMPHSABS 1.1 07/16/2019 1035   EOSABS 0.2 07/16/2019 1035   BASOSABS 0.1 07/16/2019 1035    BMET    Component Value Date/Time   NA 140 07/20/2021 0337   NA 141 07/16/2019 1035   K 4.1 07/20/2021 0337   CL 109 07/20/2021 0337   CO2 24 07/20/2021 0337   GLUCOSE 132 (H) 07/20/2021 0337   BUN 12 07/20/2021 0337   BUN 18 07/16/2019 1035   CREATININE 0.77 07/20/2021 1610  CALCIUM 9.0 07/20/2021 0337   GFRNONAA >60 07/20/2021 0337   GFRAA 86 07/16/2019 1035    BNP No results found for: "BNP"  ProBNP No results found for: "PROBNP"  Imaging: No results found.   Assessment & Plan:    1. OSA (obstructive sleep apnea) (Primary)  Assessment and Plan     Sleep Apnea Previous diagnosis of mild sleep apnea with 10.6 apneic events per hour. Recent sleep study shows improvement with 4.4 apneic events per hour and minimal time with hypoxemia. Weight loss has contributed to improvement. Expresses interest in discontinuing CPAP. No significant symptoms such as disruptive snoring, gasping, or excessive daytime sleepiness. Discussed positional sleep strategies and lifestyle modifications to maintain improvement. Reassured by recent sleep study results and willing to trial discontinuation of CPAP with the option to resume if symptoms or weight gain occur. - Discontinue CPAP use. - Maintain current weight and avoid weight gain. - Practice positional sleep strategies, such as side sleeping or using a wedge pillow. - Avoid alcohol and unprescribed sedatives before bed. - Contact provider if significant symptoms or weight gain occur.  Atrial Fibrillation Intermittent atrial fibrillation with occasional short spurts. History of ablation in 1999 and 2020. Currently on Eliquis  5 mg twice daily. Cardiologist recommended metoprolol  25 mg daily as needed for palpitations. Prefers watchful waiting and using metoprolol  as needed. No significant correlation with untreated sleep apnea due to mild severity of previous sleep apnea. - Continue Eliquis  5 mg twice daily. - Use metoprolol  25 mg daily as needed for palpitations. - Monitor atrial fibrillation episodes using Apple Watch.  Antonio Baumgarten, NP 04/24/2024

## 2024-04-24 NOTE — Patient Instructions (Addendum)
-  ATRIAL FIBRILLATION: Atrial fibrillation is an irregular and often rapid heart rate that can increase your risk of strokes, heart failure, and other heart-related complications. You will continue taking Eliquis  5 mg twice daily and use metoprolol  25 mg daily as needed for palpitations. Please monitor your heart rhythm using your Apple Watch and report any significant changes.  -SLEEP APNEA: Sleep apnea is a sleep disorder where breathing repeatedly stops and starts. Your recent sleep study shows improvement, no significant abnormal breathing pattern. You can discontinue using your CPAP machine but should maintain your current weight and avoid weight gain. Practice positional sleep strategies, such as side sleeping or using a wedge pillow, and avoid alcohol and unprescribed sedatives before bed. Contact us  if you experience significant symptoms or weight gain.  INSTRUCTIONS: Please continue taking Eliquis  5 mg twice daily and use metoprolol  25 mg daily as needed for palpitations. Monitor your heart rhythm using your Apple Watch and report any significant changes. Discontinue using your CPAP machine, maintain your current weight, and practice positional sleep strategies. Avoid alcohol and unprescribed sedatives before bed. Contact us  if you experience significant symptoms or weight gain.  Follow-up 6 months with [facility] NP or sooner if needed

## 2024-05-03 ENCOUNTER — Ambulatory Visit
Admission: RE | Admit: 2024-05-03 | Discharge: 2024-05-03 | Disposition: A | Discharge status: Home / Self Care | Source: Ambulatory Visit | Attending: Obstetrics and Gynecology | Admitting: Obstetrics and Gynecology

## 2024-05-03 DIAGNOSIS — Z1231 Encounter for screening mammogram for malignant neoplasm of breast: Secondary | ICD-10-CM

## 2024-05-15 ENCOUNTER — Ambulatory Visit (INDEPENDENT_AMBULATORY_CARE_PROVIDER_SITE_OTHER): Admitting: Orthopaedic Surgery

## 2024-05-15 ENCOUNTER — Other Ambulatory Visit (INDEPENDENT_AMBULATORY_CARE_PROVIDER_SITE_OTHER): Payer: Self-pay

## 2024-05-15 VITALS — Ht 61.5 in | Wt 152.2 lb

## 2024-05-15 DIAGNOSIS — G8929 Other chronic pain: Secondary | ICD-10-CM

## 2024-05-15 DIAGNOSIS — M25561 Pain in right knee: Secondary | ICD-10-CM

## 2024-05-15 NOTE — Progress Notes (Signed)
 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  She also has a left partial knee replacement.  She says she did not have really a lot of pain from her right knee replacement but she does have what is described as patella clunk.  She still cannot sit too long without a stiff and clicking sensation a burning sensation in the knee when she first gets up.  Has been slowly getting worse for up to year now.  She has lost weight and she feels like there is certainly some muscle atrophy as well.  Most of her pain is behind the patella.  Examination of her right knee shows her range of motion is full and the knee feels lunacy stable.  There is a clicking and clunking sensation behind the patella throughout flexion and extension.  It does not seem like is tracking laterally though.  2 views of the right knee show well-seated total knee arthroplasty.  There are calcifications at the inferior and superior poles of the patella and this may be causing the symptoms as well.  I did offer a one-time steroid injection in her right knee joint to calm down some of the deep inflammation and I would like to send her to outpatient physical therapy for any modalities that can help patella tracking on the right side as well as strengthening different muscle groups of the quad muscles.  She would like to try conservative treatment as well.  She is 78 years old.  She does walk several miles every other day.  We did describe the possibility of arthroscopic intervention and even open intervention to try to treat this.  We will see her back in 6 weeks after course of therapy and to see how the injection is done.  She agrees with the treatment plan.

## 2024-05-16 ENCOUNTER — Other Ambulatory Visit: Payer: Self-pay

## 2024-05-16 DIAGNOSIS — G8929 Other chronic pain: Secondary | ICD-10-CM

## 2024-05-24 ENCOUNTER — Other Ambulatory Visit: Payer: Self-pay | Admitting: Physician Assistant

## 2024-05-24 DIAGNOSIS — I480 Paroxysmal atrial fibrillation: Secondary | ICD-10-CM

## 2024-05-27 NOTE — Telephone Encounter (Signed)
 Eliquis  5mg  refill request received. Patient is 78 years old, weight-69kg, Crea-0.94 on 01/17/24 via [facility] from [facility], [place], and last seen by Quita Kicks on 03/01/24. Dose is appropriate based on dosing criteria.

## 2024-05-31 ENCOUNTER — Ambulatory Visit (HOSPITAL_COMMUNITY): Admitting: Physician Assistant

## 2024-06-05 NOTE — Therapy (Signed)
 OUTPATIENT PHYSICAL THERAPY LOWER EXTREMITY EVALUATION   Patient Name: Frances Smith MRN: [id number] DOB:01-31-1946, 78 y.o., female Today's Date: 06/06/2024  END OF SESSION:  PT End of Session - 06/06/24 1341     Visit Number 1    Number of Visits 16    Date for PT Re-Evaluation 08/01/24    Authorization Type MEDICARE AND [employer]    Progress Note Due on Visit 10    PT Start Time 1347    PT Stop Time 1432    PT Time Calculation (min) 45 min    Activity Tolerance Patient tolerated treatment well    Behavior During Therapy WFL for tasks assessed/performed          Past Medical History:  Diagnosis Date   Anal fissure    Anxiety    Arthritis    Atrial fibrillation (HCC)    Colon polyps    Depression    Diverticulosis    Dysrhythmia    GERD (gastroesophageal reflux disease)    H/O: hysterectomy    Heart murmur    as a child   History of bronchitis as a child    History of head injury    History of measles    History of mumps    Hyperlipidemia    Hypothyroidism    IBS (irritable bowel syndrome)    Menopause    OSA on CPAP    Osteopenia    Paroxysmal atrial fibrillation (HCC)    Restless leg syndrome    Thyroid  disease    Venous insufficiency    right leg   Past Surgical History:  Procedure Laterality Date   ATRIAL FIBRILLATION ABLATION N/A 07/19/2019   Procedure: ATRIAL FIBRILLATION ABLATION;  Surgeon: Kelsie Agent, MD;  Location: [facility];  Service: Cardiovascular;  Laterality: N/A;   BREAST CYST ASPIRATION     multiple but not sure when   BREAST CYST INCISION AND DRAINAGE Left 1985   BREAST SURGERY     aspiration of cyst / left    COLONOSCOPY  2010   ENDOMETRIAL ABLATION     PARTIAL KNEE ARTHROPLASTY Left 08/10/2016   Procedure: LEFT KNEE UNICOMPARTMENTAL ARTHROPLASTY;  Surgeon: Dempsey Moan, MD;  Location: [facility];  Service: Orthopedics;  Laterality: Left;   TONSILLECTOMY AND ADENOIDECTOMY  1949   TOTAL KNEE ARTHROPLASTY Right  07/19/2021   Procedure: TOTAL KNEE ARTHROPLASTY;  Surgeon: Moan Dempsey, MD;  Location: [facility];  Service: Orthopedics;  Laterality: Right;   TREATMENT FISTULA ANAL  1988   VAGINAL HYSTERECTOMY  1984   Patient Active Problem List   Diagnosis Date Noted   Hypercoagulable state due to paroxysmal atrial fibrillation (HCC) 03/01/2024   Primary osteoarthritis of right knee 07/19/2021   Paroxysmal atrial fibrillation (HCC) 07/19/2019   GERD (gastroesophageal reflux disease) 09/25/2018   Adhesive capsulitis of right shoulder 07/16/2018   Pain in joint of right shoulder 06/18/2018   BMI 34.0-34.9,adult 12/01/2016   Obesity, Class I, BMI 30-34.9 12/01/2016   History of right knee joint replacement 08/31/2016   Bilateral primary osteoarthritis of knee 02/23/2016   Hyperglycemia 10/14/2015   Primary osteoarthritis of knee 08/20/2015   Palpitation 04/16/2015   Primary insomnia 07/24/2014   Osteoporosis 02/27/2013   Generalized anxiety disorder 08/24/2012   Restless leg syndrome 07/30/2012   Knee pain, right 07/13/2012   OSA (obstructive sleep apnea) 07/04/2012   Benign colon polyp 11/25/2011   Osteopenia 11/25/2011   Hypothyroidism 10/18/2011   Chest pain 09/01/2011  Hyperlipidemia 02/02/2009   ATRIAL FIBRILLATION 02/02/2009    PCP: [facility]  REFERRING PROVIDER: Lonni CINDERELLA Poli, MD   REFERRING DIAG: [phone] (ICD-10-CM) - Chronic pain of right knee  THERAPY DIAG:  Chronic pain of right knee - Plan: PT plan of care cert/re-cert  Muscle weakness (generalized) - Plan: PT plan of care cert/re-cert  Other abnormalities of gait and mobility - Plan: PT plan of care cert/re-cert  Rationale for Evaluation and Treatment: Rehabilitation  ONSET DATE: at least one year since the beginning of her pain  SUBJECTIVE:   SUBJECTIVE STATEMENT: This has definitely slowed me down which is disappointing.   PERTINENT HISTORY: Patient has undergone a  partial TKA on L knee in 2017 and a full TKA on R knee in 2020. Patient notes that she has been experiencing a patellar clunk, crepitus, and a feeling of her femur and tibia being stuck on each other in the R knee that began at least one year ago. She recently received a cortisone shot in the R knee that did not assist with her pain levels. She has difficulty with sitting for prolonged periods of time and riding in the car for long periods of time where she begins to experience burning in the knee after 20-30 minutes. Patient endorses recent weight loss and increase in physical activity which she believes may have increased her pain. Patient also mentions that she may have neuropathy in a few toes on her R foot.    PAIN:  Are you having pain? Yes: NPRS scale: 0/10 when seated and resting; 7-8/10 at worst  Pain location: entire knee; when caught it feels like the tibia and femur are stuck together   Pain description: burning Aggravating factors: car rides, prolonged sitting in one position Relieving factors: mobility, tylenol    PRECAUTIONS: None  RED FLAGS: None   WEIGHT BEARING RESTRICTIONS: No  FALLS:  Has patient fallen in last 6 months? No  LIVING ENVIRONMENT: Lives with: lives with their spouse Lives in: House/apartment Stairs: Yes: External: 8 going into backyard, 5 into house, 2 into side entrance steps; can reach both Has following equipment at home: Single point cane, Walker - 4 wheeled, Crutches, and Grab bars  OCCUPATION: retired   PLOF: Independent  PATIENT GOALS: improve balance, less painful  NEXT MD VISIT: August   OBJECTIVE:  Note: Objective measures were completed at Evaluation unless otherwise noted.  DIAGNOSTIC FINDINGS: 2 views of the right knee show well-seated total knee arthroplasty with no  complicating features.  There are small areas of calcification at the  superior and inferior poles of the patella component.  There is no  effusion.  The alignment  is anatomic.  PATIENT SURVEYS:  PSFS: THE PATIENT SPECIFIC FUNCTIONAL SCALE  Place score of 0-10 (0 = unable to perform activity and 10 = able to perform activity at the same level as before injury or problem)  Activity Date: 06/06/2024    Sitting for long periods of time  5    2. Riding in the car for long periods of time  4    3. Walking upstairs  5    4. Walking downstairs  5    5. Balance  4    Total Score 4.6      Total Score = Sum of activity scores/number of activities  Minimally Detectable Change: 3 points (for single activity); 2 points (for average score)  [facility] (nd). The Patient Specific Functional Scale . Retrieved  from [email or web address]   COGNITION: Overall cognitive status: Within functional limits for tasks assessed     SENSATION: Light touch: WFL  EDEMA: No edema noted on eval  MUSCLE LENGTH: Not assessed this date   POSTURE: No Significant postural limitations  PALPATION: TTP at R medial joint line and medial femoral condyle; patella has appropriate movement medial<>lateral, however, decreased movement with superior<>inferior movement  LOWER EXTREMITY ROM:  ROM Right Eval 06/06/2024 Left Eval 06/06/2024  Hip flexion A: 110 P: 119 supine A: 112 P: 125 supine  Hip extension    Hip abduction    Hip adduction    Hip internal rotation    Hip external rotation    Knee flexion A: 103 Seated;  Unable to perform PROM 2/2 pain A: 120 Seated; unable to perform PROM 2/2 pain  Knee extension A: 3deg of flexion A: 2deg of flexion  Ankle dorsiflexion    Ankle plantarflexion    Ankle inversion    Ankle eversion     (Blank rows = not tested)  LOWER EXTREMITY MMT:  MMT Right Eval 06/06/2024 Left Eval 06/06/2024  Hip flexion 3+/5 seated 4-/5 seated  Hip extension 3+/5 prone 3/5 prone  Hip abduction 4/5 sidelying 4+5 sidelying  Hip adduction    Hip internal rotation     Hip external rotation    Knee flexion 5/5 seated 5/5 seated  Knee extension 5/5 seated 5/5 seated  Ankle dorsiflexion    Ankle plantarflexion    Ankle inversion    Ankle eversion     (Blank rows = not tested)  LOWER EXTREMITY SPECIAL TESTS:  None performed on eval  FUNCTIONAL TESTS:  None performed on eval  GAIT: Distance walked: not formally assessed  Assistive device utilized: None Level of assistance: Complete Independence Comments: no overt gait deviation noted upon eval; will need a more in depth evaluation                                                                                                                                 TREATMENT DATE: 06/06/24  TherEx:   HEP handout provided with PT providing demonstration of each exercise. Patient performed 1 set of each exercise with verbal cues for appropriate form. Education provided on purpose of each exercise.   PATIENT EDUCATION:  Education details: HEP, daily physical activity, taking mobility breaks during prolonged sitting, hydration Person educated: Patient Education method: Explanation, Demonstration, Verbal cues, and Handouts Education comprehension: verbalized understanding and returned demonstration  HOME EXERCISE PROGRAM: Access Code: [email or web address] URL: [email or web address] Date: 06/06/2024 Prepared by: Susannah Daring  Exercises - Seated Figure 4 Piriformis Stretch  - 1 x daily - 7 x weekly - 3 sets - 30 hold - Mini Squat with Counter Support  - 1 x daily - 7 x weekly - 3 sets - 10 reps - Standing March with Counter Support  - 1 x daily - 7 x weekly - 3 sets - 10  reps - Standing Hip Extension with Counter Support  - 1 x daily - 7 x weekly - 3 sets - 10 reps - Hooklying Single Knee to Chest Stretch  - 1 x daily - 7 x weekly - 3 sets - 30 hold  ASSESSMENT:  CLINICAL IMPRESSION: Patient is a 78 y.o. F who was seen today for physical therapy evaluation and treatment for chronic R knee pain  with mobility deficits, increased pain, and decreased strength leading to functional deficits within the home and community. Patient has undergone a L partial TKA in 2017 and R full TKA in 2020 and she has also received a cortisone shot that she reports did not make any improvements on her pain levels. Patient was educated on importance of hydration especially with increase in physical activity and continued mobility in order to decrease pain in R knee. Patient will benefit from skilled PT to improve above noted deficits.   OBJECTIVE IMPAIRMENTS: decreased balance, decreased mobility, decreased ROM, decreased strength, hypomobility, impaired perceived functional ability, impaired flexibility, and pain.   ACTIVITY LIMITATIONS: bending, squatting, and stairs  PARTICIPATION LIMITATIONS: driving and community activity  PERSONAL FACTORS: Fitness, Time since onset of injury/illness/exacerbation, and 3+ comorbidities: arthritis, hyperlipidemia, GERD, osteopenia are also affecting patient's functional outcome.   REHAB POTENTIAL: Good  CLINICAL DECISION MAKING: Evolving/moderate complexity  EVALUATION COMPLEXITY: Moderate   GOALS: Goals reviewed with patient? Yes  SHORT TERM GOALS: Target date: 06/27/2024 Patient will show compliance with HEP in order to maintain improvements made within PT.  Baseline: Goal status: INITIAL  2.  Patient will report decreased pain levels of </= 4/10 with prolonged sitting in order to improve quality of life with travel.  Baseline:  Goal status: INITIAL   LONG TERM GOALS: Target date: 08/01/2024  Patient will show independence with HEP in order to maintain and progress improvement made within PT.  Baseline:  Goal status: INITIAL  2.  Patient will report increase in PSFS to at least 6.6 in order to show a significant decrease in perceived disability with functional tasks.  Baseline:  Goal status: INITIAL  3.  Patient will increase R and L hip extension MMT to  at least 4/5 to show biomechanical improvements in functional tasks, like squatting.  Baseline:  Goal status: INITIAL  4.  Patient will increase R knee flexion AROM to at least 120 deg in order improve functional tasks.  Baseline:  Goal status: INITIAL  5.  Patient will report decreased pain levels of </= 2/10 with prolonged sitting in order to improve quality of life with travel. Baseline:  Goal status: INITIAL    PLAN:  PT FREQUENCY: 1-2x/week  PT DURATION: 8 weeks  PLANNED INTERVENTIONS: [id number]- PT Re-evaluation, 97750- Physical Performance Testing, 97110-Therapeutic exercises, 97530- Therapeutic activity, [id number]- Neuromuscular re-education, 97535- Self Care, [id number]- Manual therapy, [id number]- Gait training, [phone]- Electrical stimulation (unattended), [phone]- Electrical stimulation (manual), [id number]- Vasopneumatic device, [id number]- Ultrasound, [id number]- Traction (mechanical), [id number]- Ionotophoresis 4mg /ml Dexamethasone , 79439 (1-2 muscles), [id number] (3+ muscles)- Dry Needling, Patient/Family education, Balance training, Stair training, Joint mobilization, Cryotherapy, and Moist heat  PLAN FOR NEXT SESSION: balance assessment, ankle assessment, stair assessment, review of HEP, hip musculature strengthening, LE flexibility, vaso, hydration assessment    Susannah Daring, PT, DPT 06/06/24 3:26 PM

## 2024-06-06 ENCOUNTER — Ambulatory Visit

## 2024-06-06 DIAGNOSIS — M6281 Muscle weakness (generalized): Secondary | ICD-10-CM

## 2024-06-06 DIAGNOSIS — R2689 Other abnormalities of gait and mobility: Secondary | ICD-10-CM | POA: Diagnosis not present

## 2024-06-06 DIAGNOSIS — M25561 Pain in right knee: Secondary | ICD-10-CM

## 2024-06-06 DIAGNOSIS — G8929 Other chronic pain: Secondary | ICD-10-CM | POA: Diagnosis not present

## 2024-06-11 ENCOUNTER — Ambulatory Visit (INDEPENDENT_AMBULATORY_CARE_PROVIDER_SITE_OTHER)

## 2024-06-11 DIAGNOSIS — R2689 Other abnormalities of gait and mobility: Secondary | ICD-10-CM | POA: Diagnosis not present

## 2024-06-11 DIAGNOSIS — M6281 Muscle weakness (generalized): Secondary | ICD-10-CM | POA: Diagnosis not present

## 2024-06-11 DIAGNOSIS — G8929 Other chronic pain: Secondary | ICD-10-CM

## 2024-06-11 DIAGNOSIS — M25561 Pain in right knee: Secondary | ICD-10-CM | POA: Diagnosis not present

## 2024-06-11 NOTE — Therapy (Signed)
 OUTPATIENT PHYSICAL THERAPY LOWER EXTREMITY TREATMENT   Patient Name: Frances Smith MRN: [id number] DOB:03/26/1946, 78 y.o., female Today's Date: 06/11/2024  END OF SESSION:  PT End of Session - 06/11/24 1555     Visit Number 2    Number of Visits 16    Date for PT Re-Evaluation 08/01/24    Authorization Type MEDICARE AND [employer]    PT Start Time 1505    PT Stop Time 1543    PT Time Calculation (min) 38 min    Activity Tolerance Patient tolerated treatment well    Behavior During Therapy WFL for tasks assessed/performed           Past Medical History:  Diagnosis Date   Anal fissure    Anxiety    Arthritis    Atrial fibrillation (HCC)    Colon polyps    Depression    Diverticulosis    Dysrhythmia    GERD (gastroesophageal reflux disease)    H/O: hysterectomy    Heart murmur    as a child   History of bronchitis as a child    History of head injury    History of measles    History of mumps    Hyperlipidemia    Hypothyroidism    IBS (irritable bowel syndrome)    Menopause    OSA on CPAP    Osteopenia    Paroxysmal atrial fibrillation (HCC)    Restless leg syndrome    Thyroid  disease    Venous insufficiency    right leg   Past Surgical History:  Procedure Laterality Date   ATRIAL FIBRILLATION ABLATION N/A 07/19/2019   Procedure: ATRIAL FIBRILLATION ABLATION;  Surgeon: Kelsie Agent, MD;  Location: [facility];  Service: Cardiovascular;  Laterality: N/A;   BREAST CYST ASPIRATION     multiple but not sure when   BREAST CYST INCISION AND DRAINAGE Left 1985   BREAST SURGERY     aspiration of cyst / left    COLONOSCOPY  2010   ENDOMETRIAL ABLATION     PARTIAL KNEE ARTHROPLASTY Left 08/10/2016   Procedure: LEFT KNEE UNICOMPARTMENTAL ARTHROPLASTY;  Surgeon: Dempsey Moan, MD;  Location: [facility];  Service: Orthopedics;  Laterality: Left;   TONSILLECTOMY AND ADENOIDECTOMY  1949   TOTAL KNEE ARTHROPLASTY Right 07/19/2021   Procedure: TOTAL KNEE  ARTHROPLASTY;  Surgeon: Moan Dempsey, MD;  Location: [facility];  Service: Orthopedics;  Laterality: Right;   TREATMENT FISTULA ANAL  1988   VAGINAL HYSTERECTOMY  1984   Patient Active Problem List   Diagnosis Date Noted   Hypercoagulable state due to paroxysmal atrial fibrillation (HCC) 03/01/2024   Primary osteoarthritis of right knee 07/19/2021   Paroxysmal atrial fibrillation (HCC) 07/19/2019   GERD (gastroesophageal reflux disease) 09/25/2018   Adhesive capsulitis of right shoulder 07/16/2018   Pain in joint of right shoulder 06/18/2018   BMI 34.0-34.9,adult 12/01/2016   Obesity, Class I, BMI 30-34.9 12/01/2016   History of right knee joint replacement 08/31/2016   Bilateral primary osteoarthritis of knee 02/23/2016   Hyperglycemia 10/14/2015   Primary osteoarthritis of knee 08/20/2015   Palpitation 04/16/2015   Primary insomnia 07/24/2014   Osteoporosis 02/27/2013   Generalized anxiety disorder 08/24/2012   Restless leg syndrome 07/30/2012   Knee pain, right 07/13/2012   OSA (obstructive sleep apnea) 07/04/2012   Benign colon polyp 11/25/2011   Osteopenia 11/25/2011   Hypothyroidism 10/18/2011   Chest pain 09/01/2011   Hyperlipidemia 02/02/2009   ATRIAL FIBRILLATION 02/02/2009  PCP: [facility]  REFERRING PROVIDER: Lonni CINDERELLA Poli, MD   REFERRING DIAG: [phone] (ICD-10-CM) - Chronic pain of right knee  THERAPY DIAG:  Chronic pain of right knee  Muscle weakness (generalized)  Other abnormalities of gait and mobility  Rationale for Evaluation and Treatment: Rehabilitation  ONSET DATE: at least one year since the beginning of her pain  SUBJECTIVE:   SUBJECTIVE STATEMENT: Pt reports continued clunking with transfers. R LE feels tight affecting sit to stand also.  PERTINENT HISTORY: Patient has undergone a partial TKA on L knee in 2017 and a full TKA on R knee in 2020. Patient notes that she has been experiencing a  patellar clunk, crepitus, and a feeling of her femur and tibia being stuck on each other in the R knee that began at least one year ago. She recently received a cortisone shot in the R knee that did not assist with her pain levels. She has difficulty with sitting for prolonged periods of time and riding in the car for long periods of time where she begins to experience burning in the knee after 20-30 minutes. Patient endorses recent weight loss and increase in physical activity which she believes may have increased her pain. Patient also mentions that she may have neuropathy in a few toes on her R foot.    PAIN:  Today pain about the same. Are you having pain? Yes: NPRS scale: 0/10 when seated and resting; 7-8/10 at worst  Pain location: entire knee; when caught it feels like the tibia and femur are stuck together   Pain description: burning Aggravating factors: car rides, prolonged sitting in one position Relieving factors: mobility, tylenol    PRECAUTIONS: None  RED FLAGS: None   WEIGHT BEARING RESTRICTIONS: No  FALLS:  Has patient fallen in last 6 months? No  LIVING ENVIRONMENT: Lives with: lives with their spouse Lives in: House/apartment Stairs: Yes: External: 8 going into backyard, 5 into house, 2 into side entrance steps; can reach both Has following equipment at home: Single point cane, Walker - 4 wheeled, Crutches, and Grab bars  OCCUPATION: retired   PLOF: Independent  PATIENT GOALS: improve balance, less painful  NEXT MD VISIT: August   OBJECTIVE:  Note: Objective measures were completed at Evaluation unless otherwise noted.  DIAGNOSTIC FINDINGS: 2 views of the right knee show well-seated total knee arthroplasty with no  complicating features.  There are small areas of calcification at the  superior and inferior poles of the patella component.  There is no  effusion.  The alignment is anatomic.  PATIENT SURVEYS:  PSFS: THE PATIENT SPECIFIC FUNCTIONAL  SCALE  Place score of 0-10 (0 = unable to perform activity and 10 = able to perform activity at the same level as before injury or problem)  Activity Date: 06/06/2024    Sitting for long periods of time  5    2. Riding in the car for long periods of time  4    3. Walking upstairs  5    4. Walking downstairs  5    5. Balance  4    Total Score 4.6      Total Score = Sum of activity scores/number of activities  Minimally Detectable Change: 3 points (for single activity); 2 points (for average score)  [facility] (nd). The Patient Specific Functional Scale . Retrieved from [email or web address]   COGNITION: Overall cognitive status: Within functional limits for tasks assessed     SENSATION: Light touch:  WFL  EDEMA: No edema noted on eval  MUSCLE LENGTH: Not assessed this date   POSTURE: No Significant postural limitations  PALPATION: TTP at R medial joint line and medial femoral condyle; patella has appropriate movement medial<>lateral, however, decreased movement with superior<>inferior movement  LOWER EXTREMITY ROM:  ROM Right Eval 06/06/2024 Left Eval 06/06/2024  Hip flexion A: 110 P: 119 supine A: 112 P: 125 supine  Hip extension    Hip abduction    Hip adduction    Hip internal rotation    Hip external rotation    Knee flexion A: 103 Seated;  Unable to perform PROM 2/2 pain A: 120 Seated; unable to perform PROM 2/2 pain  Knee extension A: 3deg of flexion A: 2deg of flexion  Ankle dorsiflexion    Ankle plantarflexion    Ankle inversion    Ankle eversion     (Blank rows = not tested)  LOWER EXTREMITY MMT:  MMT Right Eval 06/06/2024 Left Eval 06/06/2024  Hip flexion 3+/5 seated 4-/5 seated  Hip extension 3+/5 prone 3/5 prone  Hip abduction 4/5 sidelying 4+5 sidelying  Hip adduction    Hip internal rotation    Hip external rotation    Knee flexion 5/5 seated 5/5 seated  Knee  extension 5/5 seated 5/5 seated  Ankle dorsiflexion    Ankle plantarflexion    Ankle inversion    Ankle eversion     (Blank rows = not tested)  LOWER EXTREMITY SPECIAL TESTS:  None performed on eval  FUNCTIONAL TESTS:  None performed on eval  GAIT: Distance walked: not formally assessed  Assistive device utilized: None Level of assistance: Complete Independence Comments: no overt gait deviation noted upon eval; will need a more in depth evaluation                                                                                                                                 TREATMENT DATE: 06/06/24 06/11/24  There Ex: Recumbent bike SH 3 level 1 resistance  8 min Strap hamstring and ITB stretch on the R LE Standing gastroc stretch on board 3x30 sec Neuro Re ed Mini squats 2x10 Tandem stance /heel to toe 3x20 sec bilaterally Standing marching and hip extension 3x10 bil Manual lower leg stretching with focus on peroneals  PATIENT EDUCATION:  Education details: HEP, daily physical activity, taking mobility breaks during prolonged sitting, hydration Person educated: Patient Education method: Explanation, Demonstration, Verbal cues, and Handouts Education comprehension: verbalized understanding and returned demonstration  HOME EXERCISE PROGRAM: Access Code: [email or web address] URL: [email or web address] Date: 06/06/2024 Prepared by: Susannah Daring  Exercises - Seated Figure 4 Piriformis Stretch  - 1 x daily - 7 x weekly - 3 sets - 30 hold - Mini Squat with Counter Support  - 1 x daily - 7 x weekly - 3 sets - 10 reps - Standing March with Counter Support  - 1 x daily - 7 x weekly - 3  sets - 10 reps - Standing Hip Extension with Counterx Support  - 1 x daily - 7 x weekly - 3 sets - 10  - Hooklying Single Knee to Chest Stretch  - 1 x daily - 7 x weekly - 3 sets - 30 hold  ASSESSMENT:  CLINICAL IMPRESSION: Pt had increased sway with tandem stance but was able to  independently correct herself.  Able to increase time from 20 to 30 seconds with balance.  Needed VC for keeping squats small and initiating with hips posterior.   Eval: Patient is a 78 y.o. F who was seen today for physical therapy evaluation and treatment for chronic R knee pain with mobility deficits, increased pain, and decreased strength leading to functional deficits within the home and community. Patient has undergone a L partial TKA in 2017 and R full TKA in 2020 and she has also received a cortisone shot that she reports did not make any improvements on her pain levels. Patient was educated on importance of hydration especially with increase in physical activity and continued mobility in order to decrease pain in R knee. Patient will benefit from skilled PT to improve above noted deficits.   OBJECTIVE IMPAIRMENTS: decreased balance, decreased mobility, decreased ROM, decreased strength, hypomobility, impaired perceived functional ability, impaired flexibility, and pain.   ACTIVITY LIMITATIONS: bending, squatting, and stairs  PARTICIPATION LIMITATIONS: driving and community activity  PERSONAL FACTORS: Fitness, Time since onset of injury/illness/exacerbation, and 3+ comorbidities: arthritis, hyperlipidemia, GERD, osteopenia are also affecting patient's functional outcome.   REHAB POTENTIAL: Good  CLINICAL DECISION MAKING: Evolving/moderate complexity  EVALUATION COMPLEXITY: Moderate   GOALS: Goals reviewed with patient? Yes  SHORT TERM GOALS: Target date: 06/27/2024 Patient will show compliance with HEP in order to maintain improvements made within PT.  Baseline: Goal status: INITIAL  2.  Patient will report decreased pain levels of </= 4/10 with prolonged sitting in order to improve quality of life with travel.  Baseline:  Goal status: INITIAL   LONG TERM GOALS: Target date: 08/01/2024  Patient will show independence with HEP in order to maintain and progress improvement made  within PT.  Baseline:  Goal status: INITIAL  2.  Patient will report increase in PSFS to at least 6.6 in order to show a significant decrease in perceived disability with functional tasks.  Baseline:  Goal status: INITIAL  3.  Patient will increase R and L hip extension MMT to at least 4/5 to show biomechanical improvements in functional tasks, like squatting.  Baseline:  Goal status: INITIAL  4.  Patient will increase R knee flexion AROM to at least 120 deg in order improve functional tasks.  Baseline:  Goal status: INITIAL  5.  Patient will report decreased pain levels of </= 2/10 with prolonged sitting in order to improve quality of life with travel. Baseline:  Goal status: INITIAL    PLAN:  PT FREQUENCY: 1-2x/week  PT DURATION: 8 weeks  PLANNED INTERVENTIONS: [id number]- PT Re-evaluation, 97750- Physical Performance Testing, 97110-Therapeutic exercises, 97530- Therapeutic activity, [id number]- Neuromuscular re-education, 97535- Self Care, [id number]- Manual therapy, [id number]- Gait training, [phone]- Electrical stimulation (unattended), [id number]- Electrical stimulation (manual), [id number]- Vasopneumatic device, [id number]- Ultrasound, [id number]- Traction (mechanical), [id number]- Ionotophoresis 4mg /ml Dexamethasone , 79439 (1-2 muscles), [id number] (3+ muscles)- Dry Needling, Patient/Family education, Balance training, Stair training, Joint mobilization, Cryotherapy, and Moist heat  PLAN FOR NEXT SESSION: Continue with focus on LE stretching and balance exercises   Burnard Meth, PT 06/11/24  3:58 PM

## 2024-06-20 NOTE — Therapy (Signed)
 OUTPATIENT PHYSICAL THERAPY LOWER EXTREMITY TREATMENT   Patient Name: Frances Smith MRN: [id number] DOB:[birth year], 78 y.o., female Today's Date: 06/21/2024  END OF SESSION:  PT End of Session - 06/21/24 1344     Visit Number 3    Number of Visits 16    Date for PT Re-Evaluation 08/01/24    Authorization Type MEDICARE AND [employer] LIFE    PT Start Time 1345    PT Stop Time 1431    PT Time Calculation (min) 46 min    Activity Tolerance Patient tolerated treatment well    Behavior During Therapy WFL for tasks assessed/performed            Past Medical History:  Diagnosis Date   Anal fissure    Anxiety    Arthritis    Atrial fibrillation (HCC)    Colon polyps    Depression    Diverticulosis    Dysrhythmia    GERD (gastroesophageal reflux disease)    H/O: hysterectomy    Heart murmur    as a child   History of bronchitis as a child    History of head injury    History of measles    History of mumps    Hyperlipidemia    Hypothyroidism    IBS (irritable bowel syndrome)    Menopause    OSA on CPAP    Osteopenia    Paroxysmal atrial fibrillation (HCC)    Restless leg syndrome    Thyroid  disease    Venous insufficiency    right leg   Past Surgical History:  Procedure Laterality Date   ATRIAL FIBRILLATION ABLATION N/A 07/19/2019   Procedure: ATRIAL FIBRILLATION ABLATION;  Surgeon: Kelsie Agent, MD;  Location: [facility];  Service: Cardiovascular;  Laterality: N/A;   BREAST CYST ASPIRATION     multiple but not sure when   BREAST CYST INCISION AND DRAINAGE Left 1985   BREAST SURGERY     aspiration of cyst / left    COLONOSCOPY  2010   ENDOMETRIAL ABLATION     PARTIAL KNEE ARTHROPLASTY Left 08/10/2016   Procedure: LEFT KNEE UNICOMPARTMENTAL ARTHROPLASTY;  Surgeon: Dempsey Moan, MD;  Location: [facility];  Service: Orthopedics;  Laterality: Left;   TONSILLECTOMY AND ADENOIDECTOMY  1949   TOTAL KNEE ARTHROPLASTY Right 07/19/2021   Procedure: TOTAL KNEE  ARTHROPLASTY;  Surgeon: Moan Dempsey, MD;  Location: [facility];  Service: Orthopedics;  Laterality: Right;   TREATMENT FISTULA ANAL  1988   VAGINAL HYSTERECTOMY  1984   Patient Active Problem List   Diagnosis Date Noted   Hypercoagulable state due to paroxysmal atrial fibrillation (HCC) 03/01/2024   Primary osteoarthritis of right knee 07/19/2021   Paroxysmal atrial fibrillation (HCC) 07/19/2019   GERD (gastroesophageal reflux disease) 09/25/2018   Adhesive capsulitis of right shoulder 07/16/2018   Pain in joint of right shoulder 06/18/2018   BMI 34.0-34.9,adult 12/01/2016   Obesity, Class I, BMI 30-34.9 12/01/2016   History of right knee joint replacement 08/31/2016   Bilateral primary osteoarthritis of knee 02/23/2016   Hyperglycemia 10/14/2015   Primary osteoarthritis of knee 08/20/2015   Palpitation 04/16/2015   Primary insomnia 07/24/2014   Osteoporosis 02/27/2013   Generalized anxiety disorder 08/24/2012   Restless leg syndrome 07/30/2012   Knee pain, right 07/13/2012   OSA (obstructive sleep apnea) 07/04/2012   Benign colon polyp 11/25/2011   Osteopenia 11/25/2011   Hypothyroidism 10/18/2011   Chest pain 09/01/2011   Hyperlipidemia 02/02/2009   ATRIAL FIBRILLATION 02/02/2009  PCP: [facility]  REFERRING PROVIDER: Lonni CINDERELLA Poli, MD   REFERRING DIAG: [phone] (ICD-10-CM) - Chronic pain of right knee  THERAPY DIAG:  Chronic pain of right knee  Muscle weakness (generalized)  Other abnormalities of gait and mobility  Rationale for Evaluation and Treatment: Rehabilitation  ONSET DATE: at least one year since the beginning of her pain  SUBJECTIVE:   SUBJECTIVE STATEMENT: Patient reports that she has been completing her HEP and walking. She feels as if the snapping and clicking is almost worse.  PERTINENT HISTORY: Patient has undergone a partial TKA on L knee in 2017 and a full TKA on R knee in 2020. Patient notes that  she has been experiencing a patellar clunk, crepitus, and a feeling of her femur and tibia being stuck on each other in the R knee that began at least one year ago. She recently received a cortisone shot in the R knee that did not assist with her pain levels. She has difficulty with sitting for prolonged periods of time and riding in the car for long periods of time where she begins to experience burning in the knee after 20-30 minutes. Patient endorses recent weight loss and increase in physical activity which she believes may have increased her pain. Patient also mentions that she may have neuropathy in a few toes on her R foot.    PAIN:  Today pain about the same. Are you having pain? Yes: NPRS scale: 0/10 when seated and resting; 7-8/10 at worst  Pain location: entire knee; when caught it feels like the tibia and femur are stuck together   Pain description: burning Aggravating factors: car rides, prolonged sitting in one position Relieving factors: mobility, tylenol    PRECAUTIONS: None  RED FLAGS: None   WEIGHT BEARING RESTRICTIONS: No  FALLS:  Has patient fallen in last 6 months? No  LIVING ENVIRONMENT: Lives with: lives with their spouse Lives in: House/apartment Stairs: Yes: External: 8 going into backyard, 5 into house, 2 into side entrance steps; can reach both Has following equipment at home: Single point cane, Walker - 4 wheeled, Crutches, and Grab bars  OCCUPATION: retired   PLOF: Independent  PATIENT GOALS: improve balance, less painful  NEXT MD VISIT: August   OBJECTIVE:  Note: Objective measures were completed at Evaluation unless otherwise noted.  DIAGNOSTIC FINDINGS: 2 views of the right knee show well-seated total knee arthroplasty with no  complicating features.  There are small areas of calcification at the  superior and inferior poles of the patella component.  There is no  effusion.  The alignment is anatomic.  PATIENT SURVEYS:  PSFS: THE PATIENT  SPECIFIC FUNCTIONAL SCALE  Place score of 0-10 (0 = unable to perform activity and 10 = able to perform activity at the same level as before injury or problem)  Activity Date: 06/06/2024 06/21/2024   Sitting for long periods of time  5 8   2. Riding in the car for long periods of time  4 4   3. Walking upstairs  5 4   4. Walking downstairs  5 4   5. Balance  4 8   Total Score 4.6 5.6     Total Score = Sum of activity scores/number of activities  Minimally Detectable Change: 3 points (for single activity); 2 points (for average score)  [facility] (nd). The Patient Specific Functional Scale . Retrieved from [email or web address]   COGNITION: Overall cognitive status: Within functional limits for tasks assessed  SENSATION: Light touch: WFL  EDEMA: No edema noted on eval  MUSCLE LENGTH: Not assessed this date   POSTURE: No Significant postural limitations  PALPATION: TTP at R medial joint line and medial femoral condyle; patella has appropriate movement medial<>lateral, however, decreased movement with superior<>inferior movement  LOWER EXTREMITY ROM:  ROM Right Eval 06/06/2024 Left Eval 06/06/2024 Right 06/21/2024  Hip flexion A: 110 P: 119 supine A: 112 P: 125 supine A: 112 P: 122 Supine   Hip extension     Hip abduction     Hip adduction     Hip internal rotation     Hip external rotation     Knee flexion A: 103 Seated;  Unable to perform PROM 2/2 pain A: 120 Seated; unable to perform PROM 2/2 pain A: 110deg P: 113deg  Knee extension A: 3deg of flexion A: 2deg of flexion A: 0deg    Ankle dorsiflexion     Ankle plantarflexion     Ankle inversion     Ankle eversion      (Blank rows = not tested)  LOWER EXTREMITY MMT:  MMT Right Eval 06/06/2024 Left Eval 06/06/2024 Right 06/21/2024  Hip flexion 3+/5 seated 4-/5 seated 4-/5  Hip extension 3+/5 prone 3/5 prone 4-/5  Hip abduction 4/5  sidelying 4+5 sidelying 4+/5  Hip adduction     Hip internal rotation     Hip external rotation     Knee flexion 5/5 seated 5/5 seated   Knee extension 5/5 seated 5/5 seated   Ankle dorsiflexion     Ankle plantarflexion     Ankle inversion     Ankle eversion      (Blank rows = not tested)  LOWER EXTREMITY SPECIAL TESTS:  None performed on eval  FUNCTIONAL TESTS:  None performed on eval  GAIT: Distance walked: not formally assessed  Assistive device utilized: None Level of assistance: Complete Independence Comments: no overt gait deviation noted upon eval; will need a more in depth evaluation                                                                                                                                 TREATMENT DATE:  06/21/2024 TherEx:  Recumbent bike seat 4 level 1 8 minutes  Supine figure 4 stretch 3x30s each side  Supine glute bridge with abduction using red TB 3x10 Supine hamstring stretch with strap 2x30s each leg  Supine ITB stretch with strap 2x30s each leg  Bilat leg press 2x10 with 75#, 2x10 with 100#   Physical Performance:  MMT, ROM, PSFS completed for prior to MD visit    06/11/24  There Ex: Recumbent bike SH 3 level 1 resistance  8 min Strap hamstring and ITB stretch on the R LE Standing gastroc stretch on board 3x30 sec Neuro Re ed Mini squats 2x10 Tandem stance /heel to toe 3x20 sec bilaterally Standing marching and hip extension 3x10 bil Manual lower leg stretching  with focus on peroneals  PATIENT EDUCATION:  Education details: HEP, daily physical activity, taking mobility breaks during prolonged sitting, hydration Person educated: Patient Education method: Explanation, Demonstration, Verbal cues, and Handouts Education comprehension: verbalized understanding and returned demonstration  HOME EXERCISE PROGRAM: Access Code: [email or web address] URL: [email or web address] Date: 06/06/2024 Prepared by: Susannah Daring  Exercises - Seated Figure 4 Piriformis Stretch  - 1 x daily - 7 x weekly - 3 sets - 30 hold - Mini Squat with Counter Support  - 1 x daily - 7 x weekly - 3 sets - 10 reps - Standing March with Counter Support  - 1 x daily - 7 x weekly - 3 sets - 10 reps - Standing Hip Extension with Counterx Support  - 1 x daily - 7 x weekly - 3 sets - 10  - Hooklying Single Knee to Chest Stretch  - 1 x daily - 7 x weekly - 3 sets - 30 hold  ASSESSMENT:  CLINICAL IMPRESSION: Patient arrives to session noting that she feels her popping and clicking in R knee is getting worse and feels as if she can not make it more than 15 minutes sitting without the burning sensation. Patient also endorses new onset of numbness of the middle 3 toes in R foot that started about a couple months ago. PT educated on importance of discussing these items with provider at next appointment, June 26, 2024. Patient tolerated all activities well to include leg press; endorsed crunching in R knee with no associated pain. Patient will continue to benefit from skilled PT.   OBJECTIVE IMPAIRMENTS: decreased balance, decreased mobility, decreased ROM, decreased strength, hypomobility, impaired perceived functional ability, impaired flexibility, and pain.   ACTIVITY LIMITATIONS: bending, squatting, and stairs  PARTICIPATION LIMITATIONS: driving and community activity  PERSONAL FACTORS: Fitness, Time since onset of injury/illness/exacerbation, and 3+ comorbidities: arthritis, hyperlipidemia, GERD, osteopenia are also affecting patient's functional outcome.   REHAB POTENTIAL: Good  CLINICAL DECISION MAKING: Evolving/moderate complexity  EVALUATION COMPLEXITY: Moderate   GOALS: Goals reviewed with patient? Yes  SHORT TERM GOALS: Target date: 06/27/2024 Patient will show compliance with HEP in order to maintain improvements made within PT.  Baseline: Goal status: GOAL MET 06/21/2024  2.  Patient will report decreased pain  levels of </= 4/10 with prolonged sitting in order to improve quality of life with travel.  Baseline:  Goal status: ONGOING; patient endorses ability to sit for longer periods of time, but not in the car   LONG TERM GOALS: Target date: 08/01/2024  Patient will show independence with HEP in order to maintain and progress improvement made within PT.  Baseline:  Goal status: INITIAL  2.  Patient will report increase in PSFS to at least 6.6 in order to show a significant decrease in perceived disability with functional tasks.  Baseline:  Goal status: INITIAL  3.  Patient will increase R and L hip extension MMT to at least 4/5 to show biomechanical improvements in functional tasks, like squatting.  Baseline:  Goal status: INITIAL  4.  Patient will increase R knee flexion AROM to at least 120 deg in order improve functional tasks.  Baseline:  Goal status: INITIAL  5.  Patient will report decreased pain levels of </= 2/10 with prolonged sitting in order to improve quality of life with travel. Baseline:  Goal status: INITIAL    PLAN:  PT FREQUENCY: 1-2x/week  PT DURATION: 8 weeks  PLANNED INTERVENTIONS: [id number]- PT Re-evaluation, 97750- Physical Performance Testing, 97110-Therapeutic exercises, 97530- Therapeutic  activity, [id number]- Neuromuscular re-education, [phone]- Self Care, [id number]- Manual therapy, [id number]- Gait training, [phone]- Electrical stimulation (unattended), [phone]- Electrical stimulation (manual), [id number]- Vasopneumatic device, [id number]- Ultrasound, [id number]- Traction (mechanical), [id number]- Ionotophoresis 4mg /ml Dexamethasone , [id number] (1-2 muscles), [id number] (3+ muscles)- Dry Needling, Patient/Family education, Balance training, Stair training, Joint mobilization, Cryotherapy, and Moist heat  PLAN FOR NEXT SESSION: Continue with focus on LE stretching and balance exercises, hip flexibility, nerve assessment and glides (if necessary)    Susannah Daring, PT, DPT 06/21/24 2:43 PM

## 2024-06-21 ENCOUNTER — Ambulatory Visit

## 2024-06-21 DIAGNOSIS — R2689 Other abnormalities of gait and mobility: Secondary | ICD-10-CM | POA: Diagnosis not present

## 2024-06-21 DIAGNOSIS — M6281 Muscle weakness (generalized): Secondary | ICD-10-CM | POA: Diagnosis not present

## 2024-06-21 DIAGNOSIS — M25561 Pain in right knee: Secondary | ICD-10-CM | POA: Diagnosis not present

## 2024-06-21 DIAGNOSIS — G8929 Other chronic pain: Secondary | ICD-10-CM | POA: Diagnosis not present

## 2024-06-26 ENCOUNTER — Ambulatory Visit (INDEPENDENT_AMBULATORY_CARE_PROVIDER_SITE_OTHER): Admitting: Orthopaedic Surgery

## 2024-06-26 ENCOUNTER — Encounter: Payer: Self-pay | Admitting: Orthopaedic Surgery

## 2024-06-26 DIAGNOSIS — Z96651 Presence of right artificial knee joint: Secondary | ICD-10-CM | POA: Diagnosis not present

## 2024-06-26 DIAGNOSIS — G8929 Other chronic pain: Secondary | ICD-10-CM | POA: Diagnosis not present

## 2024-06-26 DIAGNOSIS — M25561 Pain in right knee: Secondary | ICD-10-CM | POA: Diagnosis not present

## 2024-06-26 NOTE — Progress Notes (Signed)
 The patient is an 78 year old female who is here in follow-up as a relates to patella clunk of her right total knee arthroplasty that was done by 1 my colleagues in town about 3 years ago.  She says therapy has helped in terms of her stretching and becoming more limber but she still gets the popping sensation on a regular basis with her right knee.  She is traveling extensively in September.  She is not interested in surgical intervention.  Not sure that an arthroscopic intervention would be warranted is much as an open arthrotomy with a lysis of adhesions and tightening up the medial structures with a releasing laterally and even upsizing the poly if that would help.  An arthroscopic intervention could be somewhat helpful but I would send her to one of our partners to consider that.  I do feel a knee sleeve would be worthwhile.  On examination the patella seems to track centrally but there is a lot of clunking in popping in the knee with flexion and extension.  She even demonstrated that easily when she squatted down while standing.  Her hip seems to move smoothly.  She feels like there is some stiffness there.  I have recommended a knee sleeve to try and obtain this will give her some support while she is traveling.  If she ends up wanting to consider surgical intervention she will come back and see us  and I can either refer her to one of my orthopedic colleagues who performs arthroscopic releases to see what their opinion is or consider an open arthrotomy like we described before.

## 2024-06-27 NOTE — Therapy (Signed)
 OUTPATIENT PHYSICAL THERAPY LOWER EXTREMITY TREATMENT   Patient Name: Frances Smith MRN: [id number] DOB:Oct 11, 1946, 78 y.o., female Today's Date: 06/28/2024  END OF SESSION:  PT End of Session - 06/28/24 1345     Visit Number 4    Number of Visits 16    Date for PT Re-Evaluation 08/01/24    Authorization Type MEDICARE AND [employer] LIFE    PT Start Time 1345    PT Stop Time 1428    PT Time Calculation (min) 43 min    Activity Tolerance Patient tolerated treatment well    Behavior During Therapy WFL for tasks assessed/performed             Past Medical History:  Diagnosis Date   Anal fissure    Anxiety    Arthritis    Atrial fibrillation (HCC)    Colon polyps    Depression    Diverticulosis    Dysrhythmia    GERD (gastroesophageal reflux disease)    H/O: hysterectomy    Heart murmur    as a child   History of bronchitis as a child    History of head injury    History of measles    History of mumps    Hyperlipidemia    Hypothyroidism    IBS (irritable bowel syndrome)    Menopause    OSA on CPAP    Osteopenia    Paroxysmal atrial fibrillation (HCC)    Restless leg syndrome    Thyroid  disease    Venous insufficiency    right leg   Past Surgical History:  Procedure Laterality Date   ATRIAL FIBRILLATION ABLATION N/A 07/19/2019   Procedure: ATRIAL FIBRILLATION ABLATION;  Surgeon: Kelsie Agent, MD;  Location: [facility];  Service: Cardiovascular;  Laterality: N/A;   BREAST CYST ASPIRATION     multiple but not sure when   BREAST CYST INCISION AND DRAINAGE Left 1985   BREAST SURGERY     aspiration of cyst / left    COLONOSCOPY  2010   ENDOMETRIAL ABLATION     PARTIAL KNEE ARTHROPLASTY Left 08/10/2016   Procedure: LEFT KNEE UNICOMPARTMENTAL ARTHROPLASTY;  Surgeon: Dempsey Moan, MD;  Location: [facility];  Service: Orthopedics;  Laterality: Left;   TONSILLECTOMY AND ADENOIDECTOMY  1949   TOTAL KNEE ARTHROPLASTY Right 07/19/2021   Procedure: TOTAL KNEE  ARTHROPLASTY;  Surgeon: Moan Dempsey, MD;  Location: [facility];  Service: Orthopedics;  Laterality: Right;   TREATMENT FISTULA ANAL  1988   VAGINAL HYSTERECTOMY  1984   Patient Active Problem List   Diagnosis Date Noted   Hypercoagulable state due to paroxysmal atrial fibrillation (HCC) 03/01/2024   Primary osteoarthritis of right knee 07/19/2021   Paroxysmal atrial fibrillation (HCC) 07/19/2019   GERD (gastroesophageal reflux disease) 09/25/2018   Adhesive capsulitis of right shoulder 07/16/2018   Pain in joint of right shoulder 06/18/2018   BMI 34.0-34.9,adult 12/01/2016   Obesity, Class I, BMI 30-34.9 12/01/2016   History of right knee joint replacement 08/31/2016   Bilateral primary osteoarthritis of knee 02/23/2016   Hyperglycemia 10/14/2015   Primary osteoarthritis of knee 08/20/2015   Palpitation 04/16/2015   Primary insomnia 07/24/2014   Osteoporosis 02/27/2013   Generalized anxiety disorder 08/24/2012   Restless leg syndrome 07/30/2012   Knee pain, right 07/13/2012   OSA (obstructive sleep apnea) 07/04/2012   Benign colon polyp 11/25/2011   Osteopenia 11/25/2011   Hypothyroidism 10/18/2011   Chest pain 09/01/2011   Hyperlipidemia 02/02/2009   ATRIAL FIBRILLATION  02/02/2009    PCP: [facility]  REFERRING PROVIDER: Lonni CINDERELLA Poli, MD   REFERRING DIAG: [phone] (ICD-10-CM) - Chronic pain of right knee  THERAPY DIAG:  Chronic pain of right knee  Muscle weakness (generalized)  Other abnormalities of gait and mobility  Rationale for Evaluation and Treatment: Rehabilitation  ONSET DATE: at least one year since the beginning of her pain  SUBJECTIVE:   SUBJECTIVE STATEMENT: Patient arrives feeling slightly discouraged after her follow up with provider, though feels encouraged as PT has been helping her improve.  PERTINENT HISTORY: Patient has undergone a partial TKA on L knee in 2017 and a full TKA on R knee in 2020.  Patient notes that she has been experiencing a patellar clunk, crepitus, and a feeling of her femur and tibia being stuck on each other in the R knee that began at least one year ago. She recently received a cortisone shot in the R knee that did not assist with her pain levels. She has difficulty with sitting for prolonged periods of time and riding in the car for long periods of time where she begins to experience burning in the knee after 20-30 minutes. Patient endorses recent weight loss and increase in physical activity which she believes may have increased her pain. Patient also mentions that she may have neuropathy in a few toes on her R foot.    PAIN:  Today pain about the same. Are you having pain? Yes: NPRS scale: 0/10 when seated and resting; 7-8/10 at worst  Pain location: entire knee; when caught it feels like the tibia and femur are stuck together   Pain description: burning Aggravating factors: car rides, prolonged sitting in one position Relieving factors: mobility, tylenol    PRECAUTIONS: None  RED FLAGS: None   WEIGHT BEARING RESTRICTIONS: No  FALLS:  Has patient fallen in last 6 months? No  LIVING ENVIRONMENT: Lives with: lives with their spouse Lives in: House/apartment Stairs: Yes: External: 8 going into backyard, 5 into house, 2 into side entrance steps; can reach both Has following equipment at home: Single point cane, Walker - 4 wheeled, Crutches, and Grab bars  OCCUPATION: retired   PLOF: Independent  PATIENT GOALS: improve balance, less painful  NEXT MD VISIT: August   OBJECTIVE:  Note: Objective measures were completed at Evaluation unless otherwise noted.  DIAGNOSTIC FINDINGS: 2 views of the right knee show well-seated total knee arthroplasty with no  complicating features.  There are small areas of calcification at the  superior and inferior poles of the patella component.  There is no  effusion.  The alignment is anatomic.  PATIENT SURVEYS:   PSFS: THE PATIENT SPECIFIC FUNCTIONAL SCALE  Place score of 0-10 (0 = unable to perform activity and 10 = able to perform activity at the same level as before injury or problem)  Activity Date: 06/06/2024 06/21/2024   Sitting for long periods of time  5 8   2. Riding in the car for long periods of time  4 4   3. Walking upstairs  5 4   4. Walking downstairs  5 4   5. Balance  4 8   Total Score 4.6 5.6     Total Score = Sum of activity scores/number of activities  Minimally Detectable Change: 3 points (for single activity); 2 points (for average score)  [facility] (nd). The Patient Specific Functional Scale . Retrieved from [email or web address]   COGNITION: Overall cognitive status: Within functional limits  for tasks assessed     SENSATION: Light touch: WFL  EDEMA: No edema noted on eval  MUSCLE LENGTH: Not assessed this date   POSTURE: No Significant postural limitations  PALPATION: TTP at R medial joint line and medial femoral condyle; patella has appropriate movement medial<>lateral, however, decreased movement with superior<>inferior movement  LOWER EXTREMITY ROM:  ROM Right Eval 06/06/2024 Left Eval 06/06/2024 Right 06/21/2024  Hip flexion A: 110 P: 119 supine A: 112 P: 125 supine A: 112 P: 122 Supine   Hip extension     Hip abduction     Hip adduction     Hip internal rotation     Hip external rotation     Knee flexion A: 103 Seated;  Unable to perform PROM 2/2 pain A: 120 Seated; unable to perform PROM 2/2 pain A: 110deg P: 113deg  Knee extension A: 3deg of flexion A: 2deg of flexion A: 0deg    Ankle dorsiflexion     Ankle plantarflexion     Ankle inversion     Ankle eversion      (Blank rows = not tested)  LOWER EXTREMITY MMT:  MMT Right Eval 06/06/2024 Left Eval 06/06/2024 Right 06/21/2024  Hip flexion 3+/5 seated 4-/5 seated 4-/5  Hip extension 3+/5 prone 3/5 prone 4-/5   Hip abduction 4/5 sidelying 4+5 sidelying 4+/5  Hip adduction     Hip internal rotation     Hip external rotation     Knee flexion 5/5 seated 5/5 seated   Knee extension 5/5 seated 5/5 seated   Ankle dorsiflexion     Ankle plantarflexion     Ankle inversion     Ankle eversion      (Blank rows = not tested)  LOWER EXTREMITY SPECIAL TESTS:  None performed on eval  FUNCTIONAL TESTS:  None performed on eval  GAIT: Distance walked: not formally assessed  Assistive device utilized: None Level of assistance: Complete Independence Comments: no overt gait deviation noted upon eval; will need a more in depth evaluation                                                                                                                                 TREATMENT DATE:  06/28/2024 TherEx:  Recumbent bike seat 4 level 1 8 minutes (to be increased next session) Slant board gastroc stretch 3x30s  Lateral walks with red TB 3x10 each direction  RDLs for glute strength 1x3 with 5# KB (too easy), 2x8 with 10# KB   Neuro Re-Ed:  Step up and over 6 step leading with R going up and L going down, 1x10  Assessment of stairs; 2x ascending and descending lobby stairs  PT discussed future plans of improving endurance and motor control of Rt LE Forward tap downs from 6 step to improve eccentric control of Rt LE  Single leg taps to light up pods 3x30s with 4 in front, 3x30s with 2 in front  and 2 behind in a square    06/21/2024 TherEx:  Recumbent bike seat 4 level 1 8 minutes  Supine figure 4 stretch 3x30s each side  Supine glute bridge with abduction using red TB 3x10 Supine hamstring stretch with strap 2x30s each leg  Supine ITB stretch with strap 2x30s each leg  Bilat leg press 2x10 with 75#, 2x10 with 100#   Physical Performance:  MMT, ROM, PSFS completed for prior to MD visit    06/11/24  There Ex: Recumbent bike SH 3 level 1 resistance  8 min Strap hamstring and ITB stretch on the R  LE Standing gastroc stretch on board 3x30 sec Neuro Re ed Mini squats 2x10 Tandem stance /heel to toe 3x20 sec bilaterally Standing marching and hip extension 3x10 bil Manual lower leg stretching with focus on peroneals  PATIENT EDUCATION:  Education details: HEP, daily physical activity, taking mobility breaks during prolonged sitting, hydration Person educated: Patient Education method: Explanation, Demonstration, Verbal cues, and Handouts Education comprehension: verbalized understanding and returned demonstration  HOME EXERCISE PROGRAM: Access Code: [email or web address] URL: [email or web address] Date: 06/06/2024 Prepared by: Susannah Daring  Exercises - Seated Figure 4 Piriformis Stretch  - 1 x daily - 7 x weekly - 3 sets - 30 hold - Mini Squat with Counter Support  - 1 x daily - 7 x weekly - 3 sets - 10 reps - Standing March with Counter Support  - 1 x daily - 7 x weekly - 3 sets - 10 reps - Standing Hip Extension with Counterx Support  - 1 x daily - 7 x weekly - 3 sets - 10  - Hooklying Single Knee to Chest Stretch  - 1 x daily - 7 x weekly - 3 sets - 30 hold  ASSESSMENT:  CLINICAL IMPRESSION: Patient arriving to session with no pain or discomfort, though did experience minimal discomfort when beginning her forward revolutions on the recumbent bike. Patient tolerated all increased challenges with activity today and will benefit from increased focus on endurance and motor control of eccentric exercises. Patient will continue to benefit from skilled PT.     OBJECTIVE IMPAIRMENTS: decreased balance, decreased mobility, decreased ROM, decreased strength, hypomobility, impaired perceived functional ability, impaired flexibility, and pain.   ACTIVITY LIMITATIONS: bending, squatting, and stairs  PARTICIPATION LIMITATIONS: driving and community activity  PERSONAL FACTORS: Fitness, Time since onset of injury/illness/exacerbation, and 3+ comorbidities: arthritis, hyperlipidemia,  GERD, osteopenia are also affecting patient's functional outcome.   REHAB POTENTIAL: Good  CLINICAL DECISION MAKING: Evolving/moderate complexity  EVALUATION COMPLEXITY: Moderate   GOALS: Goals reviewed with patient? Yes  SHORT TERM GOALS: Target date: 06/27/2024 Patient will show compliance with HEP in order to maintain improvements made within PT.  Baseline: Goal status: GOAL MET 06/21/2024  2.  Patient will report decreased pain levels of </= 4/10 with prolonged sitting in order to improve quality of life with travel.  Baseline:  Goal status: ONGOING; patient endorses ability to sit for longer periods of time, but not in the car   LONG TERM GOALS: Target date: 08/01/2024  Patient will show independence with HEP in order to maintain and progress improvement made within PT.  Baseline:  Goal status: INITIAL  2.  Patient will report increase in PSFS to at least 6.6 in order to show a significant decrease in perceived disability with functional tasks.  Baseline:  Goal status: INITIAL  3.  Patient will increase R and L hip extension MMT to at least 4/5 to show biomechanical improvements in  functional tasks, like squatting.  Baseline:  Goal status: INITIAL  4.  Patient will increase R knee flexion AROM to at least 120 deg in order improve functional tasks.  Baseline:  Goal status: INITIAL  5.  Patient will report decreased pain levels of </= 2/10 with prolonged sitting in order to improve quality of life with travel. Baseline:  Goal status: INITIAL    PLAN:  PT FREQUENCY: 1-2x/week  PT DURATION: 8 weeks  PLANNED INTERVENTIONS: [id number]- PT Re-evaluation, 97750- Physical Performance Testing, 97110-Therapeutic exercises, 97530- Therapeutic activity, [id number]- Neuromuscular re-education, 97535- Self Care, [id number]- Manual therapy, [id number]- Gait training, [phone]- Electrical stimulation (unattended), [phone]- Electrical stimulation (manual), [id number]- Vasopneumatic device, [id number]- Ultrasound,  [id number]- Traction (mechanical), [id number]- Ionotophoresis 4mg /ml Dexamethasone , [id number] (1-2 muscles), [id number] (3+ muscles)- Dry Needling, Patient/Family education, Balance training, Stair training, Joint mobilization, Cryotherapy, and Moist heat  PLAN FOR NEXT SESSION: Continue with focus on LE stretching and balance exercises, hip flexibility, eccentric LE and hip strengthening    Susannah Daring, PT, DPT 06/28/24 2:44 PM

## 2024-06-28 ENCOUNTER — Ambulatory Visit

## 2024-06-28 DIAGNOSIS — M6281 Muscle weakness (generalized): Secondary | ICD-10-CM

## 2024-06-28 DIAGNOSIS — G8929 Other chronic pain: Secondary | ICD-10-CM

## 2024-06-28 DIAGNOSIS — R2689 Other abnormalities of gait and mobility: Secondary | ICD-10-CM

## 2024-06-28 DIAGNOSIS — M25561 Pain in right knee: Secondary | ICD-10-CM

## 2024-07-01 ENCOUNTER — Ambulatory Visit (HOSPITAL_COMMUNITY): Admitting: Physician Assistant

## 2024-07-04 NOTE — Therapy (Signed)
 OUTPATIENT PHYSICAL THERAPY LOWER EXTREMITY TREATMENT   Patient Name: DESHONDA CRYDERMAN MRN: [id number] DOB:03/12/1946, 78 y.o., female Today's Date: 07/05/2024  END OF SESSION:  PT End of Session - 07/05/24 1351     Visit Number 5    Number of Visits 16    Date for PT Re-Evaluation 08/01/24    Authorization Type MEDICARE AND [employer] LIFE    PT Start Time 1349    PT Stop Time 1427    PT Time Calculation (min) 38 min    Activity Tolerance Patient tolerated treatment well    Behavior During Therapy WFL for tasks assessed/performed              Past Medical History:  Diagnosis Date   Anal fissure    Anxiety    Arthritis    Atrial fibrillation (HCC)    Colon polyps    Depression    Diverticulosis    Dysrhythmia    GERD (gastroesophageal reflux disease)    H/O: hysterectomy    Heart murmur    as a child   History of bronchitis as a child    History of head injury    History of measles    History of mumps    Hyperlipidemia    Hypothyroidism    IBS (irritable bowel syndrome)    Menopause    OSA on CPAP    Osteopenia    Paroxysmal atrial fibrillation (HCC)    Restless leg syndrome    Thyroid  disease    Venous insufficiency    right leg   Past Surgical History:  Procedure Laterality Date   ATRIAL FIBRILLATION ABLATION N/A 07/19/2019   Procedure: ATRIAL FIBRILLATION ABLATION;  Surgeon: Kelsie Agent, MD;  Location: [facility];  Service: Cardiovascular;  Laterality: N/A;   BREAST CYST ASPIRATION     multiple but not sure when   BREAST CYST INCISION AND DRAINAGE Left 1985   BREAST SURGERY     aspiration of cyst / left    COLONOSCOPY  2010   ENDOMETRIAL ABLATION     PARTIAL KNEE ARTHROPLASTY Left 08/10/2016   Procedure: LEFT KNEE UNICOMPARTMENTAL ARTHROPLASTY;  Surgeon: Dempsey Moan, MD;  Location: [facility];  Service: Orthopedics;  Laterality: Left;   TONSILLECTOMY AND ADENOIDECTOMY  1949   TOTAL KNEE ARTHROPLASTY Right 07/19/2021   Procedure: TOTAL  KNEE ARTHROPLASTY;  Surgeon: Moan Dempsey, MD;  Location: [facility];  Service: Orthopedics;  Laterality: Right;   TREATMENT FISTULA ANAL  1988   VAGINAL HYSTERECTOMY  1984   Patient Active Problem List   Diagnosis Date Noted   Hypercoagulable state due to paroxysmal atrial fibrillation (HCC) 03/01/2024   Primary osteoarthritis of right knee 07/19/2021   Paroxysmal atrial fibrillation (HCC) 07/19/2019   GERD (gastroesophageal reflux disease) 09/25/2018   Adhesive capsulitis of right shoulder 07/16/2018   Pain in joint of right shoulder 06/18/2018   BMI 34.0-34.9,adult 12/01/2016   Obesity, Class I, BMI 30-34.9 12/01/2016   History of right knee joint replacement 08/31/2016   Bilateral primary osteoarthritis of knee 02/23/2016   Hyperglycemia 10/14/2015   Primary osteoarthritis of knee 08/20/2015   Palpitation 04/16/2015   Primary insomnia 07/24/2014   Osteoporosis 02/27/2013   Generalized anxiety disorder 08/24/2012   Restless leg syndrome 07/30/2012   Knee pain, right 07/13/2012   OSA (obstructive sleep apnea) 07/04/2012   Benign colon polyp 11/25/2011   Osteopenia 11/25/2011   Hypothyroidism 10/18/2011   Chest pain 09/01/2011   Hyperlipidemia 02/02/2009   ATRIAL  FIBRILLATION 02/02/2009    PCP: [facility]  REFERRING PROVIDER: Lonni CINDERELLA Poli, MD   REFERRING DIAG: [phone] (ICD-10-CM) - Chronic pain of right knee  THERAPY DIAG:  Chronic pain of right knee  Muscle weakness (generalized)  Other abnormalities of gait and mobility  Rationale for Evaluation and Treatment: Rehabilitation  ONSET DATE: at least one year since the beginning of her pain  SUBJECTIVE:   SUBJECTIVE STATEMENT: Patient endorsing no pain this session.   PERTINENT HISTORY: Patient has undergone a partial TKA on L knee in 2017 and a full TKA on R knee in 2020. Patient notes that she has been experiencing a patellar clunk, crepitus, and a feeling of her  femur and tibia being stuck on each other in the R knee that began at least one year ago. She recently received a cortisone shot in the R knee that did not assist with her pain levels. She has difficulty with sitting for prolonged periods of time and riding in the car for long periods of time where she begins to experience burning in the knee after 20-30 minutes. Patient endorses recent weight loss and increase in physical activity which she believes may have increased her pain. Patient also mentions that she may have neuropathy in a few toes on her R foot.    PAIN:  Today pain about the same. Are you having pain? Yes: NPRS scale: 0/10 when seated and resting; 7-8/10 at worst  Pain location: entire knee; when caught it feels like the tibia and femur are stuck together   Pain description: burning Aggravating factors: car rides, prolonged sitting in one position Relieving factors: mobility, tylenol    PRECAUTIONS: None  RED FLAGS: None   WEIGHT BEARING RESTRICTIONS: No  FALLS:  Has patient fallen in last 6 months? No  LIVING ENVIRONMENT: Lives with: lives with their spouse Lives in: House/apartment Stairs: Yes: External: 8 going into backyard, 5 into house, 2 into side entrance steps; can reach both Has following equipment at home: Single point cane, Walker - 4 wheeled, Crutches, and Grab bars  OCCUPATION: retired   PLOF: Independent  PATIENT GOALS: improve balance, less painful  NEXT MD VISIT: August   OBJECTIVE:  Note: Objective measures were completed at Evaluation unless otherwise noted.  DIAGNOSTIC FINDINGS: 2 views of the right knee show well-seated total knee arthroplasty with no  complicating features.  There are small areas of calcification at the  superior and inferior poles of the patella component.  There is no  effusion.  The alignment is anatomic.  PATIENT SURVEYS:  PSFS: THE PATIENT SPECIFIC FUNCTIONAL SCALE  Place score of 0-10 (0 = unable to perform  activity and 10 = able to perform activity at the same level as before injury or problem)  Activity Date: 06/06/2024 06/21/2024   Sitting for long periods of time  5 8   2. Riding in the car for long periods of time  4 4   3. Walking upstairs  5 4   4. Walking downstairs  5 4   5. Balance  4 8   Total Score 4.6 5.6     Total Score = Sum of activity scores/number of activities  Minimally Detectable Change: 3 points (for single activity); 2 points (for average score)  [facility] (nd). The Patient Specific Functional Scale . Retrieved from [email or web address]   COGNITION: Overall cognitive status: Within functional limits for tasks assessed     SENSATION: Light touch: WFL  EDEMA:  No edema noted on eval  MUSCLE LENGTH: Not assessed this date   POSTURE: No Significant postural limitations  PALPATION: TTP at R medial joint line and medial femoral condyle; patella has appropriate movement medial<>lateral, however, decreased movement with superior<>inferior movement  LOWER EXTREMITY ROM:  ROM Right Eval 06/06/2024 Left Eval 06/06/2024 Right 06/21/2024  Hip flexion A: 110 P: 119 supine A: 112 P: 125 supine A: 112 P: 122 Supine   Hip extension     Hip abduction     Hip adduction     Hip internal rotation     Hip external rotation     Knee flexion A: 103 Seated;  Unable to perform PROM 2/2 pain A: 120 Seated; unable to perform PROM 2/2 pain A: 110deg P: 113deg  Knee extension A: 3deg of flexion A: 2deg of flexion A: 0deg    Ankle dorsiflexion     Ankle plantarflexion     Ankle inversion     Ankle eversion      (Blank rows = not tested)  LOWER EXTREMITY MMT:  MMT Right Eval 06/06/2024 Left Eval 06/06/2024 Right 06/21/2024  Hip flexion 3+/5 seated 4-/5 seated 4-/5  Hip extension 3+/5 prone 3/5 prone 4-/5  Hip abduction 4/5 sidelying 4+5 sidelying 4+/5  Hip adduction     Hip internal rotation      Hip external rotation     Knee flexion 5/5 seated 5/5 seated   Knee extension 5/5 seated 5/5 seated   Ankle dorsiflexion     Ankle plantarflexion     Ankle inversion     Ankle eversion      (Blank rows = not tested)  LOWER EXTREMITY SPECIAL TESTS:  None performed on eval  FUNCTIONAL TESTS:  None performed on eval  GAIT: Distance walked: not formally assessed  Assistive device utilized: None Level of assistance: Complete Independence Comments: no overt gait deviation noted upon eval; will need a more in depth evaluation                                                                                                                                 TREATMENT DATE:  07/05/2024 TherEx:  Nustep seat 5, level 3, 9 minutes  Lateral step ups with 6 step 2x10 each leg intermittently using UE support for balance control Discussed plan of care and update to HEP for transition to discharge after last two episodes   Neuro Re-Ed: Step up and over 6 step 1x10 (rated: easy) Tap down from 6 step 2x10 each leg intermittently using UE support for balance control Tandem stance while tossing and catching ball 3x20s each leg  Discussion of vertigo and Epley maneuver    06/28/2024 TherEx:  Recumbent bike seat 4 level 1 8 minutes (to be increased next session) Slant board gastroc stretch 3x30s  Lateral walks with red TB 3x10 each direction  RDLs for glute strength 1x3 with 5# KB (too easy), 2x8 with  10# KB   Neuro Re-Ed:  Step up and over 6 step leading with R going up and L going down, 1x10  Assessment of stairs; 2x ascending and descending lobby stairs  PT discussed future plans of improving endurance and motor control of Rt LE Forward tap downs from 6 step to improve eccentric control of Rt LE  Single leg taps to light up pods 3x30s with 4 in front, 3x30s with 2 in front and 2 behind in a square    06/21/2024 TherEx:  Recumbent bike seat 4 level 1 8 minutes  Supine figure 4  stretch 3x30s each side  Supine glute bridge with abduction using red TB 3x10 Supine hamstring stretch with strap 2x30s each leg  Supine ITB stretch with strap 2x30s each leg  Bilat leg press 2x10 with 75#, 2x10 with 100#   Physical Performance:  MMT, ROM, PSFS completed for prior to MD visit    06/11/24  There Ex: Recumbent bike SH 3 level 1 resistance  8 min Strap hamstring and ITB stretch on the R LE Standing gastroc stretch on board 3x30 sec Neuro Re ed Mini squats 2x10 Tandem stance /heel to toe 3x20 sec bilaterally Standing marching and hip extension 3x10 bil Manual lower leg stretching with focus on peroneals  PATIENT EDUCATION:  Education details: HEP, daily physical activity, taking mobility breaks during prolonged sitting, hydration Person educated: Patient Education method: Explanation, Demonstration, Verbal cues, and Handouts Education comprehension: verbalized understanding and returned demonstration  HOME EXERCISE PROGRAM: Access Code: [email or web address] URL: [email or web address] Date: 06/06/2024 Prepared by: Susannah Daring  Exercises - Seated Figure 4 Piriformis Stretch  - 1 x daily - 7 x weekly - 3 sets - 30 hold - Mini Squat with Counter Support  - 1 x daily - 7 x weekly - 3 sets - 10 reps - Standing March with Counter Support  - 1 x daily - 7 x weekly - 3 sets - 10 reps - Standing Hip Extension with Counterx Support  - 1 x daily - 7 x weekly - 3 sets - 10  - Hooklying Single Knee to Chest Stretch  - 1 x daily - 7 x weekly - 3 sets - 30 hold  ASSESSMENT:  CLINICAL IMPRESSION: Patient arrived to session with no symptoms in knee, however, noted having vertigo during the week. Patient has experienced vertigo in the past and is on medication that could increase risk of vertigo, and she did not have any symptoms throughout session. Patient tolerated all activities well this date and acknowledges and agrees that she may not need more PT after the next two  sessions. Patient will continue to benefit from skilled PT at this time.   OBJECTIVE IMPAIRMENTS: decreased balance, decreased mobility, decreased ROM, decreased strength, hypomobility, impaired perceived functional ability, impaired flexibility, and pain.   ACTIVITY LIMITATIONS: bending, squatting, and stairs  PARTICIPATION LIMITATIONS: driving and community activity  PERSONAL FACTORS: Fitness, Time since onset of injury/illness/exacerbation, and 3+ comorbidities: arthritis, hyperlipidemia, GERD, osteopenia are also affecting patient's functional outcome.   REHAB POTENTIAL: Good  CLINICAL DECISION MAKING: Evolving/moderate complexity  EVALUATION COMPLEXITY: Moderate   GOALS: Goals reviewed with patient? Yes  SHORT TERM GOALS: Target date: 06/27/2024 Patient will show compliance with HEP in order to maintain improvements made within PT.  Baseline: Goal status: GOAL MET 06/21/2024  2.  Patient will report decreased pain levels of </= 4/10 with prolonged sitting in order to improve quality of life with travel.  Baseline:  Goal status: ONGOING;  patient endorses ability to sit for longer periods of time, but not in the car   LONG TERM GOALS: Target date: 08/01/2024  Patient will show independence with HEP in order to maintain and progress improvement made within PT.  Baseline:  Goal status: INITIAL  2.  Patient will report increase in PSFS to at least 6.6 in order to show a significant decrease in perceived disability with functional tasks.  Baseline:  Goal status: INITIAL  3.  Patient will increase R and L hip extension MMT to at least 4/5 to show biomechanical improvements in functional tasks, like squatting.  Baseline:  Goal status: INITIAL  4.  Patient will increase R knee flexion AROM to at least 120 deg in order improve functional tasks.  Baseline:  Goal status: INITIAL  5.  Patient will report decreased pain levels of </= 2/10 with prolonged sitting in order to improve  quality of life with travel. Baseline:  Goal status: INITIAL    PLAN:  PT FREQUENCY: 1-2x/week  PT DURATION: 8 weeks  PLANNED INTERVENTIONS: 97164- PT Re-evaluation, 97750- Physical Performance Testing, 97110-Therapeutic exercises, 97530- Therapeutic activity, [id number]- Neuromuscular re-education, 97535- Self Care, [id number]- Manual therapy, [id number]- Gait training, [phone]- Electrical stimulation (unattended), [phone]- Electrical stimulation (manual), [id number]- Vasopneumatic device, [id number]- Ultrasound, [id number]- Traction (mechanical), [id number]- Ionotophoresis 4mg /ml Dexamethasone , 79439 (1-2 muscles), [id number] (3+ muscles)- Dry Needling, Patient/Family education, Balance training, Stair training, Joint mobilization, Cryotherapy, and Moist heat  PLAN FOR NEXT SESSION: Epley (talk to stephanie?), update HEP, Continue with focus on LE stretching and balance exercises, hip flexibility, eccentric LE and hip strengthening    Susannah Daring, PT, DPT 07/05/24 2:35 PM

## 2024-07-05 ENCOUNTER — Ambulatory Visit (INDEPENDENT_AMBULATORY_CARE_PROVIDER_SITE_OTHER)

## 2024-07-05 DIAGNOSIS — R2689 Other abnormalities of gait and mobility: Secondary | ICD-10-CM

## 2024-07-05 DIAGNOSIS — M6281 Muscle weakness (generalized): Secondary | ICD-10-CM | POA: Diagnosis not present

## 2024-07-05 DIAGNOSIS — M25561 Pain in right knee: Secondary | ICD-10-CM | POA: Diagnosis not present

## 2024-07-05 DIAGNOSIS — G8929 Other chronic pain: Secondary | ICD-10-CM

## 2024-07-11 NOTE — Therapy (Signed)
 OUTPATIENT PHYSICAL THERAPY LOWER EXTREMITY TREATMENT   Patient Name: Frances Smith MRN: [id number] DOB:[birth year], 78 y.o., female Today's Date: 07/12/2024  END OF SESSION:  PT End of Session - 07/12/24 1350     Visit Number 6    Number of Visits 16    Date for PT Re-Evaluation 08/01/24    Authorization Type MEDICARE AND [employer] LIFE    PT Start Time 1348    PT Stop Time 1433    PT Time Calculation (min) 45 min    Activity Tolerance Patient tolerated treatment well    Behavior During Therapy WFL for tasks assessed/performed            Past Medical History:  Diagnosis Date   Anal fissure    Anxiety    Arthritis    Atrial fibrillation (HCC)    Colon polyps    Depression    Diverticulosis    Dysrhythmia    GERD (gastroesophageal reflux disease)    H/O: hysterectomy    Heart murmur    as a child   History of bronchitis as a child    History of head injury    History of measles    History of mumps    Hyperlipidemia    Hypothyroidism    IBS (irritable bowel syndrome)    Menopause    OSA on CPAP    Osteopenia    Paroxysmal atrial fibrillation (HCC)    Restless leg syndrome    Thyroid  disease    Venous insufficiency    right leg   Past Surgical History:  Procedure Laterality Date   ATRIAL FIBRILLATION ABLATION N/A 07/19/2019   Procedure: ATRIAL FIBRILLATION ABLATION;  Surgeon: Kelsie Agent, MD;  Location: [facility];  Service: Cardiovascular;  Laterality: N/A;   BREAST CYST ASPIRATION     multiple but not sure when   BREAST CYST INCISION AND DRAINAGE Left 1985   BREAST SURGERY     aspiration of cyst / left    COLONOSCOPY  2010   ENDOMETRIAL ABLATION     PARTIAL KNEE ARTHROPLASTY Left 08/10/2016   Procedure: LEFT KNEE UNICOMPARTMENTAL ARTHROPLASTY;  Surgeon: Dempsey Moan, MD;  Location: [facility];  Service: Orthopedics;  Laterality: Left;   TONSILLECTOMY AND ADENOIDECTOMY  1949   TOTAL KNEE ARTHROPLASTY Right 07/19/2021   Procedure: TOTAL KNEE  ARTHROPLASTY;  Surgeon: Moan Dempsey, MD;  Location: [facility];  Service: Orthopedics;  Laterality: Right;   TREATMENT FISTULA ANAL  1988   VAGINAL HYSTERECTOMY  1984   Patient Active Problem List   Diagnosis Date Noted   Hypercoagulable state due to paroxysmal atrial fibrillation (HCC) 03/01/2024   Primary osteoarthritis of right knee 07/19/2021   Paroxysmal atrial fibrillation (HCC) 07/19/2019   GERD (gastroesophageal reflux disease) 09/25/2018   Adhesive capsulitis of right shoulder 07/16/2018   Pain in joint of right shoulder 06/18/2018   BMI 34.0-34.9,adult 12/01/2016   Obesity, Class I, BMI 30-34.9 12/01/2016   History of right knee joint replacement 08/31/2016   Bilateral primary osteoarthritis of knee 02/23/2016   Hyperglycemia 10/14/2015   Primary osteoarthritis of knee 08/20/2015   Palpitation 04/16/2015   Primary insomnia 07/24/2014   Osteoporosis 02/27/2013   Generalized anxiety disorder 08/24/2012   Restless leg syndrome 07/30/2012   Knee pain, right 07/13/2012   OSA (obstructive sleep apnea) 07/04/2012   Benign colon polyp 11/25/2011   Osteopenia 11/25/2011   Hypothyroidism 10/18/2011   Chest pain 09/01/2011   Hyperlipidemia 02/02/2009   ATRIAL FIBRILLATION 02/02/2009  PCP: [facility]  REFERRING PROVIDER: Lonni CINDERELLA Poli, MD   REFERRING DIAG: [phone] (ICD-10-CM) - Chronic pain of right knee  THERAPY DIAG:  Chronic pain of right knee  Muscle weakness (generalized)  Other abnormalities of gait and mobility  Rationale for Evaluation and Treatment: Rehabilitation  ONSET DATE: at least one year since the beginning of her pain  SUBJECTIVE:   SUBJECTIVE STATEMENT: Patient noted clicking on immediate ambulation, ascending and descending stairs, and catches when she sits still for too long. Patient noting soreness.   PERTINENT HISTORY: Patient has undergone a partial TKA on L knee in 2017 and a full TKA on R knee  in 2020. Patient notes that she has been experiencing a patellar clunk, crepitus, and a feeling of her femur and tibia being stuck on each other in the R knee that began at least one year ago. She recently received a cortisone shot in the R knee that did not assist with her pain levels. She has difficulty with sitting for prolonged periods of time and riding in the car for long periods of time where she begins to experience burning in the knee after 20-30 minutes. Patient endorses recent weight loss and increase in physical activity which she believes may have increased her pain. Patient also mentions that she may have neuropathy in a few toes on her R foot.    PAIN:  Today pain about the same. Are you having pain? Yes: NPRS scale: 0/10 when seated and resting; 7-8/10 at worst  Pain location: entire knee; when caught it feels like the tibia and femur are stuck together   Pain description: burning Aggravating factors: car rides, prolonged sitting in one position Relieving factors: mobility, tylenol    PRECAUTIONS: None  RED FLAGS: None   WEIGHT BEARING RESTRICTIONS: No  FALLS:  Has patient fallen in last 6 months? No  LIVING ENVIRONMENT: Lives with: lives with their spouse Lives in: House/apartment Stairs: Yes: External: 8 going into backyard, 5 into house, 2 into side entrance steps; can reach both Has following equipment at home: Single point cane, Walker - 4 wheeled, Crutches, and Grab bars  OCCUPATION: retired   PLOF: Independent  PATIENT GOALS: improve balance, less painful  NEXT MD VISIT: August   OBJECTIVE:  Note: Objective measures were completed at Evaluation unless otherwise noted.  DIAGNOSTIC FINDINGS: 2 views of the right knee show well-seated total knee arthroplasty with no  complicating features.  There are small areas of calcification at the  superior and inferior poles of the patella component.  There is no  effusion.  The alignment is anatomic.  PATIENT  SURVEYS:  PSFS: THE PATIENT SPECIFIC FUNCTIONAL SCALE  Place score of 0-10 (0 = unable to perform activity and 10 = able to perform activity at the same level as before injury or problem)  Activity Date: 06/06/2024 06/21/2024   Sitting for long periods of time  5 8   2. Riding in the car for long periods of time  4 4   3. Walking upstairs  5 4   4. Walking downstairs  5 4   5. Balance  4 8   Total Score 4.6 5.6     Total Score = Sum of activity scores/number of activities  Minimally Detectable Change: 3 points (for single activity); 2 points (for average score)  [facility] (nd). The Patient Specific Functional Scale . Retrieved from [email or web address]   COGNITION: Overall cognitive status: Within functional limits for tasks  assessed     SENSATION: Light touch: WFL  EDEMA: No edema noted on eval  MUSCLE LENGTH: Not assessed this date   POSTURE: No Significant postural limitations  PALPATION: TTP at R medial joint line and medial femoral condyle; patella has appropriate movement medial<>lateral, however, decreased movement with superior<>inferior movement  LOWER EXTREMITY ROM:  ROM Right Eval 06/06/2024 Left Eval 06/06/2024 Right 06/21/2024  Hip flexion A: 110 P: 119 supine A: 112 P: 125 supine A: 112 P: 122 Supine   Hip extension     Hip abduction     Hip adduction     Hip internal rotation     Hip external rotation     Knee flexion A: 103 Seated;  Unable to perform PROM 2/2 pain A: 120 Seated; unable to perform PROM 2/2 pain A: 110deg P: 113deg  Knee extension A: 3deg of flexion A: 2deg of flexion A: 0deg    Ankle dorsiflexion     Ankle plantarflexion     Ankle inversion     Ankle eversion      (Blank rows = not tested)  LOWER EXTREMITY MMT:  MMT Right Eval 06/06/2024 Left Eval 06/06/2024 Right 06/21/2024  Hip flexion 3+/5 seated 4-/5 seated 4-/5  Hip extension 3+/5 prone  3/5 prone 4-/5  Hip abduction 4/5 sidelying 4+5 sidelying 4+/5  Hip adduction     Hip internal rotation     Hip external rotation     Knee flexion 5/5 seated 5/5 seated   Knee extension 5/5 seated 5/5 seated   Ankle dorsiflexion     Ankle plantarflexion     Ankle inversion     Ankle eversion      (Blank rows = not tested)  LOWER EXTREMITY SPECIAL TESTS:  None performed on eval  FUNCTIONAL TESTS:  None performed on eval  GAIT: Distance walked: not formally assessed  Assistive device utilized: None Level of assistance: Complete Independence Comments: no overt gait deviation noted upon eval; will need a more in depth evaluation                                                                                                                                 TREATMENT DATE:  07/12/2024 TherEx:  Nustep seat 5, level 5, 8 minutes  Bilat leg press 3x10 with 106#  Single leg leg press 2x10 with 68# RDLs with 12# kb 1x5, 15# 1x5 then 1x10  With fatigue, patient weightbearing more toward Lt LE  Discussed and updated HEP to include more strengthening and balance challenges   Neuro Re-Ed:  Lateral walks with green TB around knees to challenge balance in different plane 3x10 each direction Fwd/bckwd on balance board in parallel bars with SBA to challenge ankle strategy 3x30s with heavy use of UE to self correct balance  Side to side on balance board in parallel bars with SBA to challenge ankle strategy 3x30s with improved performance compared to fwd/bckwd, though  continued to overuse bilat UE for self correcting balance    07/05/2024 TherEx:  Nustep seat 5, level 3, 9 minutes  Lateral step ups with 6 step 2x10 each leg intermittently using UE support for balance control Discussed plan of care and update to HEP for transition to discharge after last two episodes   Neuro Re-Ed: Step up and over 6 step 1x10 (rated: easy) Tap down from 6 step 2x10 each leg intermittently using UE  support for balance control Tandem stance while tossing and catching ball 3x20s each leg  Discussion of vertigo and Epley maneuver    06/28/2024 TherEx:  Recumbent bike seat 4 level 1 8 minutes (to be increased next session) Slant board gastroc stretch 3x30s  Lateral walks with red TB 3x10 each direction  RDLs for glute strength 1x3 with 5# KB (too easy), 2x8 with 10# KB   Neuro Re-Ed:  Step up and over 6 step leading with R going up and L going down, 1x10  Assessment of stairs; 2x ascending and descending lobby stairs  PT discussed future plans of improving endurance and motor control of Rt LE Forward tap downs from 6 step to improve eccentric control of Rt LE  Single leg taps to light up pods 3x30s with 4 in front, 3x30s with 2 in front and 2 behind in a square    06/21/2024 TherEx:  Recumbent bike seat 4 level 1 8 minutes  Supine figure 4 stretch 3x30s each side  Supine glute bridge with abduction using red TB 3x10 Supine hamstring stretch with strap 2x30s each leg  Supine ITB stretch with strap 2x30s each leg  Bilat leg press 2x10 with 75#, 2x10 with 100#   Physical Performance:  MMT, ROM, PSFS completed for prior to MD visit     PATIENT EDUCATION:  Education details: HEP, daily physical activity, taking mobility breaks during prolonged sitting, hydration Person educated: Patient Education method: Explanation, Demonstration, Verbal cues, and Handouts Education comprehension: verbalized understanding and returned demonstration  HOME EXERCISE PROGRAM: Access Code: [email or web address] URL: [email or web address] Date: 07/12/2024 Prepared by: Susannah Daring  Exercises - Seated Figure 4 Piriformis Stretch  - 1 x daily - 7 x weekly - 3 sets - 30 hold - Mini Squat with Counter Support  - 1 x daily - 7 x weekly - 3 sets - 10 reps - Hooklying Single Knee to Chest Stretch  - 1 x daily - 7 x weekly - 3 sets - 30 hold - Supine Bridge with Resistance Band  - 1 x daily - 7 x  weekly - 3 sets - 10 reps - Side Stepping with Resistance at Thighs  - 1 x daily - 7 x weekly - 3 sets - 10 reps - Tandem Stance in Corner  - 1 x daily - 7 x weekly - 3 sets - 30 hold - Supine ITB Stretch with Strap  - 1 x daily - 7 x weekly - 3 sets - 30 hold - Supine Hamstring Stretch with Strap  - 1 x daily - 7 x weekly - 3 sets - 30 hold  ASSESSMENT:  CLINICAL IMPRESSION: Patient arrived to session noting an increase in crepitus and popping in Rt knee as well as instances of vertigo over last 48 hours, though improved throughout the day prior to session. Patient tolerated all activities, though more time spent on balance reactions and strategies is warranted. Patient endorsed interest in potential canalith repositioning to assist with vertigo at next session. Patient will continue to benefit  from skilled PT.   OBJECTIVE IMPAIRMENTS: decreased balance, decreased mobility, decreased ROM, decreased strength, hypomobility, impaired perceived functional ability, impaired flexibility, and pain.   ACTIVITY LIMITATIONS: bending, squatting, and stairs  PARTICIPATION LIMITATIONS: driving and community activity  PERSONAL FACTORS: Fitness, Time since onset of injury/illness/exacerbation, and 3+ comorbidities: arthritis, hyperlipidemia, GERD, osteopenia are also affecting patient's functional outcome.   REHAB POTENTIAL: Good  CLINICAL DECISION MAKING: Evolving/moderate complexity  EVALUATION COMPLEXITY: Moderate   GOALS: Goals reviewed with patient? Yes  SHORT TERM GOALS: Target date: 06/27/2024 Patient will show compliance with HEP in order to maintain improvements made within PT.  Baseline: Goal status: GOAL MET 06/21/2024  2.  Patient will report decreased pain levels of </= 4/10 with prolonged sitting in order to improve quality of life with travel.  Baseline:  Goal status: ONGOING; patient endorses ability to sit for longer periods of time, but not in the car   LONG TERM GOALS: Target  date: 08/01/2024  Patient will show independence with HEP in order to maintain and progress improvement made within PT.  Baseline:  Goal status: INITIAL  2.  Patient will report increase in PSFS to at least 6.6 in order to show a significant decrease in perceived disability with functional tasks.  Baseline:  Goal status: INITIAL  3.  Patient will increase R and L hip extension MMT to at least 4/5 to show biomechanical improvements in functional tasks, like squatting.  Baseline:  Goal status: INITIAL  4.  Patient will increase R knee flexion AROM to at least 120 deg in order improve functional tasks.  Baseline:  Goal status: INITIAL  5.  Patient will report decreased pain levels of </= 2/10 with prolonged sitting in order to improve quality of life with travel. Baseline:  Goal status: INITIAL    PLAN:  PT FREQUENCY: 1-2x/week  PT DURATION: 8 weeks  PLANNED INTERVENTIONS: 97164- PT Re-evaluation, 97750- Physical Performance Testing, 97110-Therapeutic exercises, 97530- Therapeutic activity, [id number]- Neuromuscular re-education, 97535- Self Care, [id number]- Manual therapy, [id number]- Gait training, [phone]- Electrical stimulation (unattended), [phone]- Electrical stimulation (manual), [id number]- Vasopneumatic device, [id number]- Ultrasound, [id number]- Traction (mechanical), [id number]- Ionotophoresis 4mg /ml Dexamethasone , [id number] (1-2 muscles), [id number] (3+ muscles)- Dry Needling, Patient/Family education, Balance training, Stair training, Joint mobilization, Cryotherapy, and Moist heat  PLAN FOR NEXT SESSION:  Epley (?), update HEP, Continue with focus on LE stretching and balance exercises, hip flexibility, eccentric LE and hip strengthening    Susannah Daring, PT, DPT 07/12/24 2:55 PM

## 2024-07-12 ENCOUNTER — Ambulatory Visit

## 2024-07-12 DIAGNOSIS — G8929 Other chronic pain: Secondary | ICD-10-CM

## 2024-07-12 DIAGNOSIS — M25561 Pain in right knee: Secondary | ICD-10-CM

## 2024-07-12 DIAGNOSIS — R2689 Other abnormalities of gait and mobility: Secondary | ICD-10-CM

## 2024-07-12 DIAGNOSIS — M6281 Muscle weakness (generalized): Secondary | ICD-10-CM

## 2024-07-18 ENCOUNTER — Ambulatory Visit (HOSPITAL_COMMUNITY)
Admission: RE | Admit: 2024-07-18 | Discharge: 2024-07-18 | Disposition: A | Discharge status: Home / Self Care | Source: Ambulatory Visit | Attending: Physician Assistant | Admitting: Physician Assistant

## 2024-07-18 VITALS — BP 124/70 | HR 66 | Ht 61.5 in | Wt 153.6 lb

## 2024-07-18 DIAGNOSIS — I480 Paroxysmal atrial fibrillation: Secondary | ICD-10-CM | POA: Insufficient documentation

## 2024-07-18 DIAGNOSIS — I4891 Unspecified atrial fibrillation: Secondary | ICD-10-CM | POA: Diagnosis present

## 2024-07-18 DIAGNOSIS — D6869 Other thrombophilia: Secondary | ICD-10-CM | POA: Insufficient documentation

## 2024-07-18 MED ORDER — METOPROLOL SUCCINATE ER 25 MG PO TB24: 25.0000 mg | ORAL_TABLET | Freq: Every day | ORAL | 2 refills | Status: DC

## 2024-07-18 NOTE — Therapy (Signed)
 OUTPATIENT PHYSICAL THERAPY LOWER EXTREMITY TREATMENT   Patient Name: Frances Smith MRN: [id number] DOB:Sep 03, 1946, 78 y.o., female Today's Date: 07/18/2024  END OF SESSION:      Past Medical History:  Diagnosis Date   Anal fissure    Anxiety    Arthritis    Atrial fibrillation (HCC)    Colon polyps    Depression    Diverticulosis    Dysrhythmia    GERD (gastroesophageal reflux disease)    H/O: hysterectomy    Heart murmur    as a child   History of bronchitis as a child    History of head injury    History of measles    History of mumps    Hyperlipidemia    Hypothyroidism    IBS (irritable bowel syndrome)    Menopause    OSA on CPAP    Osteopenia    Paroxysmal atrial fibrillation (HCC)    Restless leg syndrome    Thyroid  disease    Venous insufficiency    right leg   Past Surgical History:  Procedure Laterality Date   ATRIAL FIBRILLATION ABLATION N/A 07/19/2019   Procedure: ATRIAL FIBRILLATION ABLATION;  Surgeon: Kelsie Agent, MD;  Location: [facility];  Service: Cardiovascular;  Laterality: N/A;   BREAST CYST ASPIRATION     multiple but not sure when   BREAST CYST INCISION AND DRAINAGE Left 1985   BREAST SURGERY     aspiration of cyst / left    COLONOSCOPY  2010   ENDOMETRIAL ABLATION     PARTIAL KNEE ARTHROPLASTY Left 08/10/2016   Procedure: LEFT KNEE UNICOMPARTMENTAL ARTHROPLASTY;  Surgeon: Dempsey Moan, MD;  Location: [facility];  Service: Orthopedics;  Laterality: Left;   TONSILLECTOMY AND ADENOIDECTOMY  1949   TOTAL KNEE ARTHROPLASTY Right 07/19/2021   Procedure: TOTAL KNEE ARTHROPLASTY;  Surgeon: Moan Dempsey, MD;  Location: [facility];  Service: Orthopedics;  Laterality: Right;   TREATMENT FISTULA ANAL  1988   VAGINAL HYSTERECTOMY  1984   Patient Active Problem List   Diagnosis Date Noted   Hypercoagulable state due to paroxysmal atrial fibrillation (HCC) 03/01/2024   Primary osteoarthritis of right knee 07/19/2021   Paroxysmal atrial  fibrillation (HCC) 07/19/2019   GERD (gastroesophageal reflux disease) 09/25/2018   Adhesive capsulitis of right shoulder 07/16/2018   Pain in joint of right shoulder 06/18/2018   BMI 34.0-34.9,adult 12/01/2016   Obesity, Class I, BMI 30-34.9 12/01/2016   History of right knee joint replacement 08/31/2016   Bilateral primary osteoarthritis of knee 02/23/2016   Hyperglycemia 10/14/2015   Primary osteoarthritis of knee 08/20/2015   Palpitation 04/16/2015   Primary insomnia 07/24/2014   Osteoporosis 02/27/2013   Generalized anxiety disorder 08/24/2012   Restless leg syndrome 07/30/2012   Knee pain, right 07/13/2012   OSA (obstructive sleep apnea) 07/04/2012   Benign colon polyp 11/25/2011   Osteopenia 11/25/2011   Hypothyroidism 10/18/2011   Chest pain 09/01/2011   Hyperlipidemia 02/02/2009   ATRIAL FIBRILLATION 02/02/2009    PCP: [facility]  REFERRING PROVIDER: Lonni CINDERELLA Poli, MD   REFERRING DIAG: [phone] (ICD-10-CM) - Chronic pain of right knee  THERAPY DIAG:  No diagnosis found.  Rationale for Evaluation and Treatment: Rehabilitation  ONSET DATE: at least one year since the beginning of her pain  SUBJECTIVE:   SUBJECTIVE STATEMENT: ***  Patient noted clicking on immediate ambulation, ascending and descending stairs, and catches when she sits still for too long. Patient noting soreness.   PERTINENT  HISTORY: Patient has undergone a partial TKA on L knee in 2017 and a full TKA on R knee in 2020. Patient notes that she has been experiencing a patellar clunk, crepitus, and a feeling of her femur and tibia being stuck on each other in the R knee that began at least one year ago. She recently received a cortisone shot in the R knee that did not assist with her pain levels. She has difficulty with sitting for prolonged periods of time and riding in the car for long periods of time where she begins to experience burning in the knee  after 20-30 minutes. Patient endorses recent weight loss and increase in physical activity which she believes may have increased her pain. Patient also mentions that she may have neuropathy in a few toes on her R foot.    PAIN:  Today pain about the same. Are you having pain? Yes: NPRS scale: 0/10 when seated and resting; 7-8/10 at worst  Pain location: entire knee; when caught it feels like the tibia and femur are stuck together   Pain description: burning Aggravating factors: car rides, prolonged sitting in one position Relieving factors: mobility, tylenol    PRECAUTIONS: None  RED FLAGS: None   WEIGHT BEARING RESTRICTIONS: No  FALLS:  Has patient fallen in last 6 months? No  LIVING ENVIRONMENT: Lives with: lives with their spouse Lives in: House/apartment Stairs: Yes: External: 8 going into backyard, 5 into house, 2 into side entrance steps; can reach both Has following equipment at home: Single point cane, Walker - 4 wheeled, Crutches, and Grab bars  OCCUPATION: retired   PLOF: Independent  PATIENT GOALS: improve balance, less painful  NEXT MD VISIT: August   OBJECTIVE:  Note: Objective measures were completed at Evaluation unless otherwise noted.  DIAGNOSTIC FINDINGS: 2 views of the right knee show well-seated total knee arthroplasty with no  complicating features.  There are small areas of calcification at the  superior and inferior poles of the patella component.  There is no  effusion.  The alignment is anatomic.  PATIENT SURVEYS:  PSFS: THE PATIENT SPECIFIC FUNCTIONAL SCALE  Place score of 0-10 (0 = unable to perform activity and 10 = able to perform activity at the same level as before injury or problem)  Activity Date: 06/06/2024 06/21/2024   Sitting for long periods of time  5 8   2. Riding in the car for long periods of time  4 4   3. Walking upstairs  5 4   4. Walking downstairs  5 4   5. Balance  4 8   Total Score 4.6 5.6     Total Score = Sum of  activity scores/number of activities  Minimally Detectable Change: 3 points (for single activity); 2 points (for average score)  [facility] (nd). The Patient Specific Functional Scale . Retrieved from [email or web address]   COGNITION: Overall cognitive status: Within functional limits for tasks assessed     SENSATION: Light touch: WFL  EDEMA: No edema noted on eval  MUSCLE LENGTH: Not assessed this date   POSTURE: No Significant postural limitations  PALPATION: TTP at R medial joint line and medial femoral condyle; patella has appropriate movement medial<>lateral, however, decreased movement with superior<>inferior movement  LOWER EXTREMITY ROM:  ROM Right Eval 06/06/2024 Left Eval 06/06/2024 Right 06/21/2024  Hip flexion A: 110 P: 119 supine A: 112 P: 125 supine A: 112 P: 122 Supine   Hip extension     Hip abduction  Hip adduction     Hip internal rotation     Hip external rotation     Knee flexion A: 103 Seated;  Unable to perform PROM 2/2 pain A: 120 Seated; unable to perform PROM 2/2 pain A: 110deg P: 113deg  Knee extension A: 3deg of flexion A: 2deg of flexion A: 0deg    Ankle dorsiflexion     Ankle plantarflexion     Ankle inversion     Ankle eversion      (Blank rows = not tested)  LOWER EXTREMITY MMT:  MMT Right Eval 06/06/2024 Left Eval 06/06/2024 Right 06/21/2024  Hip flexion 3+/5 seated 4-/5 seated 4-/5  Hip extension 3+/5 prone 3/5 prone 4-/5  Hip abduction 4/5 sidelying 4+5 sidelying 4+/5  Hip adduction     Hip internal rotation     Hip external rotation     Knee flexion 5/5 seated 5/5 seated   Knee extension 5/5 seated 5/5 seated   Ankle dorsiflexion     Ankle plantarflexion     Ankle inversion     Ankle eversion      (Blank rows = not tested)  LOWER EXTREMITY SPECIAL TESTS:  None performed on eval  FUNCTIONAL TESTS:  None performed on  eval  GAIT: Distance walked: not formally assessed  Assistive device utilized: None Level of assistance: Complete Independence Comments: no overt gait deviation noted upon eval; will need a more in depth evaluation                                                                                                                                 TREATMENT DATE:  07/19/2024 ***  07/12/2024 TherEx:  Nustep seat 5, level 5, 8 minutes  Bilat leg press 3x10 with 106#  Single leg leg press 2x10 with 68# RDLs with 12# kb 1x5, 15# 1x5 then 1x10  With fatigue, patient weightbearing more toward Lt LE  Discussed and updated HEP to include more strengthening and balance challenges   Neuro Re-Ed:  Lateral walks with green TB around knees to challenge balance in different plane 3x10 each direction Fwd/bckwd on balance board in parallel bars with SBA to challenge ankle strategy 3x30s with heavy use of UE to self correct balance  Side to side on balance board in parallel bars with SBA to challenge ankle strategy 3x30s with improved performance compared to fwd/bckwd, though continued to overuse bilat UE for self correcting balance    07/05/2024 TherEx:  Nustep seat 5, level 3, 9 minutes  Lateral step ups with 6 step 2x10 each leg intermittently using UE support for balance control Discussed plan of care and update to HEP for transition to discharge after last two episodes   Neuro Re-Ed: Step up and over 6 step 1x10 (rated: easy) Tap down from 6 step 2x10 each leg intermittently using UE support for balance control Tandem stance while tossing and catching ball 3x20s each leg  Discussion of  vertigo and Epley maneuver    06/28/2024 TherEx:  Recumbent bike seat 4 level 1 8 minutes (to be increased next session) Slant board gastroc stretch 3x30s  Lateral walks with red TB 3x10 each direction  RDLs for glute strength 1x3 with 5# KB (too easy), 2x8 with 10# KB   Neuro Re-Ed:  Step up and over 6  step leading with R going up and L going down, 1x10  Assessment of stairs; 2x ascending and descending lobby stairs  PT discussed future plans of improving endurance and motor control of Rt LE Forward tap downs from 6 step to improve eccentric control of Rt LE  Single leg taps to light up pods 3x30s with 4 in front, 3x30s with 2 in front and 2 behind in a square     PATIENT EDUCATION:  Education details: HEP, daily physical activity, taking mobility breaks during prolonged sitting, hydration Person educated: Patient Education method: Explanation, Demonstration, Verbal cues, and Handouts Education comprehension: verbalized understanding and returned demonstration  HOME EXERCISE PROGRAM: Access Code: [email or web address] URL: [email or web address] Date: 07/12/2024 Prepared by: Susannah Daring  Exercises - Seated Figure 4 Piriformis Stretch  - 1 x daily - 7 x weekly - 3 sets - 30 hold - Mini Squat with Counter Support  - 1 x daily - 7 x weekly - 3 sets - 10 reps - Hooklying Single Knee to Chest Stretch  - 1 x daily - 7 x weekly - 3 sets - 30 hold - Supine Bridge with Resistance Band  - 1 x daily - 7 x weekly - 3 sets - 10 reps - Side Stepping with Resistance at Thighs  - 1 x daily - 7 x weekly - 3 sets - 10 reps - Tandem Stance in Corner  - 1 x daily - 7 x weekly - 3 sets - 30 hold - Supine ITB Stretch with Strap  - 1 x daily - 7 x weekly - 3 sets - 30 hold - Supine Hamstring Stretch with Strap  - 1 x daily - 7 x weekly - 3 sets - 30 hold  ASSESSMENT:  CLINICAL IMPRESSION: ***  Patient arrived to session noting an increase in crepitus and popping in Rt knee as well as instances of vertigo over last 48 hours, though improved throughout the day prior to session. Patient tolerated all activities, though more time spent on balance reactions and strategies is warranted. Patient endorsed interest in potential canalith repositioning to assist with vertigo at next session. Patient will  continue to benefit from skilled PT.   OBJECTIVE IMPAIRMENTS: decreased balance, decreased mobility, decreased ROM, decreased strength, hypomobility, impaired perceived functional ability, impaired flexibility, and pain.   ACTIVITY LIMITATIONS: bending, squatting, and stairs  PARTICIPATION LIMITATIONS: driving and community activity  PERSONAL FACTORS: Fitness, Time since onset of injury/illness/exacerbation, and 3+ comorbidities: arthritis, hyperlipidemia, GERD, osteopenia are also affecting patient's functional outcome.   REHAB POTENTIAL: Good  CLINICAL DECISION MAKING: Evolving/moderate complexity  EVALUATION COMPLEXITY: Moderate   GOALS: Goals reviewed with patient? Yes  SHORT TERM GOALS: Target date: 06/27/2024 Patient will show compliance with HEP in order to maintain improvements made within PT.  Baseline: Goal status: GOAL MET 06/21/2024  2.  Patient will report decreased pain levels of </= 4/10 with prolonged sitting in order to improve quality of life with travel.  Baseline:  Goal status: ONGOING; patient endorses ability to sit for longer periods of time, but not in the car   LONG TERM GOALS: Target date: 08/01/2024  Patient  will show independence with HEP in order to maintain and progress improvement made within PT.  Baseline:  Goal status: INITIAL  2.  Patient will report increase in PSFS to at least 6.6 in order to show a significant decrease in perceived disability with functional tasks.  Baseline:  Goal status: INITIAL  3.  Patient will increase R and L hip extension MMT to at least 4/5 to show biomechanical improvements in functional tasks, like squatting.  Baseline:  Goal status: INITIAL  4.  Patient will increase R knee flexion AROM to at least 120 deg in order improve functional tasks.  Baseline:  Goal status: INITIAL  5.  Patient will report decreased pain levels of </= 2/10 with prolonged sitting in order to improve quality of life with  travel. Baseline:  Goal status: INITIAL    PLAN:  PT FREQUENCY: 1-2x/week  PT DURATION: 8 weeks  PLANNED INTERVENTIONS: 97164- PT Re-evaluation, 97750- Physical Performance Testing, 97110-Therapeutic exercises, 97530- Therapeutic activity, [id number]- Neuromuscular re-education, 97535- Self Care, [id number]- Manual therapy, [id number]- Gait training, [phone]- Electrical stimulation (unattended), [phone]- Electrical stimulation (manual), [id number]- Vasopneumatic device, [id number]- Ultrasound, [id number]- Traction (mechanical), [id number]- Ionotophoresis 4mg /ml Dexamethasone , 79439 (1-2 muscles), 20561 (3+ muscles)- Dry Needling, Patient/Family education, Balance training, Stair training, Joint mobilization, Cryotherapy, and Moist heat  PLAN FOR NEXT SESSION:  *** Epley (?), update HEP, Continue with focus on LE stretching and balance exercises, hip flexibility, eccentric LE and hip strengthening    Susannah Daring, PT, DPT 07/18/24 9:05 AM

## 2024-07-18 NOTE — Progress Notes (Signed)
 Primary Care Physician: [facility] (Inactive) Primary Cardiologist: None Electrophysiologist: OLE ONEIDA HOLTS, MD  Referring Physician: Charlies Arthur PA   Frances Smith is a 78 y.o. female with a history of HLD, hypothyroidism, OSA, atrial flutter, atrial fibrillation who presents for follow up in the [facility].  The patient was initially diagnosed with atrial fibrillation remotely and had an ablation in 1999 in [place]  and repeat afib and flutter ablation with Dr Kelsie in 2020. Her propafenone  was discontinued after her last ablation. Patient is on Eliquis  for stroke prevention.   Patient returns for follow up for atrial fibrillation. She is in SR today and feels well. She does have occasional palpitations which last for only seconds. Her Apple Watch did show one true episode of afib (personally reviewed). It also showed SR with PVCs. She has been taking metoprolol  daily. No bleeding issues on anticoagulation.   Today, she  denies symptoms of chest pain, shortness of breath, orthopnea, PND, lower extremity edema, dizziness, presyncope, syncope, snoring, daytime somnolence, bleeding, or neurologic sequela. The patient is tolerating medications without difficulties and is otherwise without complaint today.    Atrial Fibrillation Risk Factors:  she does have symptoms or diagnosis of sleep apnea. she does not have a history of rheumatic fever.   Atrial Fibrillation Management history:  Previous antiarrhythmic drugs: propafenone   Previous cardioversions: none Previous ablations: 1999 in [place] , PVI and CTI 07/19/19 Dr Kelsie Anticoagulation history: Eliquis   ROS- All systems are reviewed and negative except as per the HPI above.  Past Medical History:  Diagnosis Date   Anal fissure    Anxiety    Arthritis    Atrial fibrillation (HCC)    Colon polyps    Depression    Diverticulosis    Dysrhythmia    GERD  (gastroesophageal reflux disease)    H/O: hysterectomy    Heart murmur    as a child   History of bronchitis as a child    History of head injury    History of measles    History of mumps    Hyperlipidemia    Hypothyroidism    IBS (irritable bowel syndrome)    Menopause    OSA on CPAP    Osteopenia    Paroxysmal atrial fibrillation (HCC)    Restless leg syndrome    Thyroid  disease    Venous insufficiency    right leg    Current Outpatient Medications  Medication Sig Dispense Refill   acetaminophen  (TYLENOL ) 500 MG tablet Take 1,000 mg by mouth every 8 (eight) hours as needed for moderate pain. (Patient taking differently: Take 1,000 mg by mouth as needed for moderate pain (pain score 4-6).)     ALPRAZolam (XANAX) 0.25 MG tablet Take 0.25 mg by mouth as needed for anxiety.     apixaban  (ELIQUIS ) 5 MG TABS tablet TAKE 1 TABLET TWICE A DAY 180 tablet 1   Ascorbic Acid (VITAMIN C) 1000 MG tablet Take 1,000 mg by mouth in the morning and at bedtime.     b complex vitamins tablet Take 1 tablet by mouth daily.     Boswellia-Glucosamine-Vit D (OSTEO BI-FLEX ONE PER DAY) TABS Take 1 tablet by mouth as needed.     Cholecalciferol 50 MCG (2000 UT) TABS Take 2,000 Units by mouth daily.     DULoxetine  (CYMBALTA ) 60 MG capsule Take 60 mg by mouth daily.     famotidine  (PEPCID ) 40 MG tablet Take 40 mg  by mouth 2 (two) times daily.     MAGNESIUM  CHLORIDE-CALCIUM PO Take 1 tablet by mouth in the morning and at bedtime.     melatonin 5 MG TABS Take 5 mg by mouth at bedtime.     metoprolol  succinate (TOPROL  XL) 25 MG 24 hr tablet Take 1 tablet (25 mg total) by mouth daily as needed (afib).     Multiple Vitamins-Minerals (MULTIVITAMIN ADULT PO) Take 1 tablet by mouth daily.     pravastatin  (PRAVACHOL ) 40 MG tablet Take 40 mg by mouth daily.       thyroid  (ARMOUR) 30 MG tablet Take 30 mg by mouth daily before breakfast.     traZODone  (DESYREL ) 50 MG tablet Take 50 mg by mouth at bedtime as needed  for sleep.     ZEPBOUND 15 MG/0.5ML Pen Inject 15 mg into the skin once a week.     No current facility-administered medications for this encounter.    Physical Exam: BP 124/70   Pulse 66   Ht 5' 1.5 (1.562 m)   Wt 69.7 kg   BMI 28.55 kg/m   GEN: Well nourished, well developed in no acute distress CARDIAC: Regular rate and rhythm, no murmurs, rubs, gallops RESPIRATORY:  Clear to auscultation without rales, wheezing or rhonchi  ABDOMEN: Soft, non-tender, non-distended EXTREMITIES:  No edema; No deformity    Wt Readings from Last 3 Encounters:  07/18/24 69.7 kg  05/15/24 69 kg  04/24/24 68.9 kg     EKG today demonstrates  SR Vent. rate 66 BPM PR interval 196 ms QRS duration 78 ms QT/QTcB 386/404 ms   Echo 10/02/18 demonstrated  - Left ventricle: The cavity size was mildly reduced. Wall    thickness was increased in a pattern of mild LVH. Systolic    function was normal. The estimated ejection fraction was in the    range of 60% to 65%. Wall motion was normal; there were no    regional wall motion abnormalities. Left ventricular diastolic    function parameters were normal for the patient&'s age.  - Aortic valve: There was no regurgitation.  - Mitral valve: There was trivial regurgitation.  - Left atrium: The atrium was mildly dilated.  - Right ventricle: Systolic function was normal.  - Atrial septum: No defect or patent foramen ovale was identified.  - Tricuspid valve: There was trivial regurgitation.  - Pulmonic valve: There was no significant regurgitation.   Impressions:   - Normal LV EF, no significant diastolic dysfunction. No    significant valvular abnormalities.    CHA2DS2-VASc Score = 3  The patient's score is based upon: CHF History: 0 HTN History: 0 Diabetes History: 0 Stroke History: 0 Vascular Disease History: 0 Age Score: 2 Gender Score: 1       ASSESSMENT AND PLAN: Paroxysmal Atrial Fibrillation/atrial flutter (ICD10:  I48.0) The  patient's CHA2DS2-VASc score is 3, indicating a 3.2% annual risk of stroke.   S/p ablation 1999, afib and flutter ablation 06/2019 Patient appears to be maintaining SR Continue Eliquis  5 mg BID Continue Toprol  25 mg daily  Apple Watch for home monitoring.  If she has more frequent afib, can consider resuming propafenone  or repeat ablation.   Secondary Hypercoagulable State (ICD10:  D68.69) The patient is at significant risk for stroke/thromboembolism based upon her CHA2DS2-VASc Score of 3.  Continue Apixaban  (Eliquis ). No bleeding issues.   OSA  Not currently on CPAP after significant weight loss with Zepbound.    Follow up in the AF  clinic in 6 months.       Daril Kicks PA-C [facility] [address] [place], [place] [address] [phone]

## 2024-07-19 ENCOUNTER — Ambulatory Visit (INDEPENDENT_AMBULATORY_CARE_PROVIDER_SITE_OTHER)

## 2024-07-19 DIAGNOSIS — G8929 Other chronic pain: Secondary | ICD-10-CM | POA: Diagnosis not present

## 2024-07-19 DIAGNOSIS — M25561 Pain in right knee: Secondary | ICD-10-CM

## 2024-07-19 DIAGNOSIS — R2689 Other abnormalities of gait and mobility: Secondary | ICD-10-CM | POA: Diagnosis not present

## 2024-07-19 DIAGNOSIS — M6281 Muscle weakness (generalized): Secondary | ICD-10-CM

## 2024-08-27 ENCOUNTER — Other Ambulatory Visit (HOSPITAL_COMMUNITY): Payer: Self-pay | Admitting: *Deleted

## 2024-08-27 MED ORDER — METOPROLOL SUCCINATE ER 25 MG PO TB24: 25.0000 mg | ORAL_TABLET | Freq: Every day | ORAL | 2 refills | Status: AC

## 2024-09-23 ENCOUNTER — Encounter: Payer: Self-pay | Admitting: Radiology

## 2024-11-19 ENCOUNTER — Encounter: Payer: Self-pay | Admitting: Obstetrics and Gynecology

## 2024-11-19 ENCOUNTER — Ambulatory Visit: Admitting: Obstetrics and Gynecology

## 2024-11-19 VITALS — BP 131/78 | HR 73 | Ht 61.3 in | Wt 146.6 lb

## 2024-11-19 DIAGNOSIS — N3281 Overactive bladder: Secondary | ICD-10-CM | POA: Diagnosis not present

## 2024-11-19 DIAGNOSIS — R350 Frequency of micturition: Secondary | ICD-10-CM

## 2024-11-19 DIAGNOSIS — R159 Full incontinence of feces: Secondary | ICD-10-CM

## 2024-11-19 DIAGNOSIS — N393 Stress incontinence (female) (male): Secondary | ICD-10-CM | POA: Diagnosis not present

## 2024-11-19 LAB — POCT URINALYSIS DIP (CLINITEK)
Bilirubin, UA: NEGATIVE
Blood, UA: NEGATIVE
Glucose, UA: NEGATIVE mg/dL
Ketones, POC UA: NEGATIVE mg/dL
Leukocytes, UA: NEGATIVE
Nitrite, UA: NEGATIVE
POC PROTEIN,UA: NEGATIVE
Spec Grav, UA: 1.015
Urobilinogen, UA: 0.2 U/dL
pH, UA: 7

## 2024-11-19 MED ORDER — TROSPIUM CHLORIDE 20 MG PO TABS: 20.0000 mg | ORAL_TABLET | Freq: Two times a day (BID) | ORAL | 5 refills | Status: AC

## 2024-11-19 NOTE — Patient Instructions (Addendum)
 Today we talked about ways to manage bladder urgency such as altering your diet to avoid irritative beverages and foods (bladder diet) as well as attempting to decrease stress and other exacerbating factors.    The Most Bothersome Foods* The Least Bothersome Foods*  Coffee - Regular & Decaf Tea - caffeinated Carbonated beverages - cola, non-colas, diet & caffeine-free Alcohols - Beer, Red Wine, White Wine, [address] - Grapefruit, [place], Orange, [employer] - Cranberry, Grapefruit, Orange, Pineapple Vegetables - Tomato & Tomato Products Flavor Enhancers - Hot peppers, Spicy foods, Chili, Horseradish, Vinegar, Monosodium glutamate (MSG) Artificial Sweeteners - NutraSweet, Sweet 'N Low, Equal (sweetener), Saccharin Ethnic foods - Mexican, Thai, Indian food Water  Milk - low-fat & whole Fruits - Bananas, Blueberries, Honeydew melon, Pears, Raisins, Watermelon Vegetables - Broccoli, [address] Sprouts, [place], [place], Cauliflower, [place], Cucumber, Mushrooms, Peas, Radishes, Squash, Zucchini, White potatoes, Sweet potatoes & yams Poultry - Chicken, Eggs, Turkey, [employer] - Beef, [employer] Operational [employer], Lamb Seafood - Shrimp, [place] fish, Salmon Grains - Oat, Rice Snacks - Pretzels, Popcorn  *Mitch ALF et al. Diet and its role in interstitial cystitis/bladder pain syndrome (IC/BPS) and comorbid conditions. [employer]. BJU Int. 2012 Jan 11.   We discussed the symptoms of overactive bladder (OAB), which include urinary urgency, urinary frequency, night-time urination, with or without urge incontinence.  We discussed management including behavioral therapy (decreasing bladder irritants by following a bladder diet, urge suppression strategies, timed voids, bladder retraining), physical therapy, medication; and for refractory cases posterior tibial nerve stimulation, sacral neuromodulation, and intravesical botulinum toxin injection.   A prescription for Trospium 20mg  twice a day was sent to the  pharmacy.   For treatment of stress urinary incontinence, which is leakage with physical activity/movement/strainging/coughing, we discussed expectant management versus nonsurgical options versus surgery. Nonsurgical options include weight loss, physical therapy, as well as a pessary.  Surgical options include a midurethral sling, which is a synthetic mesh sling that acts like a hammock under the urethra to prevent leakage of urine,  and transurethral injection of a bulking agent.   Accidental Bowel Leakage: Our goal is to achieve formed bowel movements daily or every-other-day without leakage.  You may need to try different combinations of the following options to find what works best for you.  Some management options include: Dietary changes (more leafy greens, vegetables and fruits; less processed foods) Fiber supplementation (Metamucil or something with psyllium as active ingredient) or Benefiber- add daily  Over-the-counter imodium (tablets or liquid) to help solidify the stool and prevent leakage of stool. If you get constipated you can use Miralax  as needed to achieve bowel movements.

## 2024-11-19 NOTE — Assessment & Plan Note (Signed)
-   For treatment of stress urinary incontinence,  non-surgical options include expectant management, weight loss, physical therapy, as well as a pessary.  Surgical options include a midurethral sling,  and transurethral injection of a bulking agent. - OAB is more bothersome for her, so she wants to focus on those symptoms first.

## 2024-11-19 NOTE — Assessment & Plan Note (Signed)
 We discussed the symptoms of overactive bladder (OAB), which include urinary urgency, urinary frequency, nocturia, with or without urge incontinence.  While we do not know the exact etiology of OAB, several treatment options exist. We discussed management including behavioral therapy (decreasing bladder irritants, urge suppression strategies, timed voids, bladder retraining), physical therapy, medication; for refractory cases posterior tibial nerve stimulation, sacral neuromodulation, and intravesical botulinum toxin injection.  - Oxybutynin is not the best medication due to side effects. For anticholinergic medications, we discussed the potential side effects of anticholinergics including dry eyes, dry mouth, constipation, cognitive impairment and urinary retention. Will prescribe trospium 20mg  BID as this has less of a risk of cognitive dysfunction. She is not a candidate for myrbetriq due to beta blocker use.

## 2024-11-19 NOTE — Assessment & Plan Note (Signed)
-   Treatment options include anti-diarrhea medication (loperamide/ Imodium OTC or prescription lomotil), fiber supplements, physical therapy, and possible sacral neuromodulation or surgery.   - She will start with daily fiber supplementation for stool bulking

## 2024-11-19 NOTE — Progress Notes (Signed)
 " New Patient Evaluation and Consultation  Referring Provider: [facility] PCP: [facility] (Inactive) Date of Service: 11/19/2024  SUBJECTIVE Chief Complaint: New Patient (Initial Visit) Frances Smith is a 78 y.o. female is here for prolapse.)  History of Present Illness: Frances Smith is a 78 y.o. White or Caucasian female presenting for evaluation of urinary incontinence.     Urinary Symptoms: Leaks urine with cough/ sneeze, with movement to the bathroom, and with urgency Leaks 0-1 time(s) per day. Increased urgency in the last 6 months. More controlled since she started medication. SUI happens about 1/ month and is not as bothersome Pad use: 1 pads per day.   Patient is bothered by UI symptoms. Has lost weight with zepbound. Since the weight loss she has noticed more urgency. She started oxybutynin about 6 months ago. Also reports dry mouth.   Day time voids 4+.  Nocturia: 2+ times per night to void. Voiding dysfunction:  empties bladder well.  Patient does not use a catheter to empty bladder.  When urinating, patient feels she has no difficulties Drinks: 1.5 cups coffee in AM, 30oz water  per day  UTIs: 0 UTI's in the last year.   Reports history of blood in urine- many years ago had microscopic blood.   Pelvic Organ Prolapse Symptoms:                  Patient Denies a feeling of a bulge the vaginal area.   Bowel Symptom: Bowel movements: 1 time(s) per day Stool consistency: loose Straining: no.  Splinting: no.  Incomplete evacuation: no.  Patient Admits to accidental bowel leakage / fecal incontinence  Occurs: infrequently  Consistency with leakage: liquid Bowel regimen: diet  HM Colonoscopy          Completed or No Longer Recommended     Colonoscopy  Discontinued      Frequency changed to Never automatically (Topic No Longer Applies)   11/23/2018  COLONOSCOPY  Only the first 1 history entries have been loaded,  but more history exists.               Sexual Function Sexually active: no.   Pelvic Pain Denies pelvic pain   Past Medical History:  Past Medical History:  Diagnosis Date   Anal fissure    Anxiety    Arthritis    Atrial fibrillation (HCC)    Colon polyps    Depression    Diverticulosis    Dysrhythmia    GERD (gastroesophageal reflux disease)    H/O: hysterectomy    Heart murmur    as a child   History of bronchitis as a child    History of head injury    History of measles    History of mumps    Hyperlipidemia    Hypothyroidism    IBS (irritable bowel syndrome)    Menopause    OSA on CPAP    Osteopenia    Paroxysmal atrial fibrillation (HCC)    Restless leg syndrome    Thyroid  disease    Venous insufficiency    right leg     Past Surgical History:   Past Surgical History:  Procedure Laterality Date   ATRIAL FIBRILLATION ABLATION N/A 07/19/2019   Procedure: ATRIAL FIBRILLATION ABLATION;  Surgeon: Kelsie Agent, MD;  Location: [facility];  Service: Cardiovascular;  Laterality: N/A;   BREAST CYST ASPIRATION     multiple but not sure when   BREAST CYST  INCISION AND DRAINAGE Left 1985   BREAST SURGERY     aspiration of cyst / left    COLONOSCOPY  2010   ENDOMETRIAL ABLATION     PARTIAL KNEE ARTHROPLASTY Left 08/10/2016   Procedure: LEFT KNEE UNICOMPARTMENTAL ARTHROPLASTY;  Surgeon: Dempsey Moan, MD;  Location: [facility];  Service: Orthopedics;  Laterality: Left;   TONSILLECTOMY AND ADENOIDECTOMY  1949   TOTAL KNEE ARTHROPLASTY Right 07/19/2021   Procedure: TOTAL KNEE ARTHROPLASTY;  Surgeon: Moan Dempsey, MD;  Location: [facility];  Service: Orthopedics;  Laterality: Right;   TREATMENT FISTULA ANAL  1988   VAGINAL HYSTERECTOMY  1984     Past OB/GYN History: OB History  Gravida Para Term Preterm AB Living  2 2 2      SAB IAB Ectopic Multiple Live Births          # Outcome Date GA Lbr Len/2nd Weight Sex Type Anes PTL Lv  2 Term      Vag-Spont      1 Term      Vag-Forceps       S/p hysterectomy  Medications: Patient has a current medication list which includes the following prescription(s): acetaminophen , alprazolam, eliquis , vitamin c, b complex vitamins, osteo bi-flex one per day, cholecalciferol, duloxetine , famotidine , magnesium  chloride-calcium, melatonin, metoprolol  succinate, multiple vitamin, pravastatin , thyroid , trazodone , trospium, and zepbound.   Allergies: Patient is allergic to codeine.   Social History: Social History[1]  Relationship status: married Patient lives with her husband.   Patient is not employed. Regular exercise: Yes: walking 3x per week History of abuse: No  Family History:   Family History  Problem Relation Age of Onset   Heart failure Father 94   Atrial fibrillation Father    Allergies Mother    Asthma Mother    Breast cancer Mother    Colon cancer Maternal Aunt 65   Breast cancer Maternal Aunt    Colon cancer Cousin 50   Thyroid  disease Brother    Anxiety disorder Daughter    Depression Daughter    Anxiety disorder Son    Depression Son    Anxiety disorder Brother    Breast cancer Maternal Grandmother      Review of Systems: Review of Systems  Constitutional:  Positive for weight loss. Negative for fever and malaise/fatigue.  Respiratory:  Negative for cough, shortness of breath and wheezing.   Cardiovascular:  Positive for palpitations. Negative for chest pain and leg swelling.  Gastrointestinal:  Negative for abdominal pain and blood in stool.  Genitourinary:  Negative for dysuria.  Musculoskeletal:  Negative for myalgias.  Skin:  Negative for rash.  Neurological:  Positive for dizziness. Negative for headaches.  Endo/Heme/Allergies:  Does not bruise/bleed easily.  Psychiatric/Behavioral:  Negative for depression. The patient is not nervous/anxious.      OBJECTIVE Physical Exam: Vitals:   11/19/24 0851  BP: 131/78  Pulse: 73  Weight: 146 lb 9.6 oz (66.5 kg)  Height:  5' 1.3 (1.557 m)    Physical Exam Vitals reviewed. Exam conducted with a chaperone present.  Constitutional:      General: She is not in acute distress. Pulmonary:     Effort: Pulmonary effort is normal.  Abdominal:     General: There is no distension.     Palpations: Abdomen is soft.     Tenderness: There is no abdominal tenderness. There is no rebound.  Musculoskeletal:        General: No swelling. Normal range of motion.  Skin:  General: Skin is warm and dry.     Findings: No rash.  Neurological:     Mental Status: She is alert and oriented to person, place, and time.  Psychiatric:        Mood and Affect: Mood normal.        Behavior: Behavior normal.      GU / Detailed Urogynecologic Evaluation:  Pelvic Exam: Normal external female genitalia; Bartholin's and Skene's glands normal in appearance; urethral meatus normal in appearance, no urethral masses or discharge.   CST: negative VST: positive s/p hysterectomy: Speculum exam reveals normal vaginal mucosa with  atrophy and normal vaginal cuff.  Adnexa no mass, fullness, tenderness.     Pelvic floor strength III/V, puborectalis III/V external anal sphincter III/V  Pelvic floor musculature: Right levator non-tender, Right obturator non-tender, Left levator non-tender, Left obturator non-tender  POP-Q:   POP-Q  -3                                            Aa   -3                                           Ba  -7                                              C   2                                            Gh  4                                            Pb  7                                            tvl   -3                                            Ap  -3                                            Bp                                                 D      Rectal Exam:  Normal sphincter tone, external hemorroids present, no distal rectocele, enterocoele not present, no rectal masses, no sign of  dyssynergia  when asking the patient to bear down.  Post-Void Residual (PVR) by Bladder Scan: In order to evaluate bladder emptying, we discussed obtaining a postvoid residual and patient agreed to this procedure.  Procedure: The ultrasound unit was placed on the patient's abdomen in the suprapubic region after the patient had voided.    Post Void Residual - 11/19/24 0903       Post Void Residual   Post Void Residual 23 mL           Laboratory Results: Lab Results  Component Value Date   COLORU yellow 11/19/2024   CLARITYU clear 11/19/2024   GLUCOSEUR negative 11/19/2024   BILIRUBINUR negative 11/19/2024   SPECGRAV 1.015 11/19/2024   RBCUR negative 11/19/2024   PHUR 7.0 11/19/2024   PROTEINUR NEGATIVE 08/03/2016   UROBILINOGEN 0.2 11/19/2024   LEUKOCYTESUR Negative 11/19/2024    Lab Results  Component Value Date   CREATININE 0.77 07/20/2021   CREATININE 0.98 07/14/2021   CREATININE 0.78 07/16/2019    No results found for: HGBA1C  Lab Results  Component Value Date   HGB 11.9 (L) 07/20/2021     ASSESSMENT AND PLAN Ms. Schrecengost is a 78 y.o. with:  1. Overactive bladder   2. Urinary frequency   3. SUI (stress urinary incontinence, female)   4. Incontinence of feces, unspecified fecal incontinence type     Overactive bladder Assessment & Plan: We discussed the symptoms of overactive bladder (OAB), which include urinary urgency, urinary frequency, nocturia, with or without urge incontinence.  While we do not know the exact etiology of OAB, several treatment options exist. We discussed management including behavioral therapy (decreasing bladder irritants, urge suppression strategies, timed voids, bladder retraining), physical therapy, medication; for refractory cases posterior tibial nerve stimulation, sacral neuromodulation, and intravesical botulinum toxin injection.  - Oxybutynin is not the best medication due to side effects. For anticholinergic medications, we  discussed the potential side effects of anticholinergics including dry eyes, dry mouth, constipation, cognitive impairment and urinary retention. Will prescribe trospium 20mg  BID as this has less of a risk of cognitive dysfunction. She is not a candidate for myrbetriq due to beta blocker use.     Orders: -     Trospium Chloride; Take 1 tablet (20 mg total) by mouth 2 (two) times daily.  Dispense: 60 tablet; Refill: 5  Urinary frequency -     POCT URINALYSIS DIP (CLINITEK)  SUI (stress urinary incontinence, female) Assessment & Plan: - For treatment of stress urinary incontinence,  non-surgical options include expectant management, weight loss, physical therapy, as well as a pessary.  Surgical options include a midurethral sling,  and transurethral injection of a bulking agent. - OAB is more bothersome for her, so she wants to focus on those symptoms first.    Incontinence of feces, unspecified fecal incontinence type Assessment & Plan: - Treatment options include anti-diarrhea medication (loperamide/ Imodium OTC or prescription lomotil), fiber supplements, physical therapy, and possible sacral neuromodulation or surgery.   - She will start with daily fiber supplementation for stool bulking   Return 6 weeks   Rosaline LOISE Caper, MD        [1]  Social History Tobacco Use   Smoking status: Former    Current packs/day: 0.00    Average packs/day: 0.5 packs/day for 5.0 years (2.5 ttl pk-yrs)    Types: Cigarettes    Start date: 11/22/1959    Quit date: 11/21/1964    Years since quitting: 60.0    Passive exposure: Never  Smokeless tobacco: Never   Tobacco comments:    Former smoker 03/01/24  Vaping Use   Vaping status: Never Used  Substance Use Topics   Alcohol use: No   Drug use: No   "

## 2024-11-25 DIAGNOSIS — I480 Paroxysmal atrial fibrillation: Secondary | ICD-10-CM

## 2024-11-25 NOTE — Telephone Encounter (Signed)
 Eliquis  5mg  refill request received. Patient is 79 years old, weight-66.5kg, Crea-0.85 on 07/31/24 via [facility] from [facility], Diagnosis-Afib, and last seen by Quita Kicks on 07/18/24. Dose is appropriate based on dosing criteria. Will send in refill to requested pharmacy.

## 2025-01-03 ENCOUNTER — Ambulatory Visit: Admitting: Obstetrics and Gynecology

## 2025-01-16 ENCOUNTER — Ambulatory Visit (HOSPITAL_COMMUNITY): Admitting: Physician Assistant

## 2025-02-06 ENCOUNTER — Ambulatory Visit (HOSPITAL_COMMUNITY): Admitting: Physician Assistant
